# Patient Record
Sex: Female | Born: 1935 | Race: White | Hispanic: No | Marital: Married | Smoking: Former smoker
Health system: Southern US, Community
[De-identification: ages and names within clinical notes are randomized; demographics above are authoritative.]

## PROBLEM LIST (undated history)

## (undated) DIAGNOSIS — K8020 Calculus of gallbladder without cholecystitis without obstruction: Secondary | ICD-10-CM

## (undated) DIAGNOSIS — K219 Gastro-esophageal reflux disease without esophagitis: Secondary | ICD-10-CM

## (undated) DIAGNOSIS — M1990 Unspecified osteoarthritis, unspecified site: Secondary | ICD-10-CM

## (undated) DIAGNOSIS — K449 Diaphragmatic hernia without obstruction or gangrene: Secondary | ICD-10-CM

## (undated) DIAGNOSIS — E119 Type 2 diabetes mellitus without complications: Secondary | ICD-10-CM

## (undated) DIAGNOSIS — E785 Hyperlipidemia, unspecified: Secondary | ICD-10-CM

## (undated) DIAGNOSIS — I214 Non-ST elevation (NSTEMI) myocardial infarction: Secondary | ICD-10-CM

## (undated) DIAGNOSIS — K5790 Diverticulosis of intestine, part unspecified, without perforation or abscess without bleeding: Secondary | ICD-10-CM

## (undated) DIAGNOSIS — H539 Unspecified visual disturbance: Secondary | ICD-10-CM

## (undated) DIAGNOSIS — I493 Ventricular premature depolarization: Secondary | ICD-10-CM

## (undated) DIAGNOSIS — I10 Essential (primary) hypertension: Secondary | ICD-10-CM

## (undated) DIAGNOSIS — T7840XA Allergy, unspecified, initial encounter: Secondary | ICD-10-CM

## (undated) HISTORY — DX: Ventricular premature depolarization: I49.3

## (undated) HISTORY — PX: EYE SURGERY: SHX253

## (undated) HISTORY — DX: Unspecified osteoarthritis, unspecified site: M19.90

## (undated) HISTORY — DX: Allergy, unspecified, initial encounter: T78.40XA

## (undated) HISTORY — DX: Calculus of gallbladder without cholecystitis without obstruction: K80.20

## (undated) HISTORY — DX: Non-ST elevation (NSTEMI) myocardial infarction: I21.4

## (undated) HISTORY — PX: CHOLECYSTECTOMY: SHX55

## (undated) HISTORY — DX: Gastro-esophageal reflux disease without esophagitis: K21.9

## (undated) HISTORY — DX: Unspecified visual disturbance: H53.9

## (undated) HISTORY — PX: BREAST SURGERY: SHX581

## (undated) HISTORY — PX: OTHER SURGICAL HISTORY: SHX169

## (undated) HISTORY — DX: Hyperlipidemia, unspecified: E78.5

## (undated) HISTORY — DX: Essential (primary) hypertension: I10

## (undated) HISTORY — PX: FRACTURE SURGERY: SHX138

---

## 1998-01-10 ENCOUNTER — Other Ambulatory Visit: Admission: RE | Admit: 1998-01-10 | Discharge: 1998-01-10 | Payer: Self-pay | Admitting: Obstetrics & Gynecology

## 2001-12-10 ENCOUNTER — Other Ambulatory Visit: Admission: RE | Admit: 2001-12-10 | Discharge: 2001-12-10 | Payer: Self-pay | Admitting: Obstetrics & Gynecology

## 2002-12-16 ENCOUNTER — Other Ambulatory Visit: Admission: RE | Admit: 2002-12-16 | Discharge: 2002-12-16 | Payer: Self-pay | Admitting: Obstetrics & Gynecology

## 2008-10-01 DIAGNOSIS — K8020 Calculus of gallbladder without cholecystitis without obstruction: Secondary | ICD-10-CM

## 2008-10-01 HISTORY — DX: Calculus of gallbladder without cholecystitis without obstruction: K80.20

## 2011-11-12 DIAGNOSIS — E559 Vitamin D deficiency, unspecified: Secondary | ICD-10-CM | POA: Diagnosis not present

## 2011-11-12 DIAGNOSIS — R7309 Other abnormal glucose: Secondary | ICD-10-CM | POA: Diagnosis not present

## 2011-11-12 DIAGNOSIS — Z5181 Encounter for therapeutic drug level monitoring: Secondary | ICD-10-CM | POA: Diagnosis not present

## 2011-11-12 DIAGNOSIS — E7800 Pure hypercholesterolemia, unspecified: Secondary | ICD-10-CM | POA: Diagnosis not present

## 2011-11-12 DIAGNOSIS — Z79899 Other long term (current) drug therapy: Secondary | ICD-10-CM | POA: Diagnosis not present

## 2011-11-15 DIAGNOSIS — Z0000 Encounter for general adult medical examination without abnormal findings: Secondary | ICD-10-CM | POA: Diagnosis not present

## 2011-11-15 DIAGNOSIS — I10 Essential (primary) hypertension: Secondary | ICD-10-CM | POA: Diagnosis not present

## 2011-11-15 DIAGNOSIS — Z1211 Encounter for screening for malignant neoplasm of colon: Secondary | ICD-10-CM | POA: Diagnosis not present

## 2011-11-15 DIAGNOSIS — Z1339 Encounter for screening examination for other mental health and behavioral disorders: Secondary | ICD-10-CM | POA: Diagnosis not present

## 2011-11-15 DIAGNOSIS — Z124 Encounter for screening for malignant neoplasm of cervix: Secondary | ICD-10-CM | POA: Diagnosis not present

## 2011-11-15 DIAGNOSIS — L570 Actinic keratosis: Secondary | ICD-10-CM | POA: Diagnosis not present

## 2011-11-15 DIAGNOSIS — E119 Type 2 diabetes mellitus without complications: Secondary | ICD-10-CM | POA: Diagnosis not present

## 2011-11-15 DIAGNOSIS — Z1331 Encounter for screening for depression: Secondary | ICD-10-CM | POA: Diagnosis not present

## 2011-11-15 DIAGNOSIS — N959 Unspecified menopausal and perimenopausal disorder: Secondary | ICD-10-CM | POA: Diagnosis not present

## 2011-11-15 DIAGNOSIS — E7800 Pure hypercholesterolemia, unspecified: Secondary | ICD-10-CM | POA: Diagnosis not present

## 2011-12-24 DIAGNOSIS — H2510 Age-related nuclear cataract, unspecified eye: Secondary | ICD-10-CM | POA: Diagnosis not present

## 2011-12-24 DIAGNOSIS — H25019 Cortical age-related cataract, unspecified eye: Secondary | ICD-10-CM | POA: Diagnosis not present

## 2011-12-24 DIAGNOSIS — H524 Presbyopia: Secondary | ICD-10-CM | POA: Diagnosis not present

## 2012-05-12 DIAGNOSIS — Z5181 Encounter for therapeutic drug level monitoring: Secondary | ICD-10-CM | POA: Diagnosis not present

## 2012-05-12 DIAGNOSIS — E7800 Pure hypercholesterolemia, unspecified: Secondary | ICD-10-CM | POA: Diagnosis not present

## 2012-05-12 DIAGNOSIS — Z79899 Other long term (current) drug therapy: Secondary | ICD-10-CM | POA: Diagnosis not present

## 2012-05-12 DIAGNOSIS — E119 Type 2 diabetes mellitus without complications: Secondary | ICD-10-CM | POA: Diagnosis not present

## 2012-05-13 DIAGNOSIS — E119 Type 2 diabetes mellitus without complications: Secondary | ICD-10-CM | POA: Diagnosis not present

## 2012-05-13 DIAGNOSIS — E7800 Pure hypercholesterolemia, unspecified: Secondary | ICD-10-CM | POA: Diagnosis not present

## 2012-05-13 DIAGNOSIS — I10 Essential (primary) hypertension: Secondary | ICD-10-CM | POA: Diagnosis not present

## 2012-05-13 DIAGNOSIS — M549 Dorsalgia, unspecified: Secondary | ICD-10-CM | POA: Diagnosis not present

## 2012-06-11 DIAGNOSIS — Q838 Other congenital malformations of breast: Secondary | ICD-10-CM | POA: Diagnosis not present

## 2012-06-12 DIAGNOSIS — N63 Unspecified lump in unspecified breast: Secondary | ICD-10-CM | POA: Diagnosis not present

## 2012-06-12 DIAGNOSIS — N644 Mastodynia: Secondary | ICD-10-CM | POA: Diagnosis not present

## 2012-08-15 DIAGNOSIS — Z23 Encounter for immunization: Secondary | ICD-10-CM | POA: Diagnosis not present

## 2012-08-18 DIAGNOSIS — H1045 Other chronic allergic conjunctivitis: Secondary | ICD-10-CM | POA: Diagnosis not present

## 2012-09-10 DIAGNOSIS — R319 Hematuria, unspecified: Secondary | ICD-10-CM | POA: Diagnosis not present

## 2012-09-10 DIAGNOSIS — J209 Acute bronchitis, unspecified: Secondary | ICD-10-CM | POA: Diagnosis not present

## 2012-09-12 DIAGNOSIS — N63 Unspecified lump in unspecified breast: Secondary | ICD-10-CM | POA: Diagnosis not present

## 2012-12-26 DIAGNOSIS — H524 Presbyopia: Secondary | ICD-10-CM | POA: Diagnosis not present

## 2012-12-26 DIAGNOSIS — H1045 Other chronic allergic conjunctivitis: Secondary | ICD-10-CM | POA: Diagnosis not present

## 2012-12-26 DIAGNOSIS — H2510 Age-related nuclear cataract, unspecified eye: Secondary | ICD-10-CM | POA: Diagnosis not present

## 2013-02-17 DIAGNOSIS — M169 Osteoarthritis of hip, unspecified: Secondary | ICD-10-CM | POA: Diagnosis not present

## 2013-02-17 DIAGNOSIS — M25559 Pain in unspecified hip: Secondary | ICD-10-CM | POA: Diagnosis not present

## 2013-02-17 DIAGNOSIS — M549 Dorsalgia, unspecified: Secondary | ICD-10-CM | POA: Diagnosis not present

## 2013-02-17 DIAGNOSIS — M1610 Unilateral primary osteoarthritis, unspecified hip: Secondary | ICD-10-CM | POA: Diagnosis not present

## 2013-02-17 DIAGNOSIS — I10 Essential (primary) hypertension: Secondary | ICD-10-CM | POA: Diagnosis not present

## 2013-03-06 DIAGNOSIS — M25559 Pain in unspecified hip: Secondary | ICD-10-CM | POA: Diagnosis not present

## 2013-03-06 DIAGNOSIS — M810 Age-related osteoporosis without current pathological fracture: Secondary | ICD-10-CM | POA: Diagnosis not present

## 2013-03-06 DIAGNOSIS — M169 Osteoarthritis of hip, unspecified: Secondary | ICD-10-CM | POA: Diagnosis not present

## 2013-03-06 DIAGNOSIS — Z1231 Encounter for screening mammogram for malignant neoplasm of breast: Secondary | ICD-10-CM | POA: Diagnosis not present

## 2013-03-06 DIAGNOSIS — M949 Disorder of cartilage, unspecified: Secondary | ICD-10-CM | POA: Diagnosis not present

## 2013-03-10 DIAGNOSIS — R7309 Other abnormal glucose: Secondary | ICD-10-CM | POA: Diagnosis not present

## 2013-03-10 DIAGNOSIS — E7800 Pure hypercholesterolemia, unspecified: Secondary | ICD-10-CM | POA: Diagnosis not present

## 2013-03-10 DIAGNOSIS — Z5181 Encounter for therapeutic drug level monitoring: Secondary | ICD-10-CM | POA: Diagnosis not present

## 2013-03-10 DIAGNOSIS — Z79899 Other long term (current) drug therapy: Secondary | ICD-10-CM | POA: Diagnosis not present

## 2013-03-11 DIAGNOSIS — M1990 Unspecified osteoarthritis, unspecified site: Secondary | ICD-10-CM | POA: Diagnosis not present

## 2013-03-11 DIAGNOSIS — E7800 Pure hypercholesterolemia, unspecified: Secondary | ICD-10-CM | POA: Diagnosis not present

## 2013-03-11 DIAGNOSIS — I10 Essential (primary) hypertension: Secondary | ICD-10-CM | POA: Diagnosis not present

## 2013-03-11 DIAGNOSIS — K219 Gastro-esophageal reflux disease without esophagitis: Secondary | ICD-10-CM | POA: Diagnosis not present

## 2013-03-11 DIAGNOSIS — E119 Type 2 diabetes mellitus without complications: Secondary | ICD-10-CM | POA: Diagnosis not present

## 2013-07-20 DIAGNOSIS — M542 Cervicalgia: Secondary | ICD-10-CM | POA: Diagnosis not present

## 2013-07-20 DIAGNOSIS — S064X9A Epidural hemorrhage with loss of consciousness of unspecified duration, initial encounter: Secondary | ICD-10-CM | POA: Diagnosis not present

## 2013-07-20 DIAGNOSIS — S0003XA Contusion of scalp, initial encounter: Secondary | ICD-10-CM | POA: Diagnosis not present

## 2013-07-20 DIAGNOSIS — Y92009 Unspecified place in unspecified non-institutional (private) residence as the place of occurrence of the external cause: Secondary | ICD-10-CM | POA: Diagnosis not present

## 2013-07-20 DIAGNOSIS — T1490XA Injury, unspecified, initial encounter: Secondary | ICD-10-CM | POA: Diagnosis not present

## 2013-07-20 DIAGNOSIS — T07XXXA Unspecified multiple injuries, initial encounter: Secondary | ICD-10-CM | POA: Diagnosis not present

## 2013-07-20 DIAGNOSIS — S0993XA Unspecified injury of face, initial encounter: Secondary | ICD-10-CM | POA: Diagnosis not present

## 2013-07-20 DIAGNOSIS — S0100XA Unspecified open wound of scalp, initial encounter: Secondary | ICD-10-CM | POA: Diagnosis not present

## 2013-07-20 DIAGNOSIS — S0990XA Unspecified injury of head, initial encounter: Secondary | ICD-10-CM | POA: Diagnosis not present

## 2013-07-20 DIAGNOSIS — Z23 Encounter for immunization: Secondary | ICD-10-CM | POA: Diagnosis not present

## 2013-07-20 DIAGNOSIS — R51 Headache: Secondary | ICD-10-CM | POA: Diagnosis not present

## 2013-07-20 DIAGNOSIS — W010XXA Fall on same level from slipping, tripping and stumbling without subsequent striking against object, initial encounter: Secondary | ICD-10-CM | POA: Diagnosis not present

## 2013-07-29 DIAGNOSIS — R51 Headache: Secondary | ICD-10-CM | POA: Diagnosis not present

## 2013-07-29 DIAGNOSIS — R42 Dizziness and giddiness: Secondary | ICD-10-CM | POA: Diagnosis not present

## 2013-10-20 DIAGNOSIS — H52209 Unspecified astigmatism, unspecified eye: Secondary | ICD-10-CM | POA: Diagnosis not present

## 2013-10-20 DIAGNOSIS — R51 Headache: Secondary | ICD-10-CM | POA: Diagnosis not present

## 2013-10-20 DIAGNOSIS — H04129 Dry eye syndrome of unspecified lacrimal gland: Secondary | ICD-10-CM | POA: Diagnosis not present

## 2013-10-20 DIAGNOSIS — H259 Unspecified age-related cataract: Secondary | ICD-10-CM | POA: Diagnosis not present

## 2013-11-30 ENCOUNTER — Telehealth: Payer: Self-pay | Admitting: Family Medicine

## 2013-11-30 NOTE — Telephone Encounter (Signed)
Pt here with her husband today for his visit- gave her a zostavax rx as well

## 2013-12-02 ENCOUNTER — Encounter: Payer: Self-pay | Admitting: Internal Medicine

## 2013-12-02 ENCOUNTER — Ambulatory Visit: Payer: Medicare Other

## 2013-12-02 ENCOUNTER — Ambulatory Visit (INDEPENDENT_AMBULATORY_CARE_PROVIDER_SITE_OTHER): Payer: Medicare Other | Admitting: Internal Medicine

## 2013-12-02 VITALS — BP 100/80 | HR 69 | Temp 97.6°F | Resp 16 | Ht 60.0 in | Wt 145.6 lb

## 2013-12-02 DIAGNOSIS — R1012 Left upper quadrant pain: Secondary | ICD-10-CM

## 2013-12-02 DIAGNOSIS — M25559 Pain in unspecified hip: Secondary | ICD-10-CM | POA: Diagnosis not present

## 2013-12-02 DIAGNOSIS — M25552 Pain in left hip: Secondary | ICD-10-CM | POA: Insufficient documentation

## 2013-12-02 DIAGNOSIS — Z139 Encounter for screening, unspecified: Secondary | ICD-10-CM | POA: Diagnosis not present

## 2013-12-02 DIAGNOSIS — M542 Cervicalgia: Secondary | ICD-10-CM | POA: Diagnosis not present

## 2013-12-02 DIAGNOSIS — R079 Chest pain, unspecified: Secondary | ICD-10-CM

## 2013-12-02 DIAGNOSIS — R5381 Other malaise: Secondary | ICD-10-CM

## 2013-12-02 DIAGNOSIS — R0602 Shortness of breath: Secondary | ICD-10-CM

## 2013-12-02 DIAGNOSIS — Z0000 Encounter for general adult medical examination without abnormal findings: Secondary | ICD-10-CM

## 2013-12-02 DIAGNOSIS — M25519 Pain in unspecified shoulder: Secondary | ICD-10-CM | POA: Diagnosis not present

## 2013-12-02 DIAGNOSIS — M419 Scoliosis, unspecified: Secondary | ICD-10-CM | POA: Insufficient documentation

## 2013-12-02 DIAGNOSIS — R0781 Pleurodynia: Secondary | ICD-10-CM

## 2013-12-02 DIAGNOSIS — M4120 Other idiopathic scoliosis, site unspecified: Secondary | ICD-10-CM | POA: Diagnosis not present

## 2013-12-02 DIAGNOSIS — J301 Allergic rhinitis due to pollen: Secondary | ICD-10-CM | POA: Insufficient documentation

## 2013-12-02 DIAGNOSIS — M25512 Pain in left shoulder: Secondary | ICD-10-CM

## 2013-12-02 DIAGNOSIS — R5383 Other fatigue: Secondary | ICD-10-CM | POA: Diagnosis not present

## 2013-12-02 DIAGNOSIS — R32 Unspecified urinary incontinence: Secondary | ICD-10-CM | POA: Diagnosis not present

## 2013-12-02 DIAGNOSIS — R413 Other amnesia: Secondary | ICD-10-CM

## 2013-12-02 LAB — POCT URINALYSIS DIPSTICK
Bilirubin, UA: NEGATIVE
GLUCOSE UA: NEGATIVE
KETONES UA: NEGATIVE
Nitrite, UA: NEGATIVE
PROTEIN UA: NEGATIVE
SPEC GRAV UA: 1.015
Urobilinogen, UA: 0.2
pH, UA: 6

## 2013-12-02 LAB — COMPLETE METABOLIC PANEL WITH GFR
ALBUMIN: 4.3 g/dL (ref 3.5–5.2)
ALT: 25 U/L (ref 0–35)
AST: 23 U/L (ref 0–37)
Alkaline Phosphatase: 67 U/L (ref 39–117)
BUN: 16 mg/dL (ref 6–23)
CO2: 30 meq/L (ref 19–32)
Calcium: 9.6 mg/dL (ref 8.4–10.5)
Chloride: 102 mEq/L (ref 96–112)
Creat: 0.57 mg/dL (ref 0.50–1.10)
GFR, EST NON AFRICAN AMERICAN: 86 mL/min
GLUCOSE: 93 mg/dL (ref 70–99)
POTASSIUM: 4.3 meq/L (ref 3.5–5.3)
Sodium: 139 mEq/L (ref 135–145)
Total Bilirubin: 0.4 mg/dL (ref 0.2–1.2)
Total Protein: 7.4 g/dL (ref 6.0–8.3)

## 2013-12-02 LAB — CBC
HEMATOCRIT: 43.3 % (ref 36.0–46.0)
Hemoglobin: 14.8 g/dL (ref 12.0–15.0)
MCH: 32.8 pg (ref 26.0–34.0)
MCHC: 34.2 g/dL (ref 30.0–36.0)
MCV: 96 fL (ref 78.0–100.0)
Platelets: 261 10*3/uL (ref 150–400)
RBC: 4.51 MIL/uL (ref 3.87–5.11)
RDW: 13 % (ref 11.5–15.5)
WBC: 6.3 10*3/uL (ref 4.0–10.5)

## 2013-12-02 LAB — LIPID PANEL
CHOLESTEROL: 264 mg/dL — AB (ref 0–200)
HDL: 57 mg/dL (ref >39)
LDL Cholesterol: 171 mg/dL — ABNORMAL HIGH (ref 0–99)
Total CHOL/HDL Ratio: 4.6 Ratio
Triglycerides: 178 mg/dL — ABNORMAL HIGH (ref <150)
VLDL: 36 mg/dL (ref 0–40)

## 2013-12-02 NOTE — Progress Notes (Addendum)
Subjective:    Patient ID: Jaime Mooney, female    DOB: [birth year], 80 y.o.   MRN: [id number] This chart was scribed for Jaime Lin, MD by Jaime Mooney, ED Scribe. This patient was seen in room 27 and the patient's care was started at 1:23 PM.  Chief Complaint  Patient presents with   Annual Exam   HPI Jaime Mooney is a 80 y.o. female Pt presents for annual exam. !st OV in many years  [place]. She asks if her records have been transferred over. Her and her husband moved to help care for daughter-in-law who has stage 4 lung cancer. She states the move has been stressful, they have been moved into house they did not like. She states in the process of the move she lost her medications/vitamins and begin feeling terrible--now rest and feels much better.  She would like to have a cholesterol check. She states her levels are high, inherited high cholesterol. Red yeast rice.  She had cardiac catherization 4 years ago, with normal results, at time when chest pain led to cholecystectomy. She has h/o GERDas well-contr w/ tums. She reports intermittent left lateral chest pain since then. She takes TUMS as needed for her pain. She denies knowing when her last CXR was done.   She states she is probably due for her colonoscopy, cannot remember if she is UTD.   She has h/o aspirated breast lump-benign.    C/O generalized body pain, states she feels sluggish and is SOB. See ROS.  She reports neck pain, with associated stiffness, onset 6 months ago after slipping on wet grass and hitting her head. Her neck pain radiates to her left shoulder, and rolling over, repositioning exacerbates her pain. She reports h/o scoliosis.   She reports left hip pain, has trouble walking, cannot walk extended distance. She reports associated left knee popping. [place]   She reports post-nasal drip, sinus  pressure, and congestion, onset since having sinus surgery. She reports associated light-headedness, dizziness. She states that her light-headedness is sometimes brought on when she looks down to walk down a step. She reports seasonal allergies. She states her voice has changed significantly from what it used to be, states she cannot sing like she used to.   She reports intermittent urinary urgency, onset a few years ago. She reports mild urinary incontinence sometimes when she has the urgency. She denies h/o uterus and bladder surgery. She states she does not drink a lot of water, states her skin is constantly dry. She denies frequently getting thirsty, and states her kidneys seem to be working fine.  PCP - No primary provider on file. C. depression screen/C. risk for falls  Prior to Admission medications   Medication Sig Start Date End Date Taking? Authorizing Provider  OVER THE COUNTER MEDICATION OTC Red Yeast Rice 600 mg 2 tabs twice daily   Yes Historical Provider, MD   Review of Systems  Genitourinary: Negative for dysuria.  Musculoskeletal: Arthralgias: left hip. Myalgias: left shoulder.  Skin:       + for dryness.   Psychiatric/Behavioral:       + for stress  Review of Systems  Constitutional: Positive for activity change and fatigue.  HENT: Positive for facial swelling and sinus pressure.lots of AR and PND consta sin surger yrs ago   Eyes: Positive for photophobia.  Respiratory: Positive for shortness  of breath(long hx same?etio)   Cardiovascular: Negative.   Gastrointestinal: Positive for abdominal pain.  Genitourinary: Positive for urgency and frequency. occas urge incontinence Musculoskeletal: Positive for arthralgias, back pain, gait problem, neck pain and neck stiffness.  Allergic/Immunologic: Positive for environmental allergies.  Neurological: Positive for speech difficulty(word finding) and light-headedness(chg posit-nosync).  Hematological: Negative.     Psychiatric/Behavioral: The patient is nervous/anxious but not enough to consid meds-has lots of caretaking to do with ill husb(CHF) and [place]-stg4lung ca   BP 100/80   Pulse 69   Temp(Src) 97.6 F (36.4 C) (Oral)   Resp 16   Ht 5' (1.524 m)   Wt 145 lb 9.6 oz (66.044 kg)   BMI 28.44 kg/m2   SpO2 98%     Objective:   Physical Exam  Nursing note and vitals reviewed. Constitutional: She is oriented to person, place, and time. She appears well-developed and well-nourished. No distress.  HENT:  Head: Normocephalic and atraumatic.  Right Ear: External ear normal.  Left Ear: External ear normal.  Nose: Nose normal.  Mouth/Throat: Oropharynx is clear and moist.  Eyes: Conjunctivae and EOM are normal. Pupils are equal, round, and reactive to light.  Neck: Neck supple. No JVD present. No thyromegaly present.  Neck with mild discomfort on extension felt in the left trapezius/otherwise good range of motion No carotid bruit  Cardiovascular: Normal rate, regular rhythm, normal heart sounds and intact distal pulses.   No murmur heard. Pulmonary/Chest: Effort normal and breath sounds normal. No respiratory distress. She has no wheezes. She has no rales. She exhibits tenderness. Right breast exhibits no mass, no skin change and no tenderness. Left breast exhibits no mass, no skin change and no tenderness. Breasts are symmetrical.   Complaining of pain over the left anterior ribs w/ palpation  Abdominal: Soft. Bowel sounds are normal. She exhibits no distension and no mass. There is tenderness. There is no rebound and no guarding.  Tender in the left upper quadrant to palpation without a discernible mass//ribs are prominent there secondary to scoliosis  Musculoskeletal: Normal range of motion. She exhibits tenderness. She exhibits no edema.  Marked thorac0-lumbar scoliosis Marked kyphosis Tender over the left trapezius and the left deltoid Pain in the left shoulder with external rotation and  abduction against resistance but otherwise range of motion fairly good Left hip with some restricted external rotation due to pain But overall fairly good range of motion with tenderness over the iliotibial band/greater trochanter Gait is without limp    Lymphadenopathy:    She has no cervical adenopathy.  Neurological: She is alert and oriented to person, place, and time. She has normal reflexes. No cranial nerve deficit.  Skin: Skin is warm and dry.  Skin with several areas of dryness including intertrigo under breasts  Psychiatric: She has a normal mood and affect. Her behavior is normal. Judgment and thought content normal.   Results for orders placed in visit on 12/02/13  POCT URINALYSIS DIPSTICK      Result Value Ref Range   Color, UA yellow     Clarity, UA clear     Glucose, UA neg     Bilirubin, UA neg     Ketones, UA neg     Spec Grav, UA 1.015     Blood, UA trace     pH, UA 6.0     Protein, UA neg     Urobilinogen, UA 0.2     Nitrite, UA neg     Leukocytes, UA  small (1+)    [facility] reading (PRIMARY) by  Dr. Laney Pastor note acute pulmonary abnormalities/marked kyphoscoliosis      Assessment & Plan:  Routine general medical examination at a health care facility for annual medicare initial wellness- Plan: Lipid panel, TSH, Vitamin B12, CBC, IFOBT POC (occult bld, rslt in office), POCT UA - Microscopic Only, POCT urinalysis dipstick, DG Chest 2 View, COMPLETE METABOLIC PANEL WITH GFR See screening issues  Urinary incontinence/urge incontinence - Plan: POCT UA - Microscopic Only, POCT urinalysis dipstick  She wants to wait until this is more problematic before considering urological intervention  Left hip pain Left shoulder pain Neck pain Scoliosis /kyphosis  Set up For an orthopedic evaluation for intervention as she will need physical therapy with conditioning and  injections most likely  LUQ abdominal pain/ left anterior rib pain--metabolic profile/chest x-ray  SOB  (shortness of breath) ? Deconditioning since this is more of a chronic problem and she has had a normal cardiac  catheterization  Memory loss -she demonstrates no memory issues on exam and I think her word finding as a symptom of her  Stress///ck b12  Fatigue - Plan:  TSH, CBC   Allergic rhinitis due to pollen   She will get her old records which includes a recent CT scan of her neck and her most recent colonoscopy along with her immunization history and the results of her breast nodule aspiration. Will have advan direc as well. Labs incl hemoccult.  I have completed the patient encounter in its entirety as documented by the scribe, with editing by me where necessary. Robert P. Laney Pastor, M.D.

## 2013-12-02 NOTE — Progress Notes (Signed)
   Subjective:    Patient ID: Jaime Mooney, female    DOB: Jul 17, 1929, 80 y.o.   MRN: [id number]  HPI    Review of Systems  Constitutional: Positive for activity change and fatigue.  HENT: Positive for facial swelling and sinus pressure.   Eyes: Positive for photophobia.  Respiratory: Positive for shortness of breath.   Cardiovascular: Negative.   Gastrointestinal: Positive for abdominal pain.  Genitourinary: Positive for urgency and frequency.  Musculoskeletal: Positive for arthralgias, back pain, gait problem, neck pain and neck stiffness.  Skin: Positive for rash.  Allergic/Immunologic: Positive for environmental allergies.  Neurological: Positive for speech difficulty and light-headedness.  Hematological: Negative.   Psychiatric/Behavioral: The patient is nervous/anxious.        Objective:   Physical Exam        Assessment & Plan:  I have reviewed and agree with documentation. Robert P. Laney Pastor, M.D.

## 2013-12-03 LAB — TSH: TSH: 0.932 u[IU]/mL (ref 0.350–4.500)

## 2013-12-03 LAB — VITAMIN B12: Vitamin B-12: 605 pg/mL (ref 211–911)

## 2013-12-04 ENCOUNTER — Encounter: Payer: Self-pay | Admitting: Internal Medicine

## 2013-12-04 LAB — IFOBT (OCCULT BLOOD): IFOBT: NEGATIVE

## 2013-12-14 NOTE — Progress Notes (Signed)
She was charged for physical and [id number], for the multiple other problems, you had asked about new patient, but we could not do this. See me if you have questions, I have discussed this with Wyline Copas already

## 2014-05-31 ENCOUNTER — Ambulatory Visit (INDEPENDENT_AMBULATORY_CARE_PROVIDER_SITE_OTHER): Payer: Medicare Other

## 2014-05-31 DIAGNOSIS — Z23 Encounter for immunization: Secondary | ICD-10-CM

## 2014-10-18 ENCOUNTER — Ambulatory Visit (INDEPENDENT_AMBULATORY_CARE_PROVIDER_SITE_OTHER): Payer: Medicare Other

## 2014-10-18 ENCOUNTER — Encounter: Payer: Self-pay | Admitting: Family Medicine

## 2014-10-18 ENCOUNTER — Ambulatory Visit (INDEPENDENT_AMBULATORY_CARE_PROVIDER_SITE_OTHER): Payer: Medicare Other | Admitting: Family Medicine

## 2014-10-18 VITALS — BP 150/90 | HR 71 | Temp 97.6°F | Resp 16 | Ht 65.0 in | Wt 141.0 lb

## 2014-10-18 DIAGNOSIS — M25552 Pain in left hip: Secondary | ICD-10-CM

## 2014-10-18 DIAGNOSIS — IMO0001 Reserved for inherently not codable concepts without codable children: Secondary | ICD-10-CM

## 2014-10-18 DIAGNOSIS — R9431 Abnormal electrocardiogram [ECG] [EKG]: Secondary | ICD-10-CM | POA: Diagnosis not present

## 2014-10-18 DIAGNOSIS — Z5181 Encounter for therapeutic drug level monitoring: Secondary | ICD-10-CM

## 2014-10-18 DIAGNOSIS — R319 Hematuria, unspecified: Secondary | ICD-10-CM | POA: Diagnosis not present

## 2014-10-18 DIAGNOSIS — M542 Cervicalgia: Secondary | ICD-10-CM

## 2014-10-18 DIAGNOSIS — R030 Elevated blood-pressure reading, without diagnosis of hypertension: Secondary | ICD-10-CM

## 2014-10-18 DIAGNOSIS — I10 Essential (primary) hypertension: Secondary | ICD-10-CM

## 2014-10-18 LAB — COMPREHENSIVE METABOLIC PANEL
ALT: 22 U/L (ref 0–35)
AST: 21 U/L (ref 0–37)
Albumin: 3.9 g/dL (ref 3.5–5.2)
Alkaline Phosphatase: 69 U/L (ref 39–117)
BUN: 16 mg/dL (ref 6–23)
CALCIUM: 9.7 mg/dL (ref 8.4–10.5)
CHLORIDE: 103 meq/L (ref 96–112)
CO2: 27 mEq/L (ref 19–32)
Creat: 0.53 mg/dL (ref 0.50–1.10)
Glucose, Bld: 96 mg/dL (ref 70–99)
POTASSIUM: 4.6 meq/L (ref 3.5–5.3)
Sodium: 142 mEq/L (ref 135–145)
Total Bilirubin: 0.5 mg/dL (ref 0.2–1.2)
Total Protein: 7.2 g/dL (ref 6.0–8.3)

## 2014-10-18 LAB — POCT UA - MICROSCOPIC ONLY
Casts, Ur, LPF, POC: NEGATIVE
Crystals, Ur, HPF, POC: NEGATIVE
MUCUS UA: POSITIVE
YEAST UA: NEGATIVE

## 2014-10-18 LAB — CBC
HEMATOCRIT: 43.9 % (ref 36.0–46.0)
Hemoglobin: 14.8 g/dL (ref 12.0–15.0)
MCH: 33 pg (ref 26.0–34.0)
MCHC: 33.7 g/dL (ref 30.0–36.0)
MCV: 98 fL (ref 78.0–100.0)
MPV: 10.6 fL (ref 8.6–12.4)
Platelets: 234 10*3/uL (ref 150–400)
RBC: 4.48 MIL/uL (ref 3.87–5.11)
RDW: 12.4 % (ref 11.5–15.5)
WBC: 6.4 10*3/uL (ref 4.0–10.5)

## 2014-10-18 LAB — POCT URINALYSIS DIPSTICK
BILIRUBIN UA: NEGATIVE
GLUCOSE UA: NEGATIVE
Ketones, UA: NEGATIVE
LEUKOCYTES UA: NEGATIVE
Nitrite, UA: NEGATIVE
Protein, UA: NEGATIVE
Spec Grav, UA: <=1.005
Urobilinogen, UA: 0.2
pH, UA: 5.5

## 2014-10-18 MED ORDER — CEPHALEXIN 500 MG PO CAPS: 500.0000 mg | ORAL_CAPSULE | Freq: Two times a day (BID) | ORAL | Status: DC

## 2014-10-18 NOTE — Progress Notes (Addendum)
[facility] [address] [place] [address] [phone]  Date:  10/18/2014   Name:  Jaime Mooney   DOB:  05/13/1929   MRN:  [id number]  PCP:  No primary care provider on file.    Chief Complaint: Hip pain; Neck pain; and Redness in urine in A.M.   History of Present Illness:  Jaime Mooney is a 81 y.o. very pleasant female patient who presents with the following:  Here today with a few concerns.  I have seen her when she visited clinic with her husband in the past. She has a history of problems with her left hip.  It has bothered her for about 10 years.  She had x-rays 3 years ago but they looked ok- she was told that she has "rheumatism."  At that time she was told that she did not need a hip replacement Any activity gives her a lot of hip pain.  She is better with rest She did see ortho for her hip but this was when they were living in [place].    She also has had some pain in her neck for about 2.5 years since she had a fall.  This has bothered her more over the last few weeks to months.  Sometimes she has a hard time turning her head when she is driving.   She has been using aspirin for her pain but it does not really seem to be helping and it made her ears ring.  She reduced her dose of aspirin and this resolved.  She has not tried anything else for her pain, does not use tylenol because of a possible history of elevate liver function.  She has also noted that her "urine is very, very discolored, it looks like it could have some blood in it."  Her urine will appear dark first thing in the am and then will clear up.  She has noticed this for "15 years" and was seen by a urologist in the past.  She is not quite sure what the result of this evaluation was.    She did have a heart cath approx 4-5 years ago in [place].   This looked ok per her recollection, she cannot remember the name of her cardiologist or of the clinic.  She denies any CP but is  sometimes aware of heart palpitations   BP Readings from Last 3 Encounters:  10/18/14 170/78  12/02/13 100/80     Patient Active Problem List   Diagnosis Date Noted  . Urinary incontinence 12/02/2013  . Left hip pain 12/02/2013  . Left shoulder pain 12/02/2013  . Scoliosis 12/02/2013  . Allergic rhinitis due to pollen 12/02/2013    Past Medical History  Diagnosis Date  . Allergy   . Arthritis   . GERD (gastroesophageal reflux disease)     Past Surgical History  Procedure Laterality Date  . Breast surgery    . Cholecystectomy    . Fracture surgery    . Right breast surgery 2012    . Left breast surgery 20 yrs ago    . Nose jfracture 16 yeras ago    . Gallbladder surgery 5 years ago      History  Substance Use Topics  . Smoking status: Former Research scientist (life sciences)  . Smokeless tobacco: Not on file  . Alcohol Use: Yes     Comment: 7 drinks per week    Family History  Problem Relation Age of Onset  .  Cancer Mother   . Cancer Father   . Diabetes Father   . Heart disease Father   . Stroke Father   . Cancer Sister   . Heart disease Maternal Grandmother   . Heart disease Maternal Grandfather   . Heart disease Paternal Grandmother   . Diabetes Paternal Grandfather     Allergies  Allergen Reactions  . Epinephrine     Causes heart palpatations  . Lisinopril     Facial swelling and tongue swelling  . Prednisone     Facial swelling    Medication list has been reviewed and updated.  Current Outpatient Prescriptions on File Prior to Visit  Medication Sig Dispense Refill  . OVER THE COUNTER MEDICATION OTC Red Yeast Rice 600 mg 2 tabs twice daily     No current facility-administered medications on file prior to visit.    Review of Systems:  As per HPI- otherwise negative.   Physical Examination: Filed Vitals:   10/18/14 1125  BP: 170/78  Pulse: 71  Temp: 97.6 F (36.4 C)  Resp: 16   Filed Vitals:   10/18/14 1125  Height: 5\' 5"  (1.651 m)  Weight: 141 lb  (63.957 kg)   Body mass index is 23.46 kg/(m^2). Ideal Body Weight: Weight in (lb) to have BMI = 25: 149.9  GEN: WDWN, NAD, Non-toxic, A & O x 3, looks well HEENT: Atraumatic, Normocephalic. Neck supple. No masses, No LAD. Ears and Nose: No external deformity. CV: RRR interspersed with frequent irregular beats- likely PVCs but cannot rule- out a fib, No M/G/R. No JVD. No thrill. No extra heart sounds. PULM: CTA B, no wheezes, crackles, rhonchi. No retractions. No resp. distress. No accessory muscle use. ABD: S, NT, ND EXTR: No c/c/e NEURO Normal gait.  PSYCH: Normally interactive. Conversant. Not depressed or anxious appearing.  Calm demeanor.  Neck: restricted ROM to rotation right and left, normal flexion and extension Left hip: she notes pain in the hip joint with flexion of the joint, ok with rotation.  ROM is normal   [facility] reading (PRIMARY) by  Dr. Lorelei Pont. Cervical spine: degenerative change mostly at C5/6.  Also known history of significant TL scoliosis  Left hip: loss of joint space   CLINICAL DATA: Initial encounter for neck pain and stiffness. No trauma history submitted.  EXAM: CERVICAL SPINE 4+ VIEWS  COMPARISON: None.  FINDINGS: The lateral view images through the mid C7 level. Prevertebral soft tissues are within normal limits. Maintenance of vertebral body height and alignment. Degenerate disc disease at C5-6 is moderate to marked. Lateral masses and odontoid process obscured on open-mouth view. The first oblique image is suboptimal secondary to positioning (inclusion of the upper chest). Facets grossly aligned. Right-sided neural foraminal narrowing at C5-6 secondary to uncovertebral joint hypertrophy. Contralateral oblique view is nondiagnostic. The film labeled oblique view is likely attempted AP view and is also centered over the upper thoracic spine. Probable spinal curvature convex left.  IMPRESSION: Moderate-to-marked degradation secondary to  technique limitations.  Spondylosis without gross acute abnormality throughout the imaged cervical spine.     DG HIP W/ PELVIS 2-3V*L*  COMPARISON: None.  FINDINGS: Pelvic ring is intact. Mild degenerative changes are noted in the lumbar spine. No acute fracture or dislocation is seen. No gross soft tissue abnormality is noted.  IMPRESSION: No acute abnormality noted   Results for orders placed or performed in visit on 10/18/14  POCT urinalysis dipstick  Result Value Ref Range   Color, UA yellow  Clarity, UA clear    Glucose, UA neg    Bilirubin, UA neg    Ketones, UA neg    Spec Grav, UA <=1.005    Blood, UA small    pH, UA 5.5    Protein, UA neg    Urobilinogen, UA 0.2    Nitrite, UA neg    Leukocytes, UA Negative   POCT UA - Microscopic Only  Result Value Ref Range   WBC, Ur, HPF, POC 3-4    RBC, urine, microscopic 4-5    Bacteria, U Microscopic 2+    Mucus, UA pos    Epithelial cells, urine per micros 0-2    Crystals, Ur, HPF, POC neg    Casts, Ur, LPF, POC neg    Yeast, UA neg    EKG:  Frequent PVCs, strain pattern, 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 No old EKG for comparison.  She denies any CP  Assessment and Plan: Hematuria - Plan: Urine culture, POCT urinalysis dipstick, POCT UA - Microscopic Only, cephALEXin (KEFLEX) 500 MG capsule  Left hip pain - Plan: DG HIP UNILAT WITH PELVIS 2-3 VIEWS LEFT  Neck pain - Plan: DG Cervical Spine Complete  Medication monitoring encounter - Plan: CBC, Comprehensive metabolic panel  Elevated BP - Plan: EKG 12-Lead, Ambulatory referral to Cardiology  Abnormal EKG - Plan: Ambulatory referral to Cardiology  I spent over 45 minutes face to face with her today going over he concerns.   She does appear to have a possible UTI.  Start on keflex and await urine culture  Hip pain and neck pain. So far she has just tried aspirin for her pain.  She has not tried any other NSAID or tylenol. She reports a history of  ?elevated LFTs with tylenol.  She is not interested in trying tramadol. Suggested that she try aleve but will need to reduce her asa to 81mg  and watch her BP Did EKG to ensure she was in SR as I noted frequent irregularity on exam.  She is in sinus but does have possible evidence of an old infarct.  She denies any CP so will arrange outpt cardiology follow-up She does not normally have HTN.  She is able to check her BP at home and will be in touch with an update  Signed Lamar Blinks, MD  Called 1/20 to go over her labs and x-ray report.  She reports that her BP is running 154/78, and 170/89 are her home readings.  She would like to start some medication which I agree with, will start norvasc.  Let her know that her hip films were read as negative and that her urine culture was also negative.  She can stop her abx.  Will have her come in for a repeat UA/micro as she did have microhematuria with a negative culture.  She agrees with plan.     Results for orders placed or performed in visit on 10/18/14  Urine culture  Result Value Ref Range   Colony Count NO GROWTH    Organism ID, Bacteria NO GROWTH   CBC  Result Value Ref Range   WBC 6.4 4.0 - 10.5 K/uL   RBC 4.48 3.87 - 5.11 MIL/uL   Hemoglobin 14.8 12.0 - 15.0 g/dL   HCT 43.9 36.0 - 46.0 %   MCV 98.0 78.0 - 100.0 fL   MCH 33.0 26.0 - 34.0 pg   MCHC 33.7 30.0 - 36.0 g/dL   RDW 12.4 11.5 - 15.5 %   Platelets  234 150 - 400 K/uL   MPV 10.6 8.6 - 12.4 fL  Comprehensive metabolic panel  Result Value Ref Range   Sodium 142 135 - 145 mEq/L   Potassium 4.6 3.5 - 5.3 mEq/L   Chloride 103 96 - 112 mEq/L   CO2 27 19 - 32 mEq/L   Glucose, Bld 96 70 - 99 mg/dL   BUN 16 6 - 23 mg/dL   Creat 0.53 0.50 - 1.10 mg/dL   Total Bilirubin 0.5 0.2 - 1.2 mg/dL   Alkaline Phosphatase 69 39 - 117 U/L   AST 21 0 - 37 U/L   ALT 22 0 - 35 U/L   Total Protein 7.2 6.0 - 8.3 g/dL   Albumin 3.9 3.5 - 5.2 g/dL   Calcium 9.7 8.4 - 10.5 mg/dL  POCT urinalysis  dipstick  Result Value Ref Range   Color, UA yellow    Clarity, UA clear    Glucose, UA neg    Bilirubin, UA neg    Ketones, UA neg    Spec Grav, UA <=1.005    Blood, UA small    pH, UA 5.5    Protein, UA neg    Urobilinogen, UA 0.2    Nitrite, UA neg    Leukocytes, UA Negative   POCT UA - Microscopic Only  Result Value Ref Range   WBC, Ur, HPF, POC 3-4    RBC, urine, microscopic 4-5    Bacteria, U Microscopic 2+    Mucus, UA pos    Epithelial cells, urine per micros 0-2    Crystals, Ur, HPF, POC neg    Casts, Ur, LPF, POC neg    Yeast, UA neg

## 2014-10-18 NOTE — Patient Instructions (Addendum)
We are going to treat you for a possible UTI with the keflex antibiotic   I will culture your urine also and will be in touch with you You might try taking ibuprofen or aleve instead of aspirin for your hip and neck pain- however in this case reduce your aspirin to just 81 mg a day.   Take your aspirin at least 2 hours before or after any NSAID medication so the aspirin will still have its cardioprotective effect  Let me know if your joints do not feel better soon- if they do not we can consider other medication or orthopedist referral  I will be in touch with your labs Please check your BP at home and let me know if it continues to run over 140/85 I will refer you to cardiology to make sure all is ok with your heart.  If you do start having chest pain or have other concerns let me know.

## 2014-10-19 LAB — URINE CULTURE
COLONY COUNT: NO GROWTH
Organism ID, Bacteria: NO GROWTH

## 2014-10-20 ENCOUNTER — Encounter: Payer: Self-pay | Admitting: Family Medicine

## 2014-10-20 MED ORDER — AMLODIPINE BESYLATE 5 MG PO TABS: ORAL_TABLET | ORAL | Status: DC

## 2014-11-02 ENCOUNTER — Telehealth: Payer: Self-pay | Admitting: Family Medicine

## 2014-11-02 ENCOUNTER — Telehealth: Payer: Self-pay

## 2014-11-02 NOTE — Telephone Encounter (Signed)
Pt returning a missed call. Please advise. CB # [id number]

## 2014-11-02 NOTE — Telephone Encounter (Signed)
Spoke with pt, she states she had swelling of her lips on Friday and she quit taking it. She has been keeping record of her blood pressure readings: She states she feels ok now. 1/22 1/2 tablet 158/78 1/23 1/2 tablet 145/80 1/24 142/87 1/25 140/74 1/26 138/86 1/27 154/81 1/28 148/81 1/29 122/73 1/30 156/82 [id number]/81275/[id number] 10/01/14 137/78 10/02/14 138/75

## 2014-11-02 NOTE — Telephone Encounter (Signed)
I called pt to ask her how her BP medication was doing with her BP. Left message for pt to call back.

## 2014-11-02 NOTE — Telephone Encounter (Signed)
Called to check on her- LMOM. Please let me know how her BP is with her medication.  Please give [place] a call

## 2014-11-02 NOTE — Telephone Encounter (Signed)
Called her- she states that she is feeling much better.  She has stopped taking her BP medication and her BP is running around 140/80, sometimes a little bit higher or lower.  She is scheduled to see cardiology in a couple of weeks. At this time we will not use another BP medication.  She still does plan to come in for a recheck urine

## 2014-11-08 ENCOUNTER — Other Ambulatory Visit (INDEPENDENT_AMBULATORY_CARE_PROVIDER_SITE_OTHER): Payer: Medicare Other

## 2014-11-08 DIAGNOSIS — R319 Hematuria, unspecified: Secondary | ICD-10-CM

## 2014-11-08 LAB — POCT URINALYSIS DIPSTICK
Bilirubin, UA: NEGATIVE
GLUCOSE UA: NEGATIVE
Ketones, UA: NEGATIVE
NITRITE UA: NEGATIVE
PH UA: 6.5
Protein, UA: NEGATIVE
SPEC GRAV UA: 1.015
Urobilinogen, UA: 0.2

## 2014-11-08 LAB — POCT UA - MICROSCOPIC ONLY
Bacteria, U Microscopic: NEGATIVE
Casts, Ur, LPF, POC: NEGATIVE
Crystals, Ur, HPF, POC: NEGATIVE
MUCUS UA: NEGATIVE
Yeast, UA: NEGATIVE

## 2014-11-13 ENCOUNTER — Encounter: Payer: Self-pay | Admitting: Family Medicine

## 2014-11-15 ENCOUNTER — Encounter: Payer: Self-pay | Admitting: Cardiology

## 2014-11-15 DIAGNOSIS — I493 Ventricular premature depolarization: Secondary | ICD-10-CM | POA: Insufficient documentation

## 2014-11-15 DIAGNOSIS — R9431 Abnormal electrocardiogram [ECG] [EKG]: Secondary | ICD-10-CM | POA: Insufficient documentation
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
## 2014-11-16 ENCOUNTER — Encounter: Payer: Self-pay | Admitting: Cardiology

## 2014-11-16 ENCOUNTER — Ambulatory Visit (INDEPENDENT_AMBULATORY_CARE_PROVIDER_SITE_OTHER): Payer: Medicare Other | Admitting: Cardiology

## 2014-11-16 VITALS — BP 130/68 | HR 68 | Ht 65.0 in | Wt 141.0 lb

## 2014-11-16 DIAGNOSIS — I493 Ventricular premature depolarization: Secondary | ICD-10-CM | POA: Diagnosis not present

## 2014-11-16 DIAGNOSIS — R0602 Shortness of breath: Secondary | ICD-10-CM

## 2014-11-16 DIAGNOSIS — R079 Chest pain, unspecified: Secondary | ICD-10-CM

## 2014-11-16 DIAGNOSIS — R9431 Abnormal electrocardiogram [ECG] [EKG]: Secondary | ICD-10-CM | POA: Diagnosis not present

## 2014-11-16 NOTE — Patient Instructions (Signed)
Your physician recommends that you continue on your current medications as directed. Please refer to the Current Medication list given to you today.]  Your physician has requested that you have an echocardiogram. Echocardiography is a painless test that uses sound waves to create images of your heart. It provides your doctor with information about the size and shape of your heart and how well your heart's chambers and valves are working. This procedure takes approximately one hour. There are no restrictions for this procedure.  Your physician has requested that you have a lexiscan myoview. For further information please visit [email or web address]. Please follow instruction sheet, as given.  Your physician recommends that you schedule a follow-up appointment AS NEEDED with Dr. Radford Pax pending your test results.

## 2014-11-22 ENCOUNTER — Ambulatory Visit (HOSPITAL_COMMUNITY): Payer: Medicare Other | Attending: Cardiovascular Disease | Admitting: Radiology

## 2014-11-22 DIAGNOSIS — R079 Chest pain, unspecified: Secondary | ICD-10-CM | POA: Diagnosis not present

## 2014-11-22 DIAGNOSIS — R0602 Shortness of breath: Secondary | ICD-10-CM | POA: Diagnosis not present

## 2014-11-22 NOTE — Progress Notes (Signed)
Echocardiogram performed.  

## 2014-11-25 ENCOUNTER — Ambulatory Visit (HOSPITAL_COMMUNITY): Payer: Medicare Other | Attending: Cardiology | Admitting: Radiology

## 2014-11-25 DIAGNOSIS — R0602 Shortness of breath: Secondary | ICD-10-CM

## 2014-11-25 DIAGNOSIS — R079 Chest pain, unspecified: Secondary | ICD-10-CM | POA: Diagnosis not present

## 2014-11-25 DIAGNOSIS — R9431 Abnormal electrocardiogram [ECG] [EKG]: Secondary | ICD-10-CM | POA: Diagnosis present

## 2014-11-25 MED ORDER — REGADENOSON 0.4 MG/5ML IV SOLN
0.4000 mg | Freq: Once | INTRAVENOUS | Status: AC
  Administered 2014-11-25: 0.4 mg via INTRAVENOUS

## 2014-11-25 MED ORDER — TECHNETIUM TC 99M SESTAMIBI GENERIC - CARDIOLITE
33.0000 | Freq: Once | INTRAVENOUS | Status: AC | PRN
  Administered 2014-11-25: 33 via INTRAVENOUS

## 2014-11-25 MED ORDER — TECHNETIUM TC 99M SESTAMIBI GENERIC - CARDIOLITE
11.0000 | Freq: Once | INTRAVENOUS | Status: AC | PRN
  Administered 2014-11-25: 11 via INTRAVENOUS

## 2014-11-25 NOTE — Progress Notes (Signed)
[facility] [place], [place] [address] [phone]    Cardiology Nuclear Med Study  Jaime Mooney is a 81 y.o. female     MRN : [id number]     DOB: [birth year]  Procedure Date: 11/25/2014  Nuclear Med Background Indication for Stress Test:  Evaluation for Ischemia and Abnormal EKG History:  MPI: [place] NL per pt Cardiac Risk Factors: N/A  Symptoms:  Chest Pain and SOB   Nuclear Pre-Procedure Caffeine/Decaff Intake:  None NPO After: 8:00pm   Lungs:  clear O2 Sat: 97% on room air. IV 0.9% NS with Angio Cath:  22g  IV Site: R Antecubital  IV Started by:  Crissie Figures, RN  Chest Size (in):  38 Cup Size: B  Height: 5\' 1"  (1.549 m)  Weight:  142 lb (64.411 kg)  BMI:  Body mass index is 26.84 kg/(m^2). Tech Comments:  N/A    Nuclear Med Study 1 or 2 day study: 1 day  Stress Test Type:  Lexiscan  Reading MD: N/A  Order Authorizing Provider:  Fransico Him, MD  Resting Radionuclide: Technetium 56m Sestamibi  Resting Radionuclide Dose: 11.0 mCi   Stress Radionuclide:  Technetium 84m Sestamibi  Stress Radionuclide Dose: 33.0 mCi           Stress Protocol Rest HR: 73 Stress HR: 86  Rest BP: 139/97 Stress BP: 162/66  Exercise Time (min): n/a METS: n/a   Predicted Max HR: 136 bpm % Max HR: 63.24 bpm Rate Pressure Product: 13932   Dose of Adenosine (mg):  n/a Dose of Lexiscan: 0.4 mg  Dose of Atropine (mg): n/a Dose of Dobutamine: n/a mcg/kg/min (at max HR)  Stress Test Technologist: Perrin Maltese, EMT-P  Nuclear Technologist:  Earl Many, CNMT     Rest Procedure:  Myocardial perfusion imaging was performed at rest 45 minutes following the intravenous administration of Technetium 43m Sestamibi. Rest ECG: Sinus rhythm, 71 bpm, PVC, nonspecific ST-T wave changes.  Stress Procedure:  The patient received IV Lexiscan 0.4 mg over 15-seconds.  Technetium 48m Sestamibi injected at 30-seconds. This patient had chest and neck  tightness with the Lexiscan injection. Quantitative spect images were obtained after a 45 minute delay. Stress ECG: No significant change from baseline ECG  QPS Raw Data Images:  Normal; no motion artifact; normal heart/lung ratio. Stress Images:  Normal homogeneous uptake in all areas of the myocardium. Rest Images:  Normal homogeneous uptake in all areas of the myocardium. Subtraction (SDS):  No evidence of ischemia. Transient Ischemic Dilatation (Normal <1.22):  0.87 Lung/Heart Ratio (Normal <0.45):  0.38  Quantitative Gated Spect Images QGS EDV:  59 ml QGS ESV:  22 ml  Impression Exercise Capacity:  Lexiscan with no exercise. BP Response:  Normal blood pressure response. Clinical Symptoms:  Typical symptoms with Lexiscan ECG Impression:  No significant ST segment change suggestive of ischemia. Comparison with Prior Nuclear Study: No images to compare  Overall Impression:  Low risk stress nuclear study with no ischemia identified.  LV Ejection Fraction: 62%.  LV Wall Motion:  NL LV Function; NL Wall Motion  Candee Furbish, MD

## 2014-12-13 ENCOUNTER — Encounter: Payer: Self-pay | Admitting: Family Medicine

## 2014-12-13 ENCOUNTER — Ambulatory Visit (INDEPENDENT_AMBULATORY_CARE_PROVIDER_SITE_OTHER): Payer: Medicare Other | Admitting: Family Medicine

## 2014-12-13 VITALS — BP 130/60 | HR 62 | Temp 97.7°F | Resp 16 | Ht 60.5 in | Wt 141.2 lb

## 2014-12-13 DIAGNOSIS — R3129 Other microscopic hematuria: Secondary | ICD-10-CM

## 2014-12-13 DIAGNOSIS — M542 Cervicalgia: Secondary | ICD-10-CM

## 2014-12-13 DIAGNOSIS — I493 Ventricular premature depolarization: Secondary | ICD-10-CM

## 2014-12-13 DIAGNOSIS — R312 Other microscopic hematuria: Secondary | ICD-10-CM

## 2014-12-13 DIAGNOSIS — M47812 Spondylosis without myelopathy or radiculopathy, cervical region: Secondary | ICD-10-CM | POA: Diagnosis not present

## 2014-12-13 NOTE — Patient Instructions (Addendum)
Let me know if you change your mind about doing further imaging of your neck- we can certainly do a CT scan to get a closer look.  I will refer you to a urologist to re-evaluate the small amount of blood in your urine.   My understanding is that your heart looks fine, you simply have these early beats or "PVCs."  I will touch base with Dr. Radford Pax to make sure there is nothing else that she wanted to tell you.   Take care!

## 2014-12-13 NOTE — Progress Notes (Signed)
[facility] [address] [place] [address] [phone]  Date:  12/13/2014   Name:  Jaime Mooney   DOB:  1929-08-25   MRN:  [id number]  PCP:  No primary care provider on file.    Chief Complaint: Follow-up; Hypertension; Neck Pain; and headache   History of Present Illness:  Jaime Mooney is a 81 y.o. very pleasant female patient who presents with the following:  Recently seen by cardiology and had a myoview which was normal, normal echo also.   She was not sure if anything further was needed as far as her irregular heartbeats/   She fell again about 3 weeks ago.   She tripped while rushing to get the phone.  She banged her right hip, right elbow, and pulled her right shoulder, and hit the side of her face.  This exacerbated her baseline neck pain which has been present for a couple of years.   She will have headaches when she has to drive a lot, she thinks this is due to her neck.  She notes difficulty rotating her neck to each side.  She is not using any medication for her neck pain at this time.    She has had urology [place] of [place].  She reports that no cause was found for this; this eval was done 6-7 years ago.  I noted 4-5 rbc on her UA in January, 0-1 in February.  She is a former smoker.    Patient Active Problem List   Diagnosis Date Noted  . Abnormal EKG 11/15/2014  . PVC (premature ventricular contraction)   . Urinary incontinence 12/02/2013  . Left hip pain 12/02/2013  . Left shoulder pain 12/02/2013  . Scoliosis 12/02/2013  . Allergic rhinitis due to pollen 12/02/2013    Past Medical History  Diagnosis Date  . Allergy   . Arthritis   . GERD (gastroesophageal reflux disease)   . PVC (premature ventricular contraction)     Past Surgical History  Procedure Laterality Date  . Breast surgery    . Cholecystectomy    . Fracture surgery    . Right breast surgery 2012    .  Left breast surgery 20 yrs ago    . Nose jfracture 16 yeras ago    . Gallbladder surgery 5 years ago      History  Substance Use Topics  . Smoking status: Former Research scientist (life sciences)  . Smokeless tobacco: Not on file  . Alcohol Use: Yes     Comment: 7 drinks per week    Family History  Problem Relation Age of Onset  . Cancer Mother   . Cancer Father   . Diabetes Father   . Heart disease Father   . Stroke Father   . Cancer Sister   . Heart disease Maternal Grandmother   . Heart disease Maternal Grandfather   . Heart disease Paternal Grandmother   . Diabetes Paternal Grandfather     Allergies  Allergen Reactions  . Epinephrine     Causes heart palpatations  . Lisinopril     Facial swelling and tongue swelling  . Norvasc [Amlodipine Besylate] Swelling  . Prednisone     Facial swelling    Medication list has been reviewed and updated.  Current Outpatient Prescriptions on File Prior to Visit  Medication Sig Dispense Refill  . aspirin 81 MG tablet Take 81 mg by mouth daily.    Marland Kitchen  calcium elemental as carbonate (BARIATRIC TUMS ULTRA) 400 MG tablet Chew 1,000 mg by mouth 3 (three) times daily.    . Cholecalciferol (VITAMIN D3) 2000 UNITS TABS Take 2,000 Units by mouth daily.    . Magnesium 250 MG TABS Take 250 mg by mouth daily.    . Multiple Vitamins-Minerals (CENTRUM SILVER PO) Take by mouth.    . Omega-3 Fatty Acids (FISH OIL) 1000 MG CAPS Take 1,000 mg by mouth daily.    Marland Kitchen OVER THE COUNTER MEDICATION OTC Red Yeast Rice 600 mg 2 tabs twice daily    . vitamin A 8000 UNIT capsule Take 8,000 Units by mouth daily.    . vitamin E 200 UNIT capsule Take 200 Units by mouth daily.     No current facility-administered medications on file prior to visit.    Review of Systems:  As per HPI- otherwise negative.   Physical Examination: Filed Vitals:   12/13/14 0840  BP: 130/60  Pulse: 62  Temp: 97.7 F (36.5 C)  Resp: 16   Filed Vitals:   12/13/14 0840  Height: 5' 0.5" (1.537 m)   Weight: 141 lb 3.2 oz (64.048 kg)   Body mass index is 27.11 kg/(m^2). Ideal Body Weight: Weight in (lb) to have BMI = 25: 129.9  GEN: WDWN, NAD, Non-toxic, A & O x 3, looks well HEENT: Atraumatic, Normocephalic. Neck supple. No masses, No LAD.  Bilateral TM wnl, oropharynx normal.  PEERL,EOMI.   She has decreased ROM of her neck with rotation to the left and right. No bony TTP Ears and Nose: No external deformity. CV: RRR with some irregular beats c/w PVCs, No M/G/R. No JVD. No thrill. No extra heart sounds. PULM: CTA B, no wheezes, crackles, rhonchi. No retractions. No resp. distress. No accessory muscle use.Marland Kitchen EXTR: No c/c/e NEURO Normal gait. Normal BUE strength and ROM  PSYCH: Normally interactive. Conversant. Not depressed or anxious appearing.  Calm demeanor.    Assessment and Plan: Microhematuria - Plan: Ambulatory referral to Urology  Premature ventricular contraction  Neck pain  Osteoarthritis of neck  Reassured that I do not think anything further is needed as far as her heart, but I will message Dr. Radford Pax and make sure She had unexplained microhematuria earlier this year.  This resolved on recheck but as she is a former smoker and it has been several years since her last urology evaluation.  She is willing to be evaluated again Encouraged re-imaging of her neck since she fell again, either with x-ray or CT.  She declines this for now but will let me know if she changes her mind.    Signed Lamar Blinks, MD

## 2015-01-18 DIAGNOSIS — R310 Gross hematuria: Secondary | ICD-10-CM | POA: Diagnosis not present

## 2015-01-18 DIAGNOSIS — R312 Other microscopic hematuria: Secondary | ICD-10-CM | POA: Diagnosis not present

## 2015-01-25 DIAGNOSIS — N289 Disorder of kidney and ureter, unspecified: Secondary | ICD-10-CM | POA: Diagnosis not present

## 2015-01-25 DIAGNOSIS — K5730 Diverticulosis of large intestine without perforation or abscess without bleeding: Secondary | ICD-10-CM | POA: Diagnosis not present

## 2015-01-25 DIAGNOSIS — R310 Gross hematuria: Secondary | ICD-10-CM | POA: Diagnosis not present

## 2015-01-28 DIAGNOSIS — H5203 Hypermetropia, bilateral: Secondary | ICD-10-CM | POA: Diagnosis not present

## 2015-01-28 DIAGNOSIS — H04123 Dry eye syndrome of bilateral lacrimal glands: Secondary | ICD-10-CM | POA: Diagnosis not present

## 2015-01-28 DIAGNOSIS — H25813 Combined forms of age-related cataract, bilateral: Secondary | ICD-10-CM | POA: Diagnosis not present

## 2015-01-28 DIAGNOSIS — H524 Presbyopia: Secondary | ICD-10-CM | POA: Diagnosis not present

## 2015-02-07 DIAGNOSIS — R310 Gross hematuria: Secondary | ICD-10-CM | POA: Diagnosis not present

## 2015-02-07 DIAGNOSIS — D414 Neoplasm of uncertain behavior of bladder: Secondary | ICD-10-CM | POA: Diagnosis not present

## 2015-02-07 DIAGNOSIS — N281 Cyst of kidney, acquired: Secondary | ICD-10-CM | POA: Diagnosis not present

## 2015-02-15 ENCOUNTER — Other Ambulatory Visit: Payer: Self-pay | Admitting: Urology

## 2015-02-22 DIAGNOSIS — H25812 Combined forms of age-related cataract, left eye: Secondary | ICD-10-CM | POA: Diagnosis not present

## 2015-02-22 DIAGNOSIS — H25042 Posterior subcapsular polar age-related cataract, left eye: Secondary | ICD-10-CM | POA: Diagnosis not present

## 2015-02-22 DIAGNOSIS — H25012 Cortical age-related cataract, left eye: Secondary | ICD-10-CM | POA: Diagnosis not present

## 2015-02-22 DIAGNOSIS — H2512 Age-related nuclear cataract, left eye: Secondary | ICD-10-CM | POA: Diagnosis not present

## 2015-04-13 ENCOUNTER — Ambulatory Visit (HOSPITAL_BASED_OUTPATIENT_CLINIC_OR_DEPARTMENT_OTHER): Admission: RE | Admit: 2015-04-13 | Payer: Medicare Other | Source: Ambulatory Visit | Admitting: Urology

## 2015-04-13 ENCOUNTER — Encounter (HOSPITAL_BASED_OUTPATIENT_CLINIC_OR_DEPARTMENT_OTHER): Admission: RE | Payer: Self-pay | Source: Ambulatory Visit

## 2015-04-13 SURGERY — CYSTOSCOPY, WITH BIOPSY
Anesthesia: General

## 2015-05-10 DIAGNOSIS — H25031 Anterior subcapsular polar age-related cataract, right eye: Secondary | ICD-10-CM | POA: Diagnosis not present

## 2015-05-10 DIAGNOSIS — H25041 Posterior subcapsular polar age-related cataract, right eye: Secondary | ICD-10-CM | POA: Diagnosis not present

## 2015-05-10 DIAGNOSIS — H2511 Age-related nuclear cataract, right eye: Secondary | ICD-10-CM | POA: Diagnosis not present

## 2015-05-10 DIAGNOSIS — H25811 Combined forms of age-related cataract, right eye: Secondary | ICD-10-CM | POA: Diagnosis not present

## 2015-06-09 DIAGNOSIS — H01005 Unspecified blepharitis left lower eyelid: Secondary | ICD-10-CM | POA: Diagnosis not present

## 2015-06-22 DIAGNOSIS — H16103 Unspecified superficial keratitis, bilateral: Secondary | ICD-10-CM | POA: Diagnosis not present

## 2015-06-22 DIAGNOSIS — H04123 Dry eye syndrome of bilateral lacrimal glands: Secondary | ICD-10-CM | POA: Diagnosis not present

## 2015-06-22 DIAGNOSIS — H01001 Unspecified blepharitis right upper eyelid: Secondary | ICD-10-CM | POA: Diagnosis not present

## 2015-06-22 DIAGNOSIS — H01004 Unspecified blepharitis left upper eyelid: Secondary | ICD-10-CM | POA: Diagnosis not present

## 2015-06-28 ENCOUNTER — Ambulatory Visit (INDEPENDENT_AMBULATORY_CARE_PROVIDER_SITE_OTHER): Payer: Medicare Other | Admitting: Family Medicine

## 2015-06-28 ENCOUNTER — Ambulatory Visit (INDEPENDENT_AMBULATORY_CARE_PROVIDER_SITE_OTHER): Payer: Medicare Other

## 2015-06-28 VITALS — BP 138/70 | HR 74 | Temp 97.8°F | Resp 17 | Ht 61.5 in | Wt 149.0 lb

## 2015-06-28 DIAGNOSIS — M79671 Pain in right foot: Secondary | ICD-10-CM

## 2015-06-28 DIAGNOSIS — B372 Candidiasis of skin and nail: Secondary | ICD-10-CM | POA: Diagnosis not present

## 2015-06-28 DIAGNOSIS — M7741 Metatarsalgia, right foot: Secondary | ICD-10-CM

## 2015-06-28 MED ORDER — NYSTATIN 100000 UNIT/GM EX POWD: CUTANEOUS | Status: DC

## 2015-06-28 NOTE — Progress Notes (Signed)
Subjective:  This chart was scribed for Jaime Mooney, by Jaime Mooney, at [facility].  This patient was seen in room 3 and the patient's care was started at 10:31 AM.    Patient ID: Jaime Mooney, female    DOB: 07-06-1929, 81 y.o.   MRN: [id number] Chief Complaint  Patient presents with  . Foot Pain    right foot, comes and goes, x 10 days, started with ankle swelling   . Rash    under breast, x 8 months     HPI  HPI Comments: Jaime Mooney is a 81 y.o. female who presents to the [facility] complaining of a rash under her breast and right foot pain.  Patients PCP is Jaime Mooney,JESSICA, Mooney  Breast rash: Patient presents with a constant "flaky like" rash under her breast onset for about a year with associated symptoms of itching.  Patient states that her rash was diagnosed with a yeast infection which has been treated by Dr. Edilia Mooney in the past.  She has used an over the counter milk of magnesia but states that it did not give her any relief.   Right heel/ankle pain: The pain started ten days ago with swelling on the outer part of her ankle and now has moved to the inside part of her ankle and heel.  She was not worried about the swelling initially as it has happened in the past but was concerned after the pain developed and was worse 4-5 days ago as it felt like "someone was sticking knives in it". She has also had slight pain in her leg.  Patient took two [place] yesterday which she says helped her and states that it has been better the past two days. She has not tried tylenol and thought that she was not allowed to (but is unsure of why).  She denies any recent injury but has a history of plantar fasciitis and gout.  Denies calf pain or history of calf pain, chest pain, no history of blood clots, no recent travel or change in activity.  She has has one plantar fascia steroid injections in the past but unsure if it was in the heel which she is  complaining about today.      Patient Active Problem List   Diagnosis Date Noted  . Abnormal EKG 11/15/2014  . PVC (premature ventricular contraction)   . Urinary incontinence 12/02/2013  . Left hip pain 12/02/2013  . Left shoulder pain 12/02/2013  . Scoliosis 12/02/2013  . Allergic rhinitis due to pollen 12/02/2013   Past Medical History  Diagnosis Date  . Allergy   . Arthritis   . GERD (gastroesophageal reflux disease)   . PVC (premature ventricular contraction)    Past Surgical History  Procedure Laterality Date  . Breast surgery    . Cholecystectomy    . Fracture surgery    . Right breast surgery 2012    . Left breast surgery 20 yrs ago    . Nose jfracture 16 yeras ago    . Gallbladder surgery 5 years ago    . Eye surgery      cataracts bilateral    Allergies  Allergen Reactions  . Epinephrine     Causes heart palpatations  . Lisinopril     Facial swelling and tongue swelling  . Norvasc [Amlodipine Besylate] Swelling  . Prednisone     Facial swelling   Prior to Admission medications   Medication  Sig Start Date End Date Taking? Authorizing Julietta Batterman  aspirin 81 MG tablet Take 81 mg by mouth daily.   Yes Historical Alenna Russell, Mooney  calcium elemental as carbonate (BARIATRIC TUMS ULTRA) 400 MG tablet Chew 1,000 mg by mouth 3 (three) times daily.   Yes Historical Madonna Flegal, Mooney  Cholecalciferol (VITAMIN D3) 2000 UNITS TABS Take 2,000 Units by mouth daily.   Yes Historical Arieonna Medine, Mooney  Magnesium 250 MG TABS Take 250 mg by mouth daily.   Yes Historical Lamar Meter, Mooney  Multiple Vitamins-Minerals (CENTRUM SILVER PO) Take by mouth.   Yes Historical Lucila Klecka, Mooney  Omega-3 Fatty Acids (FISH OIL) 1000 MG CAPS Take 1,000 mg by mouth daily.   Yes Historical Vidur Knust, Mooney  OVER THE COUNTER MEDICATION OTC Red Yeast Rice 600 mg 2 tabs twice daily   Yes Historical Katniss Weedman, Mooney  Polyethyl Glycol-Propyl Glycol (SYSTANE OP) Apply to eye.   Yes Historical Aryannah Mohon, Mooney  vitamin A 8000 UNIT  capsule Take 8,000 Units by mouth daily.   Yes Historical Nelson Noone, Mooney  vitamin E 200 UNIT capsule Take 200 Units by mouth daily.   Yes Historical Maurico Perrell, Mooney   Social History   Social History  . Marital Status: Married    Spouse Name: N/A  . Number of Children: N/A  . Years of Education: N/A   Occupational History  . Unemployed    Social History Main Topics  . Smoking status: Former Research scientist (life sciences)  . Smokeless tobacco: Not on file  . Alcohol Use: Yes     Comment: 7 drinks per week  . Drug Use: No  . Sexual Activity: Not on file   Other Topics Concern  . Not on file   Social History Narrative   Married. Education: Other.      Review of Systems  Constitutional: Negative for fever and chills.  Eyes: Negative for pain, redness and itching.  Respiratory: Negative for cough and choking.   Cardiovascular: Negative for chest pain.  Gastrointestinal: Negative for nausea and vomiting.  Musculoskeletal: Positive for gait problem. Negative for neck pain and neck stiffness.  Skin: Positive for rash.       Objective:   Physical Exam  Constitutional: She is oriented to person, place, and time.  HENT:  Head: Normocephalic and atraumatic.  Eyes: Pupils are equal, round, and reactive to light.  Pulmonary/Chest: Effort normal.  Musculoskeletal:  Diffuse mild discomfort medial and lateral leg  No calf swelling Right ankle Pain free- full range of motion Achilles is non tender.  Retro calcaneal bursa is non tender.  Slight discomfort under her calcaneus with lateral squeeze Tender along the distal calcaneus.  She has some tenderness along the plantar metatarsal heads approximately 3rd and 4th  minimal discomfort at the plantar fascia.    Neurological: She is alert and oriented to person, place, and time.  Skin:  left breast skin fold, there is slight erythema with slight dry scaled appearance.   Right bright minimal erythema at the fold below the breast without any papules or  pustules, skin is intact.   Psychiatric: She has a normal mood and affect. Her behavior is normal.  Nursing note and vitals reviewed.   Filed Vitals:   06/28/15 0931  BP: 138/70  Pulse: 74  Temp: 97.8 F (36.6 C)  TempSrc: Oral  Resp: 17  Height: 5' 1.5" (1.562 m)  Weight: 149 lb (67.586 kg)  SpO2: 96%   [facility] (PRIMARY) x-ray report read by Dr. Carlota Raspberry:  Right foot: possible degenerative change  of the midfoot without apparent fracture. calcaneus with plantar heel spur and insertional calcific tendinosis at the achilles.  No apparent fracture or acute findings.      Assessment & Plan:   CLIFFIE GINGRAS is a 81 y.o. female Heel pain, right - Plan: DG Foot Complete Right  -Plantar spur with possible combination of plantar fasciitis and painful heel/fat pad syndrome. Recommended continued stretches, silicone or viscoelastic heel cushion, consider foot specialist if persistent.  Metatarsalgia of right foot - Plan: DG Foot Complete Right  -Tylenol, trial of shoe insert, return for recheck if persistent or can refer to orthopedics.  Candidal intertrigo - Plan: nystatin (MYCOSTATIN/NYSTOP) 100000 UNIT/GM POWD  -Minimal signs on exam, however prescribed nystatin if needed for persistent or worsening irritation, handout given.  Meds ordered this encounter  Medications  . Polyethyl Glycol-Propyl Glycol (SYSTANE OP)    Sig: Apply to eye.  . nystatin (MYCOSTATIN/NYSTOP) 100000 UNIT/GM POWD    Sig: Apply powder up to 3 times per day as needed to affected area up to 2 weeks    Dispense:  60 g    Refill:  0   Patient Instructions  Your foot pain may be a combination of metatarsalgia (or pain in the metatarsal bones of your foot), and painful heel that can be from a number of causes including plantar fasciitis.  I'm encouraged by the improving, but she can try the viscoelastic heel pad or insert underneath your heel for some cushioning, and the metatarsal cookies to take the pressure off the  ball of the foot. Tylenol is probably okay to take his because I do not see an allergy for this or reason you should not below take it based on your past medical history. If you do take Advil, use only as needed and try to avoid long-term use as this can affect your kidneys and heart. If your pain does not continue to improve this next week, let me know and I will be happy to refer you to an orthopedist or foot specialist.   Nystatin powder up to 3 times per day to the skin under the breasts, then once symptoms have improved, can stop using the powder, but try to allow air to that area and use powder as needed if symptoms recur.  Return to the clinic or go to the nearest emergency room if any of your symptoms worsen or new symptoms occur.   Intertrigo Intertrigo is a skin condition that occurs in between folds of skin in places on the body that rub together a lot and do not get much ventilation. It is caused by heat, moisture, friction, sweat retention, and lack of air circulation, which produces red, irritated patches and, sometimes, scaling or drainage. People who have diabetes, who are obese, or who have treatment with antibiotics are at increased risk for intertrigo. The most common sites for intertrigo to occur include:  The groin.  The breasts.  The armpits.  Folds of abdominal skin.  Webbed spaces between the fingers or toes. Intertrigo may be aggravated by:  Sweat.  Feces.  Yeast or bacteria that are present near skin folds.  Urine.  Vaginal discharge. HOME CARE INSTRUCTIONS  The following steps can be taken to reduce friction and keep the affected area cool and dry:  Expose skin folds to the air.  Keep deep skin folds separated with cotton or linen cloth. Avoid tight fitting clothing that could cause chafing.  Wear open-toed shoes or sandals to help reduce moisture  between the toes.  Apply absorbent powders to affected areas as directed by your caregiver.  Apply  over-the-counter barrier pastes, such as zinc oxide, as directed by your caregiver.  If you develop a fungal infection in the affected area, your caregiver may have you use antifungal creams. SEEK MEDICAL CARE IF:   The rash is not improving after 1 week of treatment.  The rash is getting worse (more red, more swollen, more painful, or spreading).  You have a fever or chills. MAKE SURE YOU:   Understand these instructions.  Will watch your condition.  Will get help right away if you are not doing well or get worse. Document Released: 09/17/2005 Document Revised: 12/10/2011 Document Reviewed: 03/02/2010 [facility] Patient Information 2015 [place], [place]. This information is not intended to replace advice given to you by your health care Ellayna Hilligoss. Make sure you discuss any questions you have with your health care Sundus Pete.    I personally performed the services described in this documentation, which was scribed in my presence. The recorded information has been reviewed and considered, and addended by me as needed.

## 2015-06-28 NOTE — Patient Instructions (Signed)
Your foot pain may be a combination of metatarsalgia (or pain in the metatarsal bones of your foot), and painful heel that can be from a number of causes including plantar fasciitis.  I'm encouraged by the improving, but she can try the viscoelastic heel pad or insert underneath your heel for some cushioning, and the metatarsal cookies to take the pressure off the ball of the foot. Tylenol is probably okay to take his because I do not see an allergy for this or reason you should not below take it based on your past medical history. If you do take Advil, use only as needed and try to avoid long-term use as this can affect your kidneys and heart. If your pain does not continue to improve this next week, let me know and I will be happy to refer you to an orthopedist or foot specialist.   Nystatin powder up to 3 times per day to the skin under the breasts, then once symptoms have improved, can stop using the powder, but try to allow air to that area and use powder as needed if symptoms recur.  Return to the clinic or go to the nearest emergency room if any of your symptoms worsen or new symptoms occur.   Intertrigo Intertrigo is a skin condition that occurs in between folds of skin in places on the body that rub together a lot and do not get much ventilation. It is caused by heat, moisture, friction, sweat retention, and lack of air circulation, which produces red, irritated patches and, sometimes, scaling or drainage. People who have diabetes, who are obese, or who have treatment with antibiotics are at increased risk for intertrigo. The most common sites for intertrigo to occur include:  The groin.  The breasts.  The armpits.  Folds of abdominal skin.  Webbed spaces between the fingers or toes. Intertrigo may be aggravated by:  Sweat.  Feces.  Yeast or bacteria that are present near skin folds.  Urine.  Vaginal discharge. HOME CARE INSTRUCTIONS  The following steps can be taken to  reduce friction and keep the affected area cool and dry:  Expose skin folds to the air.  Keep deep skin folds separated with cotton or linen cloth. Avoid tight fitting clothing that could cause chafing.  Wear open-toed shoes or sandals to help reduce moisture between the toes.  Apply absorbent powders to affected areas as directed by your caregiver.  Apply over-the-counter barrier pastes, such as zinc oxide, as directed by your caregiver.  If you develop a fungal infection in the affected area, your caregiver may have you use antifungal creams. SEEK MEDICAL CARE IF:   The rash is not improving after 1 week of treatment.  The rash is getting worse (more red, more swollen, more painful, or spreading).  You have a fever or chills. MAKE SURE YOU:   Understand these instructions.  Will watch your condition.  Will get help right away if you are not doing well or get worse. Document Released: 09/17/2005 Document Revised: 12/10/2011 Document Reviewed: 03/02/2010 [facility] Patient Information 2015 [place], [place]. This information is not intended to replace advice given to you by your health care provider. Make sure you discuss any questions you have with your health care provider.

## 2015-07-11 DIAGNOSIS — M79671 Pain in right foot: Secondary | ICD-10-CM | POA: Diagnosis not present

## 2015-08-08 DIAGNOSIS — M79671 Pain in right foot: Secondary | ICD-10-CM | POA: Diagnosis not present

## 2015-08-10 ENCOUNTER — Ambulatory Visit (INDEPENDENT_AMBULATORY_CARE_PROVIDER_SITE_OTHER): Payer: Medicare Other | Admitting: *Deleted

## 2015-08-10 DIAGNOSIS — Z23 Encounter for immunization: Secondary | ICD-10-CM

## 2015-08-23 DIAGNOSIS — M722 Plantar fascial fibromatosis: Secondary | ICD-10-CM | POA: Diagnosis not present

## 2015-08-30 DIAGNOSIS — M722 Plantar fascial fibromatosis: Secondary | ICD-10-CM | POA: Diagnosis not present

## 2015-09-01 DIAGNOSIS — M722 Plantar fascial fibromatosis: Secondary | ICD-10-CM | POA: Diagnosis not present

## 2015-09-06 DIAGNOSIS — M722 Plantar fascial fibromatosis: Secondary | ICD-10-CM | POA: Diagnosis not present

## 2015-09-09 DIAGNOSIS — M722 Plantar fascial fibromatosis: Secondary | ICD-10-CM | POA: Diagnosis not present

## 2015-09-13 DIAGNOSIS — M722 Plantar fascial fibromatosis: Secondary | ICD-10-CM | POA: Diagnosis not present

## 2015-09-16 DIAGNOSIS — M722 Plantar fascial fibromatosis: Secondary | ICD-10-CM | POA: Diagnosis not present

## 2015-10-06 DIAGNOSIS — H04123 Dry eye syndrome of bilateral lacrimal glands: Secondary | ICD-10-CM | POA: Diagnosis not present

## 2015-10-06 DIAGNOSIS — H01004 Unspecified blepharitis left upper eyelid: Secondary | ICD-10-CM | POA: Diagnosis not present

## 2015-10-06 DIAGNOSIS — Z961 Presence of intraocular lens: Secondary | ICD-10-CM | POA: Diagnosis not present

## 2015-10-06 DIAGNOSIS — H01001 Unspecified blepharitis right upper eyelid: Secondary | ICD-10-CM | POA: Diagnosis not present

## 2015-10-19 DIAGNOSIS — L821 Other seborrheic keratosis: Secondary | ICD-10-CM | POA: Diagnosis not present

## 2015-10-19 DIAGNOSIS — L814 Other melanin hyperpigmentation: Secondary | ICD-10-CM | POA: Diagnosis not present

## 2015-10-19 DIAGNOSIS — L304 Erythema intertrigo: Secondary | ICD-10-CM | POA: Diagnosis not present

## 2015-10-21 ENCOUNTER — Encounter: Payer: Self-pay | Admitting: Family Medicine

## 2015-10-26 ENCOUNTER — Encounter: Payer: Self-pay | Admitting: Family Medicine

## 2016-02-10 ENCOUNTER — Telehealth: Payer: Self-pay

## 2016-02-10 NOTE — Telephone Encounter (Signed)
Pre Visit call made to patient

## 2016-02-13 ENCOUNTER — Encounter: Payer: Self-pay | Admitting: Family Medicine

## 2016-02-13 ENCOUNTER — Ambulatory Visit (INDEPENDENT_AMBULATORY_CARE_PROVIDER_SITE_OTHER): Payer: Medicare Other | Admitting: Family Medicine

## 2016-02-13 ENCOUNTER — Other Ambulatory Visit (HOSPITAL_COMMUNITY)
Admission: RE | Admit: 2016-02-13 | Discharge: 2016-02-13 | Disposition: A | Discharge status: Home / Self Care | Payer: Medicare Other | Source: Ambulatory Visit | Attending: Family Medicine | Admitting: Family Medicine

## 2016-02-13 VITALS — BP 124/82 | HR 80 | Temp 97.5°F | Ht 62.0 in | Wt 150.4 lb

## 2016-02-13 DIAGNOSIS — Z13220 Encounter for screening for lipoid disorders: Secondary | ICD-10-CM | POA: Diagnosis not present

## 2016-02-13 DIAGNOSIS — Z0000 Encounter for general adult medical examination without abnormal findings: Secondary | ICD-10-CM | POA: Diagnosis not present

## 2016-02-13 DIAGNOSIS — N898 Other specified noninflammatory disorders of vagina: Secondary | ICD-10-CM

## 2016-02-13 DIAGNOSIS — Z23 Encounter for immunization: Secondary | ICD-10-CM | POA: Diagnosis not present

## 2016-02-13 DIAGNOSIS — E785 Hyperlipidemia, unspecified: Secondary | ICD-10-CM

## 2016-02-13 DIAGNOSIS — Z131 Encounter for screening for diabetes mellitus: Secondary | ICD-10-CM | POA: Diagnosis not present

## 2016-02-13 DIAGNOSIS — N760 Acute vaginitis: Secondary | ICD-10-CM | POA: Insufficient documentation

## 2016-02-13 DIAGNOSIS — Z5181 Encounter for therapeutic drug level monitoring: Secondary | ICD-10-CM

## 2016-02-13 DIAGNOSIS — M4120 Other idiopathic scoliosis, site unspecified: Secondary | ICD-10-CM

## 2016-02-13 LAB — LIPID PANEL
Cholesterol: 242 mg/dL — ABNORMAL HIGH (ref 0–200)
HDL: 37.8 mg/dL — AB (ref >39.00)
Total CHOL/HDL Ratio: 6

## 2016-02-13 LAB — COMPREHENSIVE METABOLIC PANEL
ALK PHOS: 63 U/L (ref 39–117)
ALT: 20 U/L (ref 0–35)
AST: 20 U/L (ref 0–37)
Albumin: 3.9 g/dL (ref 3.5–5.2)
BILIRUBIN TOTAL: 0.3 mg/dL (ref 0.2–1.2)
BUN: 20 mg/dL (ref 6–23)
CALCIUM: 9.4 mg/dL (ref 8.4–10.5)
CHLORIDE: 107 meq/L (ref 96–112)
CO2: 27 meq/L (ref 19–32)
CREATININE: 0.58 mg/dL (ref 0.40–1.20)
GFR: 104.92 mL/min (ref >60.00)
Glucose, Bld: 149 mg/dL — ABNORMAL HIGH (ref 70–99)
POTASSIUM: 4.2 meq/L (ref 3.5–5.1)
Sodium: 140 mEq/L (ref 135–145)
Total Protein: 6.9 g/dL (ref 6.0–8.3)

## 2016-02-13 LAB — LDL CHOLESTEROL, DIRECT: LDL DIRECT: 158 mg/dL

## 2016-02-13 LAB — CBC
HEMATOCRIT: 40.7 % (ref 36.0–46.0)
HEMOGLOBIN: 13.7 g/dL (ref 12.0–15.0)
MCHC: 33.6 g/dL (ref 30.0–36.0)
MCV: 96.3 fl (ref 78.0–100.0)
PLATELETS: 219 10*3/uL (ref 150.0–400.0)
RBC: 4.23 Mil/uL (ref 3.87–5.11)
RDW: 13.1 % (ref 11.5–15.5)
WBC: 6 10*3/uL (ref 4.0–10.5)

## 2016-02-13 MED ORDER — ESTRADIOL 10 MCG VA TABS: ORAL_TABLET | VAGINAL | Status: DC

## 2016-02-13 NOTE — Progress Notes (Signed)
Pre visit review using our clinic review tool, if applicable. No additional management support is needed unless otherwise documented below in the visit note. 

## 2016-02-13 NOTE — Patient Instructions (Addendum)
It was great to see you today- I will be in touch with your labs Please ask the front desk to do a records release for [facility] for you today- all I need is your immunization record so we can be sure to have everything documented.   If you like, we can try some topical estrogen for your vaginal symptoms.  I will give you this rx to hold on to - let me know if you have any questions or wish to start using it  I will also refer you to Dr. Nelva Bush at [facility] orth[facility] to look at your spine and hip

## 2016-02-13 NOTE — Progress Notes (Signed)
[place] at [facility] [address] [place], [place] [address] [phone]  Date:  02/13/2016   Name:  Jaime Mooney   DOB:  1929/05/31   MRN:  [id number]  PCP:  Lamar Blinks, MD    Chief Complaint: Annual Exam and Vaginitis   History of Present Illness:  Jaime Mooney is a 82 y.o. very pleasant female patient who presents with the following:  Here today for a CPE.   Due for a prevnar today Most recent labs about 1.5 years ago  She did have a pneumovax 10 years ago- this caused some pain in her arm that went away, she did not have any other allergic reaction,  She would like to have prevnar 13 today She is not fasting today- we will do labs anyway  She feels that she has a vaginal yeast infection- she feels itchy and often uncomfortable, she can have pain when she had her husband try sexual activity  unfortunatly Paizly has fairly severe thoracolumbar scoliosis. This has been present since she was a child but is just now starting to really cause her problems. She has lost about 2 inches in height and notes that she cannot get her breath as well as she could (?due to physical lung compression), and her left hip hurts some of the time She is not on any rx medications at this time   Patient Active Problem List   Diagnosis Date Noted  . Abnormal EKG 11/15/2014  . PVC (premature ventricular contraction)   . Urinary incontinence 12/02/2013  . Left hip pain 12/02/2013  . Left shoulder pain 12/02/2013  . Scoliosis 12/02/2013  . Allergic rhinitis due to pollen 12/02/2013    Past Medical History  Diagnosis Date  . Allergy   . Arthritis   . GERD (gastroesophageal reflux disease)   . PVC (premature ventricular contraction)     Past Surgical History  Procedure Laterality Date  . Breast surgery    . Cholecystectomy    . Fracture surgery    . Right breast surgery 2012    . Left breast surgery 20 yrs ago    . Nose  jfracture 16 yeras ago    . Gallbladder surgery 5 years ago    . Eye surgery      cataracts bilateral     Social History  Substance Use Topics  . Smoking status: Former Research scientist (life sciences)  . Smokeless tobacco: None  . Alcohol Use: Yes     Comment: 7 drinks per week    Family History  Problem Relation Age of Onset  . Cancer Mother   . Cancer Father   . Diabetes Father   . Heart disease Father   . Stroke Father   . Cancer Sister   . Heart disease Maternal Grandmother   . Heart disease Maternal Grandfather   . Heart disease Paternal Grandmother   . Diabetes Paternal Grandfather     Allergies  Allergen Reactions  . Epinephrine     Causes heart palpatations  . Lisinopril     Facial swelling and tongue swelling  . Norvasc [Amlodipine Besylate] Swelling  . Prednisone     Facial swelling    Medication list has been reviewed and updated.  Current Outpatient Prescriptions on File Prior to Visit  Medication Sig Dispense Refill  . aspirin 81 MG tablet Take 81 mg by mouth daily.    . calcium elemental as carbonate (BARIATRIC TUMS ULTRA) 400  MG tablet Chew 1,000 mg by mouth 3 (three) times daily.    . Cholecalciferol (VITAMIN D3) 2000 UNITS TABS Take 2,000 Units by mouth daily.    . Magnesium 250 MG TABS Take 250 mg by mouth daily.    . Multiple Vitamins-Minerals (CENTRUM SILVER PO) Take by mouth.    . nystatin (MYCOSTATIN/NYSTOP) 100000 UNIT/GM POWD Apply powder up to 3 times per day as needed to affected area up to 2 weeks 60 g 0  . Omega-3 Fatty Acids (FISH OIL) 1000 MG CAPS Take 1,000 mg by mouth daily.    Vladimir Faster Glycol-Propyl Glycol (SYSTANE OP) Apply to eye.    . Red Yeast Rice Extract (RED YEAST RICE PO) Take by mouth.    . vitamin A 8000 UNIT capsule Take 8,000 Units by mouth daily.    . vitamin E 200 UNIT capsule Take 200 Units by mouth daily.    Marland Kitchen OVER THE COUNTER MEDICATION Reported on 02/13/2016     No current facility-administered medications on file prior to visit.     Review of Systems:  As per HPI- otherwise negative.   Physical Examination: Filed Vitals:   02/13/16 1035  BP: 124/82  Pulse: 80  Temp: 97.5 F (36.4 C)   Filed Vitals:   02/13/16 1035  Height: 5\' 2"  (1.575 m)  Weight: 150 lb 6.4 oz (68.221 kg)   Body mass index is 27.5 kg/(m^2). Ideal Body Weight: Weight in (lb) to have BMI = 25: 136.4  GEN: WDWN, NAD, Non-toxic, A & O x 3, older lady who is developing some kyphosis. Otherwise looks well HEENT: Atraumatic, Normocephalic. Neck supple. No masses, No LAD. Ears and Nose: No external deformity. CV: RRR, No M/G/R. No JVD. No thrill. No extra heart sounds. PULM: CTA B, no wheezes, crackles, rhonchi. No retractions. No resp. distress. No accessory muscle use. ABD: S, NT, ND, +BS. No rebound. No HSM. EXTR: No c/c/e NEURO Normal gait.  PSYCH: Normally interactive. Conversant. Not depressed or anxious appearing.  Calm demeanor.  Breast: normal exam, no masses/ dimpling/ discharge Pelvic: normal for age, some atrophy is apparent externally.  She had pain when I tried to place speculum so did not proceed. Collected wet prep    Assessment and Plan: Physical exam  Immunization due - Plan: Pneumococcal conjugate vaccine 13-valent IM  Vaginal irritation - Plan: Cervicovaginal ancillary only, Estradiol 10 MCG TABS vaginal tablet  Screening for hyperlipidemia - Plan: Lipid panel  Screening for diabetes mellitus - Plan: Comprehensive metabolic panel  Medication monitoring encounter - Plan: CBC  Hyperlipidemia - Plan: Lipid panel  Idiopathic scoliosis - Plan: Ambulatory referral to Physical Medicine Rehab  Here today for a CPE Labs, prevnar Did a wet prep but suspect that her vaginal sx are actually due to atrophy- gave her an rx for vaginal estrogen but she is not sure if she wants to start this Will plan further follow- up pending labs. She is having more symptoms associated with her long term scoliosis. Will refer to  [facility] to see if they have anything to offer her    Signed Lamar Blinks, MD

## 2016-02-14 LAB — CERVICOVAGINAL ANCILLARY ONLY: WET PREP (BD AFFIRM): NEGATIVE

## 2016-02-15 ENCOUNTER — Encounter: Payer: Self-pay | Admitting: Family Medicine

## 2016-02-22 DIAGNOSIS — M5136 Other intervertebral disc degeneration, lumbar region: Secondary | ICD-10-CM | POA: Diagnosis not present

## 2016-02-22 DIAGNOSIS — M4124 Other idiopathic scoliosis, thoracic region: Secondary | ICD-10-CM | POA: Diagnosis not present

## 2016-02-22 DIAGNOSIS — M50822 Other cervical disc disorders at C5-C6 level: Secondary | ICD-10-CM | POA: Diagnosis not present

## 2016-02-22 DIAGNOSIS — M542 Cervicalgia: Secondary | ICD-10-CM | POA: Diagnosis not present

## 2016-06-07 DIAGNOSIS — Z961 Presence of intraocular lens: Secondary | ICD-10-CM | POA: Diagnosis not present

## 2016-06-07 DIAGNOSIS — H52203 Unspecified astigmatism, bilateral: Secondary | ICD-10-CM | POA: Diagnosis not present

## 2016-06-07 DIAGNOSIS — H01001 Unspecified blepharitis right upper eyelid: Secondary | ICD-10-CM | POA: Diagnosis not present

## 2016-06-07 DIAGNOSIS — H04123 Dry eye syndrome of bilateral lacrimal glands: Secondary | ICD-10-CM | POA: Diagnosis not present

## 2016-06-14 ENCOUNTER — Ambulatory Visit (INDEPENDENT_AMBULATORY_CARE_PROVIDER_SITE_OTHER): Payer: Medicare Other | Admitting: Family Medicine

## 2016-06-14 VITALS — BP 126/57 | HR 70 | Temp 98.2°F | Ht 62.0 in | Wt 144.0 lb

## 2016-06-14 DIAGNOSIS — R140 Abdominal distension (gaseous): Secondary | ICD-10-CM

## 2016-06-14 DIAGNOSIS — R7309 Other abnormal glucose: Secondary | ICD-10-CM

## 2016-06-14 DIAGNOSIS — Z23 Encounter for immunization: Secondary | ICD-10-CM | POA: Diagnosis not present

## 2016-06-14 DIAGNOSIS — E782 Mixed hyperlipidemia: Secondary | ICD-10-CM

## 2016-06-14 DIAGNOSIS — R1900 Intra-abdominal and pelvic swelling, mass and lump, unspecified site: Secondary | ICD-10-CM

## 2016-06-14 NOTE — Patient Instructions (Signed)
It was very nice to see you today- please come in tomorrow for fasting labs. Stop at the desk and make a lab visit appt for tomorrow.   I will also order a CT scan for you for next week to evaluate the swelling in your belly Take care and I'll be in touch with your labs and also your CT scan when it comes in Go ahead and call [facility] to reschedule a PT appointment [phone]

## 2016-06-14 NOTE — Progress Notes (Addendum)
[place] at [facility] [address] [place], [place] [address] [phone]  Date:  06/14/2016   Name:  Jaime Mooney   DOB:  1929-03-05   MRN:  [id number]  PCP:  Jaime Blinks, MD    Chief Complaint: Follow-up (Pt here for f/u visit. )   History of Present Illness:  Jaime Mooney is a 82 y.o. very pleasant female patient who presents with the following:  Last seen by myself in May of this year for her CPE  Here today for a CPE.   Due for a prevnar today Most recent labs about 1.5 years ago  She did have a pneumovax 10 years ago- this caused some pain in her arm that went away, she did not have any other allergic reaction,  She would like to have prevnar 13 today She is not fasting today- we will do labs anyway  She feels that she has a vaginal yeast infection- she feels itchy and often uncomfortable, she can have pain when she had her husband try sexual activity  unfortunatly Jaime Mooney has fairly severe thoracolumbar scoliosis. This has been present since she was a child but is just now starting to really cause her problems. She has lost about 2 inches in height and notes that she cannot get her breath as well as she could (?due to physical lung compression), and her left hip hurts some of the time She is not on any rx medications at this time  We did labs at that time- I had asked her to come in for fasting labs to recheck her glucose and triglycerides at that visit.   She is not fasting today but plans to come in tomorrow for blood work Would like a flu shot today She has seen Dr. Nelva Mooney for her back- the plan was for her to start PT, but she had to delay due to her husband's recent illness. She would like to go back to PT and hopes there will not be any problem with starting back.  She will let me know if she needs a referral again. The plan is for her to do PT for one month and then see Dr. Nelva Mooney  She has lost a few pounds  intentionally- however she still seems to be too big for most of her clothes right now.  She notes that her belly seems very full and "tight as a drum all the time," she has noted this consistently for a month or so, and intermittently in the past She does not have constipation.  Also, she notes that having a BM does not resolve her sx She still has all her pelvic organs.    Last mammo was in the last few years but she is not exactly sure of the date.  She has also noted some breast tendenress     Wt Readings from Last 3 Encounters:  06/14/16 144 lb (65.3 kg)  02/13/16 150 lb 6.4 oz (68.2 kg)  06/28/15 149 lb (67.6 kg)    Patient Active Problem List   Diagnosis Date Noted  . Abnormal EKG 11/15/2014  . PVC (premature ventricular contraction)   . Urinary incontinence 12/02/2013  . Left hip pain 12/02/2013  . Left shoulder pain 12/02/2013  . Scoliosis 12/02/2013  . Allergic rhinitis due to pollen 12/02/2013    Past Medical History:  Diagnosis Date  . Allergy   . Arthritis   . GERD (gastroesophageal reflux disease)   .  PVC (premature ventricular contraction)     Past Surgical History:  Procedure Laterality Date  . BREAST SURGERY    . CHOLECYSTECTOMY    . EYE SURGERY     cataracts bilateral   . FRACTURE SURGERY    . gallbladder surgery 5 years ago    . left breast surgery 20 yrs ago    . nose jfracture 16 yeras ago    . right breast surgery 2012      Social History  Substance Use Topics  . Smoking status: Former Research scientist (life sciences)  . Smokeless tobacco: Not on file  . Alcohol use Yes     Comment: 7 drinks per week    Family History  Problem Relation Age of Onset  . Cancer Mother   . Cancer Father   . Diabetes Father   . Heart disease Father   . Stroke Father   . Cancer Sister   . Heart disease Maternal Grandmother   . Heart disease Maternal Grandfather   . Heart disease Paternal Grandmother   . Diabetes Paternal Grandfather     Allergies  Allergen Reactions  .  Epinephrine     Causes heart palpatations  . Lisinopril     Facial swelling and tongue swelling  . Norvasc [Amlodipine Besylate] Swelling  . Prednisone     Facial swelling    Medication list has been reviewed and updated.  Current Outpatient Prescriptions on File Prior to Visit  Medication Sig Dispense Refill  . aspirin 81 MG tablet Take 81 mg by mouth daily.    . calcium elemental as carbonate (BARIATRIC TUMS ULTRA) 400 MG tablet Chew 1,000 mg by mouth 3 (three) times daily.    . Cholecalciferol (VITAMIN D3) 2000 UNITS TABS Take 2,000 Units by mouth daily.    . Estradiol 10 MCG TABS vaginal tablet Use 1 tablet vaginally 2-3x a week as needed for vaginal dryness 20 tablet 2  . Magnesium 250 MG TABS Take 250 mg by mouth daily.    . Multiple Vitamins-Minerals (CENTRUM SILVER PO) Take by mouth.    . nystatin (MYCOSTATIN/NYSTOP) 100000 UNIT/GM POWD Apply powder up to 3 times per day as needed to affected area up to 2 weeks 60 g 0  . Omega-3 Fatty Acids (FISH OIL) 1000 MG CAPS Take 1,000 mg by mouth daily.    Marland Kitchen OVER THE COUNTER MEDICATION Reported on 02/13/2016    . Polyethyl Glycol-Propyl Glycol (SYSTANE OP) Apply to eye.    . Red Yeast Rice Extract (RED YEAST RICE PO) Take by mouth.    . vitamin A 8000 UNIT capsule Take 8,000 Units by mouth daily.    . vitamin E 200 UNIT capsule Take 200 Units by mouth daily.     No current facility-administered medications on file prior to visit.     Review of Systems:  As per HPI- otherwise negative.  Physical Examination: Blood pressure (!) 126/57, pulse 70, temperature 98.2 F (36.8 C), temperature source Oral, height 5\' 2"  (1.575 m), weight 144 lb (65.3 kg), SpO2 94 %.  BP Readings from Last 3 Encounters:  06/14/16 (!) 126/57  02/13/16 124/82  06/28/15 138/70     There were no vitals filed for this visit. Vitals:   06/14/16 1309  Weight: 144 lb (65.3 kg)  Height: 5\' 2"  (1.575 m)   Body mass index is 26.34 kg/m. Ideal Body Weight:  Weight in (lb) to have BMI = 25: 136.4  GEN: WDWN, NAD, Non-toxic, A & O x 3, looks  well She has severe thoracic kyphosis/ scoliosis HEENT: Atraumatic, Normocephalic. Neck supple. No masses, No LAD. Ears and Nose: No external deformity. CV: RRR, No M/G/R. No JVD. No thrill. No extra heart sounds. PULM: CTA B, no wheezes, crackles, rhonchi. No retractions. No resp. distress. No accessory muscle use. ABD: S, NT +BS. No rebound. No HSM. Her lower belly is larger than would be expected, slightly distended.  It is soft however EXTR: No c/c/e NEURO Normal gait.  PSYCH: Normally interactive. Conversant. Not depressed or anxious appearing.  Calm demeanor.  She had noted a "hard spot" in the inferior right breast.  However this is now resolved and not present on exam  Assessment and Plan: Elevated glucose - Plan: Comprehensive metabolic panel, Hemoglobin A1c  Elevated triglycerides with high cholesterol - Plan: Lipid panel  Abdominal bloating - Plan: CT Abdomen Pelvis W Contrast  Abdominal swelling, mass, or lump - Plan: CT Abdomen Pelvis W Contrast  Encounter for immunization - Plan: Flu vaccine HIGH DOSE PF  Here today with a few concerns Ordered labs which she will return for tomorrow Flu shot today She has noted abd swelling and bloating for about one month, and notes that she cannot fit into most of her clothes despite having lost over 5 lbs recently.  This may be due to her severe back problems as she has lost a lot of height.  However would like to make certain there is nothing more serious going on.   Will have her do a CT of her abd/ pelvis in the next week or so. Will plan further follow- up pending labs.   Signed Jaime Blinks, MD  Received her labs and gave her a call 9/18.  No answer.  Need to discuss A1c in diabetic range and possible lipid treatment so will call back   Results for orders placed or performed in visit on 06/14/16  Comprehensive metabolic panel  Result  Value Ref Range   Sodium 141 135 - 145 mEq/L   Potassium 5.0 3.5 - 5.1 mEq/L   Chloride 105 96 - 112 mEq/L   CO2 31 19 - 32 mEq/L   Glucose, Bld 132 (H) 70 - 99 mg/dL   BUN 19 6 - 23 mg/dL   Creatinine, Ser 0.66 0.40 - 1.20 mg/dL   Total Bilirubin 0.6 0.2 - 1.2 mg/dL   Alkaline Phosphatase 59 39 - 117 U/L   AST 15 0 - 37 U/L   ALT 17 0 - 35 U/L   Total Protein 7.0 6.0 - 8.3 g/dL   Albumin 3.8 3.5 - 5.2 g/dL   Calcium 9.8 8.4 - 10.5 mg/dL   GFR 90.32 >60.00 mL/min  Lipid panel  Result Value Ref Range   Cholesterol 218 (H) 0 - 200 mg/dL   Triglycerides 132.0 0.0 - 149.0 mg/dL   HDL 47.10 >39.00 mg/dL   VLDL 26.4 0.0 - 40.0 mg/dL   LDL Cholesterol 144 (H) 0 - 99 mg/dL   Total CHOL/HDL Ratio 5    NonHDL 170.83   Hemoglobin A1c  Result Value Ref Range   Hgb A1c MFr Bld 6.6 (H) 4.6 - 6.5 %

## 2016-06-15 ENCOUNTER — Other Ambulatory Visit (INDEPENDENT_AMBULATORY_CARE_PROVIDER_SITE_OTHER): Payer: Medicare Other

## 2016-06-15 DIAGNOSIS — R7309 Other abnormal glucose: Secondary | ICD-10-CM | POA: Diagnosis not present

## 2016-06-15 LAB — COMPREHENSIVE METABOLIC PANEL
ALBUMIN: 3.8 g/dL (ref 3.5–5.2)
ALK PHOS: 59 U/L (ref 39–117)
ALT: 17 U/L (ref 0–35)
AST: 15 U/L (ref 0–37)
BUN: 19 mg/dL (ref 6–23)
CO2: 31 mEq/L (ref 19–32)
CREATININE: 0.66 mg/dL (ref 0.40–1.20)
Calcium: 9.8 mg/dL (ref 8.4–10.5)
Chloride: 105 mEq/L (ref 96–112)
GFR: 90.32 mL/min (ref >60.00)
GLUCOSE: 132 mg/dL — AB (ref 70–99)
Potassium: 5 mEq/L (ref 3.5–5.1)
SODIUM: 141 meq/L (ref 135–145)
TOTAL PROTEIN: 7 g/dL (ref 6.0–8.3)
Total Bilirubin: 0.6 mg/dL (ref 0.2–1.2)

## 2016-06-15 LAB — LIPID PANEL
CHOLESTEROL: 218 mg/dL — AB (ref 0–200)
HDL: 47.1 mg/dL (ref >39.00)
LDL Cholesterol: 144 mg/dL — ABNORMAL HIGH (ref 0–99)
NONHDL: 170.83
Total CHOL/HDL Ratio: 5
Triglycerides: 132 mg/dL (ref 0.0–149.0)
VLDL: 26.4 mg/dL (ref 0.0–40.0)

## 2016-06-15 LAB — HEMOGLOBIN A1C: HEMOGLOBIN A1C: 6.6 % — AB (ref 4.6–6.5)

## 2016-06-18 ENCOUNTER — Telehealth: Payer: Self-pay | Admitting: Family Medicine

## 2016-06-18 NOTE — Telephone Encounter (Signed)
Pt called in returning call. She says that it could have been for lab results but she's not sure. She says no vm was left.

## 2016-06-19 ENCOUNTER — Ambulatory Visit (HOSPITAL_BASED_OUTPATIENT_CLINIC_OR_DEPARTMENT_OTHER)
Admission: RE | Admit: 2016-06-19 | Discharge: 2016-06-19 | Disposition: A | Discharge status: Home / Self Care | Payer: Medicare Other | Source: Ambulatory Visit | Attending: Family Medicine | Admitting: Family Medicine

## 2016-06-19 ENCOUNTER — Encounter (HOSPITAL_BASED_OUTPATIENT_CLINIC_OR_DEPARTMENT_OTHER): Payer: Self-pay

## 2016-06-19 ENCOUNTER — Encounter: Payer: Self-pay | Admitting: Family Medicine

## 2016-06-19 DIAGNOSIS — E119 Type 2 diabetes mellitus without complications: Secondary | ICD-10-CM

## 2016-06-19 DIAGNOSIS — R1900 Intra-abdominal and pelvic swelling, mass and lump, unspecified site: Secondary | ICD-10-CM | POA: Diagnosis not present

## 2016-06-19 DIAGNOSIS — Z9049 Acquired absence of other specified parts of digestive tract: Secondary | ICD-10-CM | POA: Diagnosis not present

## 2016-06-19 DIAGNOSIS — R109 Unspecified abdominal pain: Secondary | ICD-10-CM | POA: Diagnosis not present

## 2016-06-19 DIAGNOSIS — I700 Atherosclerosis of aorta: Secondary | ICD-10-CM | POA: Diagnosis not present

## 2016-06-19 DIAGNOSIS — R140 Abdominal distension (gaseous): Secondary | ICD-10-CM | POA: Diagnosis not present

## 2016-06-19 HISTORY — DX: Type 2 diabetes mellitus without complications: E11.9

## 2016-06-19 MED ORDER — IOPAMIDOL (ISOVUE-300) INJECTION 61%
100.0000 mL | Freq: Once | INTRAVENOUS | Status: AC | PRN
  Administered 2016-06-19: 100 mL via INTRAVENOUS

## 2016-09-20 ENCOUNTER — Ambulatory Visit (INDEPENDENT_AMBULATORY_CARE_PROVIDER_SITE_OTHER): Payer: Medicare Other | Admitting: Family Medicine

## 2016-09-20 ENCOUNTER — Encounter: Payer: Self-pay | Admitting: Family Medicine

## 2016-09-20 VITALS — BP 139/62 | HR 80 | Temp 97.7°F | Ht 62.0 in | Wt 142.0 lb

## 2016-09-20 DIAGNOSIS — J3089 Other allergic rhinitis: Secondary | ICD-10-CM | POA: Diagnosis not present

## 2016-09-20 DIAGNOSIS — R7303 Prediabetes: Secondary | ICD-10-CM

## 2016-09-20 DIAGNOSIS — K224 Dyskinesia of esophagus: Secondary | ICD-10-CM | POA: Diagnosis not present

## 2016-09-20 DIAGNOSIS — B372 Candidiasis of skin and nail: Secondary | ICD-10-CM

## 2016-09-20 MED ORDER — FLUCONAZOLE 150 MG PO TABS: 150.0000 mg | ORAL_TABLET | Freq: Once | ORAL | 0 refills | Status: AC

## 2016-09-20 NOTE — Progress Notes (Signed)
[place] at [facility] [address] [place], [place] [address] [phone]  Date:  09/20/2016   Name:  Jaime Mooney   DOB:  [birth year]   MRN:  [id number]  PCP:  Lamar Blinks, MD    Chief Complaint: Dysphagia (c/o occ difficulty swallowing food when eating a meal x 1 week. Pt does have hx of GERD. )   History of Present Illness:  Jaime Mooney is a 82 y.o. very pleasant female patient who presents with the following:  Last seen by myself about 3 months ago  Today she notes intermitten difficulty swallowing foods and liquids for about 3 weeks.  She does not have pain, but does feel a discomfort like something is getting stuck in her chest. If she tries to wash down anything that is stuck with water it will "spew back up."   She has not had this in the past- she has never had an esophageal dilatation.   She does have a history of GERD She had an upper GI at some point in the past- however this was not done in this area and she does not remember quite why she had the scope.  She does not have a GI doctor here in town.    She has also notes sneezing and her ears hurt, runny nose.  She thinks that she has allergies   Lab Results  Component Value Date   HGBA1C 6.6 (H) 06/15/2016   At her last visit I had noted elevated A1c and lipids.  I had tried to contact her about this- she did read my mychart message.  At this time she is not interested in metformin or a cholesterol med See message below- Here are your recent labs- there are a couple of details to go over  Your A1c shows that you do have diabetes; you are barely over the 6.5% cut off. I would recommend that we consider a low dose of metformin to help control your blood sugar, and also a cholesterol medication to lower your LDL. Your LDL goal is a bit stricter when you do have diabetes. !!! Let me know if these medications would be ok with you and I will rx them for you!!!  Otherwise try to eat fewer carbs and sweets, more vegetables and protein. Let's plan to recheck in about 4 months.    She is using zyrtec for her allergies that does help- not using any nasal spray at this time but does not feel like she needs it  She continues to have candida intertrigo- she has used lamisil cream which helps, but it is not yet gone.  She is still bothered by an irritating rash in her groin and under her breasts .  Would like to try diflucan as I suggested Wt Readings from Last 3 Encounters:  09/20/16 142 lb (64.4 kg)  06/14/16 144 lb (65.3 kg)  02/13/16 150 lb 6.4 oz (68.2 kg)   BP Readings from Last 3 Encounters:  09/20/16 139/62  06/14/16 (!) 126/57  02/13/16 124/82    She has lost a few lbs- she is trying to lose weight  Patient Active Problem List   Diagnosis Date Noted  . Diabetes mellitus type 2, uncomplicated ([employer]) [id number]  . Abnormal EKG 11/15/2014  . PVC (premature ventricular contraction)   . Urinary incontinence 12/02/2013  . Left hip pain 12/02/2013  . Left shoulder pain 12/02/2013  . Scoliosis 12/02/2013  . Allergic  rhinitis due to pollen 12/02/2013    Past Medical History:  Diagnosis Date  . Allergy   . Arthritis   . Diabetes mellitus type 2, uncomplicated ([place]) A999333  . GERD (gastroesophageal reflux disease)   . PVC (premature ventricular contraction)     Past Surgical History:  Procedure Laterality Date  . BREAST SURGERY    . CHOLECYSTECTOMY    . EYE SURGERY     cataracts bilateral   . FRACTURE SURGERY    . gallbladder surgery 5 years ago    . left breast surgery 20 yrs ago    . nose jfracture 16 yeras ago    . right breast surgery 2012      Social History  Substance Use Topics  . Smoking status: Former Research scientist (life sciences)  . Smokeless tobacco: Not on file  . Alcohol use Yes     Comment: 7 drinks per week    Family History  Problem Relation Age of Onset  . Cancer Mother   . Cancer Father   . Diabetes Father   . Heart  disease Father   . Stroke Father   . Cancer Sister   . Heart disease Maternal Grandmother   . Heart disease Maternal Grandfather   . Heart disease Paternal Grandmother   . Diabetes Paternal Grandfather     Allergies  Allergen Reactions  . Epinephrine     Causes heart palpatations  . Lisinopril     Facial swelling and tongue swelling  . Norvasc [Amlodipine Besylate] Swelling  . Prednisone     Facial swelling    Medication list has been reviewed and updated.  Current Outpatient Prescriptions on File Prior to Visit  Medication Sig Dispense Refill  . aspirin 81 MG tablet Take 81 mg by mouth daily.    . calcium elemental as carbonate (BARIATRIC TUMS ULTRA) 400 MG tablet Chew 1,000 mg by mouth 3 (three) times daily.    . Cholecalciferol (VITAMIN D3) 2000 UNITS TABS Take 2,000 Units by mouth daily.    . Estradiol 10 MCG TABS vaginal tablet Use 1 tablet vaginally 2-3x a week as needed for vaginal dryness 20 tablet 2  . Magnesium 250 MG TABS Take 250 mg by mouth daily.    . Multiple Vitamins-Minerals (CENTRUM SILVER PO) Take by mouth.    . nystatin (MYCOSTATIN/NYSTOP) 100000 UNIT/GM POWD Apply powder up to 3 times per day as needed to affected area up to 2 weeks 60 g 0  . Omega-3 Fatty Acids (FISH OIL) 1000 MG CAPS Take 1,000 mg by mouth daily.    Marland Kitchen OVER THE COUNTER MEDICATION Reported on 02/13/2016    . Polyethyl Glycol-Propyl Glycol (SYSTANE OP) Apply to eye.    . Red Yeast Rice Extract (RED YEAST RICE PO) Take by mouth.    . vitamin A 8000 UNIT capsule Take 8,000 Units by mouth daily.    . vitamin E 200 UNIT capsule Take 200 Units by mouth daily.     No current facility-administered medications on file prior to visit.     Review of Systems:  As per HPI- otherwise negative. No diarrhea.  No symptoms of low blood sugar No fever, chills, nausea, vomiting or CP   Physical Examination: Vitals:   09/20/16 1311  BP: 139/62  Pulse: 80  Temp: 97.7 F (36.5 C)   Vitals:    09/20/16 1311  Weight: 142 lb (64.4 kg)  Height: 5\' 2"  (1.575 m)   Body mass index is 25.97 kg/m. Ideal Body Weight:  Weight in (lb) to have BMI = 25: 136.4  GEN: WDWN, NAD, Non-toxic, A & O x 3, looks well HEENT: Atraumatic, Normocephalic. Neck supple. No masses, No LAD.  Bilateral TM wnl, oropharynx normal.  PEERL,EOMI.   Ears and Nose: No external deformity. CV: RRR, No M/G/R. No JVD. No thrill. No extra heart sounds. PULM: CTA B, no wheezes, crackles, rhonchi. No retractions. No resp. distress. No accessory muscle use. Severe scoliosis of the TL spine ABD: S, NT, ND EXTR: No c/c/e NEURO Normal gait.  PSYCH: Normally interactive. Conversant. Not depressed or anxious appearing.  Calm demeanor.  Mild candida intertrigo persists under her bilateral breasts   Assessment and Plan: Esophageal spasm - Plan: Ambulatory referral to Gastroenterology, CANCELED: Ambulatory referral to Gastroenterology  Pre-diabetes  Chronic non-seasonal allergic rhinitis, unspecified trigger  Candidal intertrigo - Plan: fluconazole (DIFLUCAN) 150 MG tablet  Suspect esophageal spasm.  Referral to GI to evaluate Will try diflucan weekly to clear up rash See patient instructions for more details.     Signed Lamar Blinks, MD

## 2016-09-20 NOTE — Patient Instructions (Addendum)
I am going to have gastroenterology take a look at you- I think that you may be having esophageal muscle spasms.   If you develop any worsening in the meantime please let me know.  If you are having a spasm wait for it to pass before you try to eat or drink further.   We are going to use Diflucan by mouth once a week until the rash under your breasts clears up.   Continue your zyrtec pill for your allergies  Let's see you back to recheck your blood sugar and cholesterol in 3-4 months  Take care and happy holidays!

## 2016-09-20 NOTE — Progress Notes (Signed)
Pre visit review using our clinic review tool, if applicable. No additional management support is needed unless otherwise documented below in the visit note. 

## 2016-10-05 ENCOUNTER — Encounter: Payer: Self-pay | Admitting: Internal Medicine

## 2016-10-10 ENCOUNTER — Ambulatory Visit: Payer: Medicare Other | Admitting: Family Medicine

## 2016-10-31 ENCOUNTER — Emergency Department (HOSPITAL_BASED_OUTPATIENT_CLINIC_OR_DEPARTMENT_OTHER): Payer: Medicare Other

## 2016-10-31 ENCOUNTER — Encounter (HOSPITAL_BASED_OUTPATIENT_CLINIC_OR_DEPARTMENT_OTHER): Payer: Self-pay | Admitting: Emergency Medicine

## 2016-10-31 ENCOUNTER — Inpatient Hospital Stay (HOSPITAL_BASED_OUTPATIENT_CLINIC_OR_DEPARTMENT_OTHER)
Admission: EM | Admit: 2016-10-31 | Discharge: 2016-11-03 | DRG: 282 | Disposition: A | Discharge status: Home / Self Care | Payer: Medicare Other | Attending: Cardiology | Admitting: Cardiology

## 2016-10-31 DIAGNOSIS — I208 Other forms of angina pectoris: Secondary | ICD-10-CM

## 2016-10-31 DIAGNOSIS — I200 Unstable angina: Secondary | ICD-10-CM | POA: Diagnosis present

## 2016-10-31 DIAGNOSIS — Z87891 Personal history of nicotine dependence: Secondary | ICD-10-CM

## 2016-10-31 DIAGNOSIS — I214 Non-ST elevation (NSTEMI) myocardial infarction: Principal | ICD-10-CM | POA: Diagnosis present

## 2016-10-31 DIAGNOSIS — R079 Chest pain, unspecified: Secondary | ICD-10-CM

## 2016-10-31 DIAGNOSIS — E119 Type 2 diabetes mellitus without complications: Secondary | ICD-10-CM | POA: Diagnosis not present

## 2016-10-31 DIAGNOSIS — Z8249 Family history of ischemic heart disease and other diseases of the circulatory system: Secondary | ICD-10-CM

## 2016-10-31 DIAGNOSIS — K219 Gastro-esophageal reflux disease without esophagitis: Secondary | ICD-10-CM | POA: Diagnosis not present

## 2016-10-31 DIAGNOSIS — R0600 Dyspnea, unspecified: Secondary | ICD-10-CM

## 2016-10-31 DIAGNOSIS — I10 Essential (primary) hypertension: Secondary | ICD-10-CM | POA: Diagnosis present

## 2016-10-31 DIAGNOSIS — Z833 Family history of diabetes mellitus: Secondary | ICD-10-CM

## 2016-10-31 DIAGNOSIS — Y92002 Bathroom of unspecified non-institutional (private) residence single-family (private) house as the place of occurrence of the external cause: Secondary | ICD-10-CM

## 2016-10-31 DIAGNOSIS — Z9114 Patient's other noncompliance with medication regimen: Secondary | ICD-10-CM

## 2016-10-31 DIAGNOSIS — R7989 Other specified abnormal findings of blood chemistry: Secondary | ICD-10-CM

## 2016-10-31 DIAGNOSIS — Z888 Allergy status to other drugs, medicaments and biological substances status: Secondary | ICD-10-CM

## 2016-10-31 DIAGNOSIS — F0390 Unspecified dementia without behavioral disturbance: Secondary | ICD-10-CM | POA: Diagnosis present

## 2016-10-31 DIAGNOSIS — R778 Other specified abnormalities of plasma proteins: Secondary | ICD-10-CM

## 2016-10-31 DIAGNOSIS — W19XXXA Unspecified fall, initial encounter: Secondary | ICD-10-CM | POA: Diagnosis present

## 2016-10-31 LAB — COMPREHENSIVE METABOLIC PANEL
ALT: 17 U/L (ref 14–54)
ANION GAP: 7 (ref 5–15)
AST: 18 U/L (ref 15–41)
Albumin: 3.8 g/dL (ref 3.5–5.0)
Alkaline Phosphatase: 63 U/L (ref 38–126)
BILIRUBIN TOTAL: 0.3 mg/dL (ref 0.3–1.2)
BUN: 23 mg/dL — ABNORMAL HIGH (ref 6–20)
CHLORIDE: 104 mmol/L (ref 101–111)
CO2: 30 mmol/L (ref 22–32)
Calcium: 9.3 mg/dL (ref 8.9–10.3)
Creatinine, Ser: 0.6 mg/dL (ref 0.44–1.00)
Glucose, Bld: 153 mg/dL — ABNORMAL HIGH (ref 65–99)
POTASSIUM: 3.9 mmol/L (ref 3.5–5.1)
Sodium: 141 mmol/L (ref 135–145)
TOTAL PROTEIN: 7.5 g/dL (ref 6.5–8.1)

## 2016-10-31 LAB — CBC WITH DIFFERENTIAL/PLATELET
BASOS ABS: 0 10*3/uL (ref 0.0–0.1)
Basophils Relative: 0 %
Eosinophils Absolute: 0.1 10*3/uL (ref 0.0–0.7)
Eosinophils Relative: 2 %
HEMATOCRIT: 43.2 % (ref 36.0–46.0)
HEMOGLOBIN: 14 g/dL (ref 12.0–15.0)
LYMPHS ABS: 1.6 10*3/uL (ref 0.7–4.0)
LYMPHS PCT: 29 %
MCH: 32.6 pg (ref 26.0–34.0)
MCHC: 32.4 g/dL (ref 30.0–36.0)
MCV: 100.5 fL — AB (ref 78.0–100.0)
Monocytes Absolute: 0.5 10*3/uL (ref 0.1–1.0)
Monocytes Relative: 9 %
NEUTROS ABS: 3.4 10*3/uL (ref 1.7–7.7)
NEUTROS PCT: 60 %
Platelets: 200 10*3/uL (ref 150–400)
RBC: 4.3 MIL/uL (ref 3.87–5.11)
RDW: 12 % (ref 11.5–15.5)
WBC: 5.7 10*3/uL (ref 4.0–10.5)

## 2016-10-31 LAB — TROPONIN I: Troponin I: 0.05 ng/mL (ref <0.03)

## 2016-10-31 NOTE — ED Notes (Signed)
Patient transported to X-ray 

## 2016-10-31 NOTE — ED Provider Notes (Signed)
[facility] Provider Note   CSN: [id number] Arrival date & time: 10/31/16  1930  By signing my name below, I, Jaquelyn Bitter., attest that this documentation has been prepared under the direction and in the presence of Davonna Belling, MD. Electronically signed: Jaquelyn Bitter., ED Scribe. 10/31/16. 11:40 PM.   History   Chief Complaint Chief Complaint  Patient presents with  . Chest Pain    HPI  Jaime Mooney is a 83 y.o. female with hx of GERD who presents to the Emergency Department complaining of mild to moderate chest pain with sudden onset x1 week. Pt states that she has had GERD attacks that produce chest pain similar to a heart attack. Pt states that she has had 2 episodes of severe chest pain that she suspects is GERD related. x2 days ago pt started experiencing severe chest pain while leaving a party with her husband. She reportedly took 3 Tums with relief of the symptoms. Per husband, today pt became stressed when he fell into the bath tub and as a result started experiencing a milder form of the chest pain. She reports the pain radiating through her back, neck and arms during these episodes. She states that the pain is worse than her past GERD episodes. Pt reports cough and mild SOB. She reports  the pain being exacerbated with exertion and ambulation. She denies hx of MI. Of note, pt has hx of chronic back pain.    The history is provided by the patient. No language interpreter was used.    Past Medical History:  Diagnosis Date  . Allergy   . Arthritis   . Diabetes mellitus type 2, uncomplicated ([place]) [id number]  . GERD (gastroesophageal reflux disease)   . PVC (premature ventricular contraction)     Patient Active Problem List   Diagnosis Date Noted  . Diabetes mellitus type 2, uncomplicated ([place]) [id number]  . Abnormal EKG 11/15/2014  . PVC (premature ventricular contraction)   . Urinary incontinence 12/02/2013  . Left hip pain  12/02/2013  . Left shoulder pain 12/02/2013  . Scoliosis 12/02/2013  . Allergic rhinitis due to pollen 12/02/2013    Past Surgical History:  Procedure Laterality Date  . BREAST SURGERY    . CHOLECYSTECTOMY    . EYE SURGERY     cataracts bilateral   . FRACTURE SURGERY    . gallbladder surgery 5 years ago    . left breast surgery 20 yrs ago    . nose jfracture 16 yeras ago    . right breast surgery 2012      OB History    No data available       Home Medications    Prior to Admission medications   Not on File    Family History Family History  Problem Relation Age of Onset  . Cancer Mother   . Cancer Father   . Diabetes Father   . Heart disease Father   . Stroke Father   . Cancer Sister   . Heart disease Maternal Grandmother   . Heart disease Maternal Grandfather   . Heart disease Paternal Grandmother   . Diabetes Paternal Grandfather     Social History Social History  Substance Use Topics  . Smoking status: Former Research scientist (life sciences)  . Smokeless tobacco: Not on file  . Alcohol use Yes     Comment: 7 drinks per week     Allergies   Epinephrine; Lisinopril; Norvasc [amlodipine besylate]; and Prednisone  Review of Systems Review of Systems  Constitutional: Negative for fever.  Respiratory: Positive for cough and shortness of breath.   Cardiovascular: Positive for chest pain.     Physical Exam Updated Vital Signs BP 124/100 (BP Location: Right Arm)   Pulse 80   Temp 98.2 F (36.8 C) (Oral)   Resp 20   Ht 5' (1.524 m)   Wt 139 lb (63 kg)   SpO2 98%   BMI 27.15 kg/m   Physical Exam  Constitutional: She appears well-developed and well-nourished. No distress.  HENT:  Head: Normocephalic and atraumatic.  Eyes: Conjunctivae are normal.  Neck: Neck supple.  Cardiovascular: Normal rate and regular rhythm.   No murmur heard. Pulmonary/Chest: Effort normal and breath sounds normal. No respiratory distress.  Lungs clear.  Abdominal: Soft. There is  tenderness (mild ) in the epigastric area.  Musculoskeletal: She exhibits no edema.  No peripheral edema  Neurological: She is alert.  Skin: Skin is warm and dry.  Psychiatric: She has a normal mood and affect.  Nursing note and vitals reviewed.    ED Treatments / Results   DIAGNOSTIC STUDIES: Oxygen Saturation is 98% on RA, normal by my interpretation.   COORDINATION OF CARE: 11:40 PM-Discussed next steps with pt. Pt verbalized understanding and is agreeable with the plan.    Labs (all labs ordered are listed, but only abnormal results are displayed) Labs Reviewed  COMPREHENSIVE METABOLIC PANEL - Abnormal; Notable for the following:       Result Value   Glucose, Bld 153 (*)    BUN 23 (*)    All other components within normal limits  CBC WITH DIFFERENTIAL/PLATELET - Abnormal; Notable for the following:    MCV 100.5 (*)    All other components within normal limits  TROPONIN I - Abnormal; Notable for the following:    Troponin I 0.05 (*)    All other components within normal limits  TROPONIN I    EKG  EKG Interpretation  Date/Time:  Wednesday October 31 2016 19:40:27 EST Ventricular Rate:  92 PR Interval:  192 QRS Duration: 86 QT Interval:  366 QTC Calculation: 452 R Axis:   15 Text Interpretation:  Sinus rhythm with occasional Premature ventricular complexes ST & T wave abnormality, consider lateral ischemia Abnormal ECG Confirmed by Alvino Chapel  MD, Ovid Curd [phone]) on 10/31/2016 8:24:22 PM       Radiology Dg Chest 2 View  Result Date: 10/31/2016 CLINICAL DATA:  Epigastric and chest pain for 1 day. EXAM: CHEST  2 VIEW COMPARISON:  12/02/2013 FINDINGS: Mild linear scarring in the lateral left lung, unchanged. Right lung is clear. Pulmonary vasculature is normal. No pleural effusion. Unchanged mild cardiomegaly. Hilar and mediastinal contours are unremarkable and unchanged. IMPRESSION: Stable cardiomegaly. Stable linear scarring in the lateral left lung. No acute  findings. Electronically Signed   By: Andreas Newport M.D.   On: 10/31/2016 21:04    Procedures Procedures (including critical care time)  Medications Ordered in ED Medications - No data to display   Initial Impression / Assessment and Plan / ED Course  I have reviewed the triage vital signs and the nursing notes.  Pertinent labs & imaging results that were available during my care of the patient were reviewed by me and considered in my medical decision making (see chart for details).     Patient with chest pain. EKG nonspecific changes. Stable over 2 in the ER. Initial troponin mildly elevated. Pain-free here. Previously seen Dr. Radford Pax and had  a negative stress test under 2 years ago and reportedly negative cath 6 years ago. Discussed with Dr. Loleta Books who requests I talked to cardiology. Discussed with Dr. Koleen Nimrod. They both request a delta troponin and if stable will come and medicine and if elevated will to cardiology. Care turned over to Dr. Florina Ou  Final Clinical Impressions(s) / ED Diagnoses   Final diagnoses:  Chest pain, unspecified type    New Prescriptions New Prescriptions   No medications on file   I personally performed the services described in this documentation, which was scribed in my presence. The recorded information has been reviewed and is accurate.        Davonna Belling, MD 10/31/16 [phone]

## 2016-10-31 NOTE — ED Triage Notes (Signed)
Chest pain

## 2016-11-01 ENCOUNTER — Inpatient Hospital Stay (HOSPITAL_COMMUNITY): Payer: Medicare Other

## 2016-11-01 ENCOUNTER — Encounter (HOSPITAL_COMMUNITY): Payer: Self-pay | Admitting: *Deleted

## 2016-11-01 DIAGNOSIS — Z888 Allergy status to other drugs, medicaments and biological substances status: Secondary | ICD-10-CM | POA: Diagnosis not present

## 2016-11-01 DIAGNOSIS — Z8249 Family history of ischemic heart disease and other diseases of the circulatory system: Secondary | ICD-10-CM | POA: Diagnosis not present

## 2016-11-01 DIAGNOSIS — I208 Other forms of angina pectoris: Secondary | ICD-10-CM | POA: Diagnosis not present

## 2016-11-01 DIAGNOSIS — R0602 Shortness of breath: Secondary | ICD-10-CM | POA: Diagnosis not present

## 2016-11-01 DIAGNOSIS — Z87891 Personal history of nicotine dependence: Secondary | ICD-10-CM | POA: Diagnosis not present

## 2016-11-01 DIAGNOSIS — I200 Unstable angina: Secondary | ICD-10-CM | POA: Diagnosis present

## 2016-11-01 DIAGNOSIS — Z833 Family history of diabetes mellitus: Secondary | ICD-10-CM | POA: Diagnosis not present

## 2016-11-01 DIAGNOSIS — I213 ST elevation (STEMI) myocardial infarction of unspecified site: Secondary | ICD-10-CM | POA: Diagnosis not present

## 2016-11-01 DIAGNOSIS — I10 Essential (primary) hypertension: Secondary | ICD-10-CM | POA: Diagnosis present

## 2016-11-01 DIAGNOSIS — R079 Chest pain, unspecified: Secondary | ICD-10-CM

## 2016-11-01 DIAGNOSIS — R748 Abnormal levels of other serum enzymes: Secondary | ICD-10-CM | POA: Diagnosis not present

## 2016-11-01 DIAGNOSIS — I214 Non-ST elevation (NSTEMI) myocardial infarction: Principal | ICD-10-CM

## 2016-11-01 DIAGNOSIS — E119 Type 2 diabetes mellitus without complications: Secondary | ICD-10-CM | POA: Diagnosis not present

## 2016-11-01 DIAGNOSIS — K219 Gastro-esophageal reflux disease without esophagitis: Secondary | ICD-10-CM | POA: Diagnosis present

## 2016-11-01 DIAGNOSIS — R0600 Dyspnea, unspecified: Secondary | ICD-10-CM

## 2016-11-01 DIAGNOSIS — Z794 Long term (current) use of insulin: Secondary | ICD-10-CM

## 2016-11-01 DIAGNOSIS — W19XXXA Unspecified fall, initial encounter: Secondary | ICD-10-CM | POA: Diagnosis present

## 2016-11-01 DIAGNOSIS — I2510 Atherosclerotic heart disease of native coronary artery without angina pectoris: Secondary | ICD-10-CM | POA: Diagnosis not present

## 2016-11-01 DIAGNOSIS — F0390 Unspecified dementia without behavioral disturbance: Secondary | ICD-10-CM | POA: Diagnosis present

## 2016-11-01 DIAGNOSIS — Z9114 Patient's other noncompliance with medication regimen: Secondary | ICD-10-CM | POA: Diagnosis not present

## 2016-11-01 DIAGNOSIS — Y92002 Bathroom of unspecified non-institutional (private) residence single-family (private) house as the place of occurrence of the external cause: Secondary | ICD-10-CM | POA: Diagnosis not present

## 2016-11-01 LAB — ECHOCARDIOGRAM COMPLETE
HEIGHTINCHES: 60 in
Weight: 2232 oz

## 2016-11-01 LAB — GLUCOSE, CAPILLARY
GLUCOSE-CAPILLARY: 134 mg/dL — AB (ref 65–99)
GLUCOSE-CAPILLARY: 139 mg/dL — AB (ref 65–99)
Glucose-Capillary: 128 mg/dL — ABNORMAL HIGH (ref 65–99)
Glucose-Capillary: 191 mg/dL — ABNORMAL HIGH (ref 65–99)

## 2016-11-01 LAB — CBC
HCT: 42.9 % (ref 36.0–46.0)
HEMOGLOBIN: 14 g/dL (ref 12.0–15.0)
MCH: 31.7 pg (ref 26.0–34.0)
MCHC: 32.6 g/dL (ref 30.0–36.0)
MCV: 97.3 fL (ref 78.0–100.0)
Platelets: 194 10*3/uL (ref 150–400)
RBC: 4.41 MIL/uL (ref 3.87–5.11)
RDW: 12.1 % (ref 11.5–15.5)
WBC: 5 10*3/uL (ref 4.0–10.5)

## 2016-11-01 LAB — HEPARIN LEVEL (UNFRACTIONATED)
Heparin Unfractionated: 0.62 IU/mL (ref 0.30–0.70)
Heparin Unfractionated: 0.55 IU/mL (ref 0.30–0.70)

## 2016-11-01 LAB — D-DIMER, QUANTITATIVE: D-Dimer, Quant: 1.17 ug/mL-FEU — ABNORMAL HIGH (ref 0.00–0.50)

## 2016-11-01 LAB — TROPONIN I: Troponin I: 0.14 ng/mL (ref <0.03)

## 2016-11-01 LAB — LIPID PANEL
CHOLESTEROL: 241 mg/dL — AB (ref 0–200)
HDL: 50 mg/dL (ref >40)
LDL Cholesterol: 161 mg/dL — ABNORMAL HIGH (ref 0–99)
TRIGLYCERIDES: 151 mg/dL — AB (ref <150)
Total CHOL/HDL Ratio: 4.8 RATIO
VLDL: 30 mg/dL (ref 0–40)

## 2016-11-01 MED ORDER — INSULIN ASPART 100 UNIT/ML ~~LOC~~ SOLN: 0.0000 [IU] | Freq: Every day | SUBCUTANEOUS | Status: DC

## 2016-11-01 MED ORDER — ASPIRIN 81 MG PO CHEW
324.0000 mg | CHEWABLE_TABLET | Freq: Once | ORAL | Status: AC
  Administered 2016-11-01: 324 mg via ORAL
  Filled 2016-11-01: qty 4

## 2016-11-01 MED ORDER — METOPROLOL TARTRATE 12.5 MG HALF TABLET
12.5000 mg | ORAL_TABLET | Freq: Two times a day (BID) | ORAL | Status: DC
  Administered 2016-11-02 – 2016-11-03 (×2): 12.5 mg via ORAL
  Filled 2016-11-01 (×2): qty 1

## 2016-11-01 MED ORDER — NITROGLYCERIN 0.4 MG SL SUBL
0.4000 mg | SUBLINGUAL_TABLET | SUBLINGUAL | Status: DC | PRN
  Administered 2016-11-02 (×2): 0.4 mg via SUBLINGUAL
  Filled 2016-11-01: qty 1

## 2016-11-01 MED ORDER — ASPIRIN EC 81 MG PO TBEC
81.0000 mg | DELAYED_RELEASE_TABLET | Freq: Every day | ORAL | Status: DC
  Administered 2016-11-02 – 2016-11-03 (×2): 81 mg via ORAL
  Filled 2016-11-01 (×2): qty 1

## 2016-11-01 MED ORDER — INSULIN ASPART 100 UNIT/ML ~~LOC~~ SOLN: 0.0000 [IU] | Freq: Three times a day (TID) | SUBCUTANEOUS | Status: DC

## 2016-11-01 MED ORDER — HEPARIN BOLUS VIA INFUSION
3000.0000 [IU] | Freq: Once | INTRAVENOUS | Status: AC
  Administered 2016-11-01: 3000 [IU] via INTRAVENOUS

## 2016-11-01 MED ORDER — ACETAMINOPHEN 325 MG PO TABS
650.0000 mg | ORAL_TABLET | ORAL | Status: DC | PRN
  Filled 2016-11-01: qty 2

## 2016-11-01 MED ORDER — ATORVASTATIN CALCIUM 40 MG PO TABS: 40.0000 mg | ORAL_TABLET | Freq: Every day | ORAL | Status: DC

## 2016-11-01 MED ORDER — ONDANSETRON HCL 4 MG/2ML IJ SOLN
4.0000 mg | Freq: Four times a day (QID) | INTRAMUSCULAR | Status: DC | PRN
  Administered 2016-11-02: 4 mg via INTRAVENOUS
  Filled 2016-11-01: qty 2

## 2016-11-01 MED ORDER — HEPARIN (PORCINE) IN NACL 100-0.45 UNIT/ML-% IJ SOLN
800.0000 [IU]/h | INTRAMUSCULAR | Status: DC
  Administered 2016-11-01 – 2016-11-02 (×2): 800 [IU]/h via INTRAVENOUS
  Filled 2016-11-01 (×2): qty 250

## 2016-11-01 NOTE — Progress Notes (Signed)
Progress Note  Patient Name: Jaime Mooney Date of Encounter: 11/01/2016  Primary Cardiologist: Dr. Radford Pax  Subjective   No episodes episodes of chest pain  Inpatient Medications    Scheduled Meds: . [START ON 11/02/2016] aspirin EC  81 mg Oral Daily  . atorvastatin  40 mg Oral q1800  . insulin aspart  0-5 Units Subcutaneous QHS  . insulin aspart  0-9 Units Subcutaneous TID WC  . metoprolol tartrate  12.5 mg Oral BID   Continuous Infusions: . heparin 800 Units/hr (11/01/16 0041)   PRN Meds: acetaminophen, nitroGLYCERIN, ondansetron (ZOFRAN) IV   Vital Signs    Vitals:   11/01/16 0100 11/01/16 0141 11/01/16 0255 11/01/16 0500  BP: 142/75 139/75 (!) 182/74 (!) 155/55  Pulse: 75 77 78   Resp: 14 16 18    Temp:   97.6 F (36.4 C)   TempSrc:   Oral   SpO2: 99% 99% 99%   Weight:   139 lb 8 oz (63.3 kg)   Height:   5' (1.524 m)     Intake/Output Summary (Last 24 hours) at 11/01/16 0944 Last data filed at 11/01/16 0300  Gross per 24 hour  Intake            18.53 ml  Output                0 ml  Net            18.53 ml   Filed Weights   10/31/16 1946 11/01/16 0255  Weight: 139 lb (63 kg) 139 lb 8 oz (63.3 kg)    Telemetry    SR - Personally Reviewed  ECG    SR with PVCs, TWI in leads I, aVL, v1, aVR- Personally Reviewed  Physical Exam   GEN: No acute distress.   Neck: No JVD Cardiac: RRR, no murmurs, rubs, or gallops.  Respiratory: Clear to auscultation bilaterally. GI: Soft, nontender, non-distended  MS: No edema; No deformity. Neuro:  Nonfocal  Psych: Normal affect   Labs    Chemistry Recent Labs Lab 10/31/16 2041  NA 141  K 3.9  CL 104  CO2 30  GLUCOSE 153*  BUN 23*  CREATININE 0.60  CALCIUM 9.3  PROT 7.5  ALBUMIN 3.8  AST 18  ALT 17  ALKPHOS 63  BILITOT 0.3  GFRNONAA >60  GFRAA >60  ANIONGAP 7     Hematology Recent Labs Lab 10/31/16 2041  WBC 5.7  RBC 4.30  HGB 14.0  HCT 43.2  MCV 100.5*  MCH 32.6  MCHC 32.4  RDW  12.0  PLT 200    Cardiac Enzymes Recent Labs Lab 10/31/16 2041 10/31/16 2338  TROPONINI 0.05* 0.14*   No results for input(s): TROPIPOC in the last 168 hours.   BNPNo results for input(s): BNP, PROBNP in the last 168 hours.   DDimer  Recent Labs Lab 10/31/16 2041  DDIMER 1.17*     Radiology    Dg Chest 2 View  Result Date: 10/31/2016 CLINICAL DATA:  Epigastric and chest pain for 1 day. EXAM: CHEST  2 VIEW COMPARISON:  12/02/2013 FINDINGS: Mild linear scarring in the lateral left lung, unchanged. Right lung is clear. Pulmonary vasculature is normal. No pleural effusion. Unchanged mild cardiomegaly. Hilar and mediastinal contours are unremarkable and unchanged. IMPRESSION: Stable cardiomegaly. Stable linear scarring in the lateral left lung. No acute findings. Electronically Signed   By: Andreas Newport M.D.   On: 10/31/2016 21:04    Cardiac Studies  TTE: pending  Patient Profile     83 y.o. female with PMH of NIDDM, GERD, and tobacco use who presented with chest pain to [facility], and transferred to [facility] for further work up.   Assessment & Plan    1. Chest Pain: Patient with intermittent episodes of chest pain in the setting of an elevated troponin level, highly concerned for NSTEMI-ACS. Reports she is very busy caring for her husband and other family members, but has noticed some exertional chest pressure/dyspnea over the past month or so. States when she has these episodes she has to sit down and rest, usually symptoms resolve. Has freq GERD symptoms, but yesterday's were different with sharp pressure in her anterior chest, radiation into her arms and jaws. Trop 0.05>>0.14. Does have RF including Hx of tobacco use, NIDDM, HTN (though she reports she has not officially been dx) - currently on IV heparin, discussed the idea of cardiac catheterization given her recent symptoms, and risk factors, but appears resistant to the idea at this time. Will have MD follow up. - echo  pending. - Started on metoprolol and Lipitor on admission  2. Hypertension: Patient without a known history of hypertension but blood pressure at this time is elevated. States she was on lisinopril in the past but developed angioedema.  - metoprolol started on admission, but has not received a dose yet.   3. Diabetes: Patient with a recent diagnoses of type II diabetes according to documentation.  She currently does not take any medications for diabetes.  Blood sugar at this time is elevated.   - sliding scale insulin.   4. HL: LDL 161, cholesterol 241. Statin ordered on admission, but she seems uninterested in taking at this time. Used red yeast rice in the past.   5. GERD: Reports she frequently has reflux symptoms, controlled with TUMs.  Signed, Reino Bellis, NP  11/01/2016, 9:44 AM

## 2016-11-01 NOTE — Progress Notes (Signed)
ANTICOAGULATION CONSULT NOTE - Initial Consult  Pharmacy Consult for heparin Indication: NSTEMI  Allergies  Allergen Reactions  . Epinephrine     Causes heart palpatations  . Lisinopril     Facial swelling and tongue swelling  . Norvasc [Amlodipine Besylate] Swelling  . Prednisone     Facial swelling    Patient Measurements: Height: 5' (152.4 cm) Weight: 139 lb (63 kg) IBW/kg (Calculated) : 45.5 Heparin Dosing Weight: 60kg  Vital Signs: Temp: 98.2 F (36.8 C) (01/31 1947) Temp Source: Oral (01/31 1947) BP: 145/77 (02/01 0000) Pulse Rate: 77 (02/01 0000)  Labs:  Recent Labs  10/31/16 2041 10/31/16 2338  HGB 14.0  --   HCT 43.2  --   PLT 200  --   CREATININE 0.60  --   TROPONINI 0.05* 0.14*    Estimated Creatinine Clearance: 41.8 mL/min (by C-G formula based on SCr of 0.6 mg/dL).   Medical History: Past Medical History:  Diagnosis Date  . Allergy   . Arthritis   . Diabetes mellitus type 2, uncomplicated ([place]) A999333  . GERD (gastroesophageal reflux disease)   . PVC (premature ventricular contraction)     Assessment: 83yo female c/o CP, troponin elevated and trending up, to beging heparin.  Goal of Therapy:  Heparin level 0.3-0.7 units/ml Monitor platelets by anticoagulation protocol: Yes   Plan:  Will give heparin 3000 units IV bolus x1 followed by gtt at 800 units/hr and monitor heparin levels and CBC.  Wynona Neat, PharmD, BCPS  11/01/2016,12:32 AM

## 2016-11-01 NOTE — Progress Notes (Signed)
Patient given exit care documents on Metoprolol and Lipitor will monitor patient. Leaira Fullam, Bettina Gavia RN

## 2016-11-01 NOTE — Progress Notes (Addendum)
Interventional Cardiology  The patient was seen at the request of Dr. Debara Pickett. The patient presented with a story compatible with unstable angina/non-ST elevation acute coronary syndrome with elevated cardiac markers and coronary angiography was recommended. She requested to discuss cardiac catheterization with a cardiologist older than 81 years of age. She is inclined to avoid invasive evaluation.  Chest pressure with radiation to the neck and arms lasting up to 40 minutes and associated with mildly elevated troponin (0.14 from 0.05).  Exam is unremarkable, with the exception that conversation is quite difficult and tangential.  EKG mildly abnormal with lateral ST-T wave abnormality consistent with ischemia.  Clinical pattern is consistent with NSTEACS, and I agree that invasive evaluation is reasonable. The procedure and risks were discussed in detail. The patient prefers initial conservative evaluation.   Prefers  myocardial perfusion imaging and coronary angiography only if her study is high risk.   Greater than 40 minutes spent with patient and her husband. Greater than 80% of the time spent was in counseling concerning management of her current condition.

## 2016-11-01 NOTE — Progress Notes (Signed)
[place] for heparin Indication: NSTEMI  Allergies  Allergen Reactions  . Epinephrine     Causes heart palpatations  . Lisinopril     Facial swelling and tongue swelling  . Norvasc [Amlodipine Besylate] Swelling  . Prednisone     Facial swelling    Patient Measurements: Height: 5' (152.4 cm) Weight: 139 lb 8 oz (63.3 kg) IBW/kg (Calculated) : 45.5 Heparin Dosing Weight: 60kg  Vital Signs: Temp: 98.4 F (36.9 C) (02/01 1422) Temp Source: Oral (02/01 1422) BP: 125/39 (02/01 1422) Pulse Rate: 71 (02/01 1422)  Labs:  Recent Labs  10/31/16 2041 10/31/16 2338 11/01/16 0858 11/01/16 1652  HGB 14.0  --  14.0  --   HCT 43.2  --  42.9  --   PLT 200  --  194  --   HEPARINUNFRC  --   --  0.62 0.55  CREATININE 0.60  --   --   --   TROPONINI 0.05* 0.14*  --   --     Estimated Creatinine Clearance: 41.9 mL/min (by C-G formula based on SCr of 0.6 mg/dL).   Medical History: Past Medical History:  Diagnosis Date  . Allergy   . Arthritis   . Diabetes mellitus type 2, uncomplicated ([place]) [id number]  . GERD (gastroesophageal reflux disease)   . PVC (premature ventricular contraction)     Assessment: 83yo female c/o CP on heparin for r/o ACS. Confirmation heparin level is therapeutic. At present pt desired to pursue medication management.   Goal of Therapy:  Heparin level 0.3-0.7 units/ml Monitor platelets by anticoagulation protocol: Yes   Plan:  1. Continue heparin at 800 units/hr 2. Daily heparin level and CBC   Vincenza Hews, PharmD, BCPS 11/01/2016, 5:30 PM

## 2016-11-01 NOTE — Progress Notes (Deleted)
QTC by Ekg  508. Tommye Standard pac made aware through [facility] system will monitor patient. Lowana Hable, Bettina Gavia RN

## 2016-11-01 NOTE — H&P (Signed)
History & Physical    Patient ID: Jaime Mooney MRN: [id number], DOB/AGE: 12-05-1928   Admit date: 10/31/2016   Primary Physician: Lamar Blinks, MD Primary Cardiologist: Fransico Him, MD   History of Present Illness    Jaime Mooney is a 83 y.o. female with past medical history of non-insulin dependent diabetes, gastroesophageal reflux disease, and remote history of smoking is here today with complaints of chest pain.  Jaime Mooney states at around 4 pm today while at home, she first started noticing pain that went from her upper stomach to her neck.  The pain did go to both arms and shoulders.  She describes the pain as sharp, 10/10 in intensity, and was made better initially with tums taken at home.  Her first episode of pain lasted only 2 minutes.  She had another episode of pain at around 6:30 pm that was similar but the tums did not help relieve her symptoms.  Her second episode of pain lasted around 45 minutes and this time she had shortness of breath.  Patient eventually got her husband to take her to her local emergency department.  Patient is currently chest pain free at this time.  She does state that every time she walks now, her chest pain symptoms come back slightly.  Patient reports a similar episode about 2 weeks ago.    Of note, Jaime Mooney states that she is no longer taking any medications.  Patient stopped taking medications over the last 6 months due to having to take care of others and forgetting to continue her medications.  Also, patient states that she is suppose to see a gastroenterologist to do scope for her reflux disease.    Past Medical History    Past Medical History:  Diagnosis Date  . Allergy   . Arthritis   . Diabetes mellitus type 2, uncomplicated ([place]) [id number]  . GERD (gastroesophageal reflux disease)   . PVC (premature ventricular contraction)     Past Surgical History:  Procedure Laterality Date  . BREAST SURGERY    . CHOLECYSTECTOMY      . EYE SURGERY     cataracts bilateral   . FRACTURE SURGERY    . gallbladder surgery 5 years ago    . left breast surgery 20 yrs ago    . nose jfracture 16 yeras ago    . right breast surgery 2012       Allergies  Allergies  Allergen Reactions  . Epinephrine     Causes heart palpatations  . Lisinopril     Facial swelling and tongue swelling  . Norvasc [Amlodipine Besylate] Swelling  . Prednisone     Facial swelling     Home Medications    Prior to Admission medications   Not on File    Family History    Family History  Problem Relation Age of Onset  . Cancer Mother   . Cancer Father   . Diabetes Father   . Heart disease Father   . Stroke Father   . Cancer Sister   . Heart disease Maternal Grandmother   . Heart disease Maternal Grandfather   . Heart disease Paternal Grandmother   . Diabetes Paternal Grandfather     Social History    Social History   Social History  . Marital status: Married    Spouse name: N/A  . Number of children: N/A  . Years of education: N/A   Occupational History  . Unemployed    Social  History Main Topics  . Smoking status: Former Research scientist (life sciences)  . Smokeless tobacco: Never Used  . Alcohol use Yes     Comment: 7 drinks per week  . Drug use: No  . Sexual activity: Not on file   Other Topics Concern  . Not on file   Social History Narrative   Married. Education: Other.     Review of Systems   All other systems reviewed and are otherwise negative except as noted above.  Physical Exam    Blood pressure (!) 155/55, pulse 78, temperature 97.6 F (36.4 C), temperature source Oral, resp. rate 18, height 5' (1.524 m), weight 63.3 kg (139 lb 8 oz), SpO2 99 %.  General: Well developed, well nourished,female in no acute distress. Head: Normocephalic, atraumatic, sclera non-icteric, no xanthomas, nares are without discharge. Dentition:  Neck: No carotid bruits. JVD not elevated.  Lungs: Respirations regular and unlabored, without  wheezes or rales.  Heart: Regular rate and rhythm. No S3 or S4.  No murmur, no rubs, or gallops appreciated. Abdomen: Soft, non-tender, non-distended with normoactive bowel sounds. No hepatomegaly. No rebound/guarding. No obvious abdominal masses. Msk:  Strength and tone appear normal for age. No joint deformities or effusions. Extremities: No clubbing or cyanosis. No edema.  Distal pedal pulses are 2+ bilaterally. Neuro: Alert and oriented X 3. Moves all extremities spontaneously. No focal deficits noted. Psych:  Responds to questions appropriately with a normal affect. Skin: No rashes or lesions noted  Labs    Troponin (Point of Care Test) No results for input(s): TROPIPOC in the last 72 hours.  Recent Labs  10/31/16 2041 10/31/16 2338  TROPONINI 0.05* 0.14*   Lab Results  Component Value Date   WBC 5.7 10/31/2016   HGB 14.0 10/31/2016   HCT 43.2 10/31/2016   MCV 100.5 (H) 10/31/2016   PLT 200 10/31/2016    Recent Labs Lab 10/31/16 2041  NA 141  K 3.9  CL 104  CO2 30  BUN 23*  CREATININE 0.60  CALCIUM 9.3  PROT 7.5  BILITOT 0.3  ALKPHOS 63  ALT 17  AST 18  GLUCOSE 153*   Lab Results  Component Value Date   CHOL 218 (H) 06/15/2016   HDL 47.10 06/15/2016   LDLCALC 144 (H) 06/15/2016   TRIG 132.0 06/15/2016   Lab Results  Component Value Date   DDIMER 1.17 (H) 10/31/2016    No results found for: BNP No results found for: PROBNP No results for input(s): INR in the last 72 hours.    Radiology Studies    Dg Chest 2 View  Result Date: 10/31/2016 CLINICAL DATA:  Epigastric and chest pain for 1 day. EXAM: CHEST  2 VIEW COMPARISON:  12/02/2013 FINDINGS: Mild linear scarring in the lateral left lung, unchanged. Right lung is clear. Pulmonary vasculature is normal. No pleural effusion. Unchanged mild cardiomegaly. Hilar and mediastinal contours are unremarkable and unchanged. IMPRESSION: Stable cardiomegaly. Stable linear scarring in the lateral left lung. No  acute findings. Electronically Signed   By: Andreas Newport M.D.   On: 10/31/2016 21:04    EKG & Cardiac Imaging    EKG: NSR; right-axis deviation, occassional PVCs.     ECHOCARDIOGRAM: 11/22/14 - Left ventricle: The cavity size was normal. Wall thickness was normal. Systolic function was normal. The estimated ejection fraction was in the range of 55% to 60%. Wall motion was normal; there were no regional wall motion abnormalities. - Left atrium: The atrium was mildly dilated.  Assessment & Plan  Active Problems:   Chest pain   Non-STEMI (non-ST elevated myocardial infarction) (HCC)   Dyspnea   NSTEMI (non-ST elevated myocardial infarction) ([place])  # Chest Pain: Patient with intermittent episodes of chest pain in the setting of an elevated troponin level, highly concerned for NSTEMI-ACS.  Her troponin has trended up slightly and her ECG is without any signs of myocardial ischemia/infarction.  She is now chest pain free.  Patient received aspirin and started on heparin drip prior to being admitted. - Continue aspirin and heparin drip for now. - Consider left heart catheterization. - Start metoprolol and Lipitor.  # Hypertension: Patient without a known history of hypertension but blood pressure at this time is elevated.  Patient likely has underlying essential hypertension.  She does not have any overt end-organ damage at this time.   - Start metoprolol for now.    # Diabetes: Patient with a recent diagnoses of type II diabetes according to documentation.  She currently does not take any medications for diabetes.  Blood sugar at this time is elevated.   - Start sliding scale insulin.   # Prophylaxis Heparin drip  Signed, Jon Billings, MD 11/01/2016, 5:46 AM

## 2016-11-01 NOTE — Progress Notes (Signed)
  Echocardiogram 2D Echocardiogram has been performed.  Jaime Mooney 11/01/2016, 10:39 AM

## 2016-11-01 NOTE — Progress Notes (Signed)
Patient received from [facility] via [place]. Pt alert and oriented and pain free at this time. On Heparin gtt that was started by [facility].  Dr. Koleen Nimrod with cardiology paged and notified of pt arrival and of need for admit orders. Oriented patient to room and call bell. Bed alarm turned on for pt safety due to fall risk. Advised pt to notify RN if chest pain returns.

## 2016-11-01 NOTE — Progress Notes (Signed)
[place] for heparin Indication: NSTEMI  Allergies  Allergen Reactions  . Epinephrine     Causes heart palpatations  . Lisinopril     Facial swelling and tongue swelling  . Norvasc [Amlodipine Besylate] Swelling  . Prednisone     Facial swelling    Patient Measurements: Height: 5' (152.4 cm) Weight: 139 lb 8 oz (63.3 kg) IBW/kg (Calculated) : 45.5 Heparin Dosing Weight: 60kg  Vital Signs: Temp: 97.6 F (36.4 C) (02/01 0255) Temp Source: Oral (02/01 0255) BP: 155/55 (02/01 0500) Pulse Rate: 78 (02/01 0255)  Labs:  Recent Labs  10/31/16 2041 10/31/16 2338 11/01/16 0858  HGB 14.0  --  14.0  HCT 43.2  --  42.9  PLT 200  --  194  HEPARINUNFRC  --   --  0.62  CREATININE 0.60  --   --   TROPONINI 0.05* 0.14*  --     Estimated Creatinine Clearance: 41.9 mL/min (by C-G formula based on SCr of 0.6 mg/dL).   Medical History: Past Medical History:  Diagnosis Date  . Allergy   . Arthritis   . Diabetes mellitus type 2, uncomplicated ([place]) [id number]  . GERD (gastroesophageal reflux disease)   . PVC (premature ventricular contraction)     Assessment: 83yo female c/o CP on heparin for r/o ACS. Initial heparin level is at goal. Possible plans for cath.  Goal of Therapy:  Heparin level 0.3-0.7 units/ml Monitor platelets by anticoagulation protocol: Yes   Plan:  No heparin changes needed Will confirm a heparin level later today Will follow cath plans  Hildred Laser, Pharm D 11/01/2016 10:11 AM

## 2016-11-02 ENCOUNTER — Encounter (HOSPITAL_COMMUNITY)
Admission: EM | Disposition: A | Discharge status: Home / Self Care | Payer: Self-pay | Source: Home / Self Care | Attending: Cardiology

## 2016-11-02 DIAGNOSIS — I2510 Atherosclerotic heart disease of native coronary artery without angina pectoris: Secondary | ICD-10-CM

## 2016-11-02 DIAGNOSIS — I208 Other forms of angina pectoris: Secondary | ICD-10-CM

## 2016-11-02 DIAGNOSIS — I200 Unstable angina: Secondary | ICD-10-CM

## 2016-11-02 HISTORY — PX: CARDIAC CATHETERIZATION: SHX172

## 2016-11-02 LAB — CBC
HEMATOCRIT: 39.7 % (ref 36.0–46.0)
HEMATOCRIT: 42.4 % (ref 36.0–46.0)
Hemoglobin: 13 g/dL (ref 12.0–15.0)
Hemoglobin: 14 g/dL (ref 12.0–15.0)
MCH: 32.1 pg (ref 26.0–34.0)
MCH: 32 pg (ref 26.0–34.0)
MCHC: 32.7 g/dL (ref 30.0–36.0)
MCHC: 33 g/dL (ref 30.0–36.0)
MCV: 98 fL (ref 78.0–100.0)
MCV: 97 fL (ref 78.0–100.0)
PLATELETS: 179 10*3/uL (ref 150–400)
Platelets: 187 10*3/uL (ref 150–400)
RBC: 4.05 MIL/uL (ref 3.87–5.11)
RBC: 4.37 MIL/uL (ref 3.87–5.11)
RDW: 12.3 % (ref 11.5–15.5)
RDW: 12.4 % (ref 11.5–15.5)
WBC: 5.5 10*3/uL (ref 4.0–10.5)
WBC: 5.8 10*3/uL (ref 4.0–10.5)

## 2016-11-02 LAB — GLUCOSE, CAPILLARY
GLUCOSE-CAPILLARY: 230 mg/dL — AB (ref 65–99)
Glucose-Capillary: 145 mg/dL — ABNORMAL HIGH (ref 65–99)
Glucose-Capillary: 134 mg/dL — ABNORMAL HIGH (ref 65–99)
Glucose-Capillary: 96 mg/dL (ref 65–99)

## 2016-11-02 LAB — HEMOGLOBIN A1C
Hgb A1c MFr Bld: 6.6 % — ABNORMAL HIGH (ref 4.8–5.6)
MEAN PLASMA GLUCOSE: 143 mg/dL

## 2016-11-02 LAB — PROTIME-INR
INR: 1.1
PROTHROMBIN TIME: 14.2 s (ref 11.4–15.2)

## 2016-11-02 LAB — CREATININE, SERUM
Creatinine, Ser: 0.54 mg/dL (ref 0.44–1.00)
GFR calc Af Amer: >60 mL/min (ref >60)
GFR calc non Af Amer: >60 mL/min (ref >60)

## 2016-11-02 LAB — HEPARIN LEVEL (UNFRACTIONATED): Heparin Unfractionated: 0.6 IU/mL (ref 0.30–0.70)

## 2016-11-02 SURGERY — LEFT HEART CATH AND CORONARY ANGIOGRAPHY
Anesthesia: LOCAL

## 2016-11-02 MED ORDER — SODIUM CHLORIDE 0.9% FLUSH: 3.0000 mL | INTRAVENOUS | Status: DC | PRN

## 2016-11-02 MED ORDER — SODIUM CHLORIDE 0.9 % IV SOLN: 250.0000 mL | INTRAVENOUS | Status: DC | PRN

## 2016-11-02 MED ORDER — SODIUM CHLORIDE 0.9 % WEIGHT BASED INFUSION
3.0000 mL/kg/h | INTRAVENOUS | Status: DC
  Administered 2016-11-02: 3 mL/kg/h via INTRAVENOUS

## 2016-11-02 MED ORDER — ONDANSETRON HCL 4 MG/2ML IJ SOLN: 4.0000 mg | Freq: Four times a day (QID) | INTRAMUSCULAR | Status: DC | PRN

## 2016-11-02 MED ORDER — HEPARIN SODIUM (PORCINE) 5000 UNIT/ML IJ SOLN
5000.0000 [IU] | Freq: Three times a day (TID) | INTRAMUSCULAR | Status: DC
  Administered 2016-11-03: 5000 [IU] via SUBCUTANEOUS
  Filled 2016-11-02: qty 1

## 2016-11-02 MED ORDER — VERAPAMIL HCL 2.5 MG/ML IV SOLN
INTRAVENOUS | Status: DC | PRN
  Administered 2016-11-02: 10 mL via INTRA_ARTERIAL

## 2016-11-02 MED ORDER — SODIUM CHLORIDE 0.9 % IV SOLN
INTRAVENOUS | Status: AC
  Administered 2016-11-02 (×2): via INTRAVENOUS

## 2016-11-02 MED ORDER — OXYCODONE-ACETAMINOPHEN 5-325 MG PO TABS: 1.0000 | ORAL_TABLET | ORAL | Status: DC | PRN

## 2016-11-02 MED ORDER — HEPARIN SODIUM (PORCINE) 1000 UNIT/ML IJ SOLN
INTRAMUSCULAR | Status: DC | PRN
  Administered 2016-11-02: 2000 [IU] via INTRAVENOUS

## 2016-11-02 MED ORDER — FENTANYL CITRATE (PF) 100 MCG/2ML IJ SOLN
INTRAMUSCULAR | Status: AC
  Filled 2016-11-02: qty 2

## 2016-11-02 MED ORDER — MIDAZOLAM HCL 2 MG/2ML IJ SOLN
INTRAMUSCULAR | Status: DC | PRN
  Administered 2016-11-02: 1 mg via INTRAVENOUS

## 2016-11-02 MED ORDER — ASPIRIN 81 MG PO CHEW: 81.0000 mg | CHEWABLE_TABLET | Freq: Every day | ORAL | Status: DC

## 2016-11-02 MED ORDER — SODIUM CHLORIDE 0.9 % WEIGHT BASED INFUSION: 1.0000 mL/kg/h | INTRAVENOUS | Status: DC

## 2016-11-02 MED ORDER — LIDOCAINE HCL (PF) 1 % IJ SOLN
INTRAMUSCULAR | Status: DC | PRN
  Administered 2016-11-02: 2 mL

## 2016-11-02 MED ORDER — ALUM & MAG HYDROXIDE-SIMETH 200-200-20 MG/5ML PO SUSP
30.0000 mL | ORAL | Status: DC | PRN
  Administered 2016-11-02: 30 mL via ORAL
  Filled 2016-11-02: qty 30

## 2016-11-02 MED ORDER — IOPAMIDOL (ISOVUE-370) INJECTION 76%
INTRAVENOUS | Status: AC
  Filled 2016-11-02: qty 100

## 2016-11-02 MED ORDER — MIDAZOLAM HCL 2 MG/2ML IJ SOLN
INTRAMUSCULAR | Status: AC
  Filled 2016-11-02: qty 2

## 2016-11-02 MED ORDER — HYDRALAZINE HCL 20 MG/ML IJ SOLN
INTRAMUSCULAR | Status: AC
  Filled 2016-11-02: qty 1

## 2016-11-02 MED ORDER — FENTANYL CITRATE (PF) 100 MCG/2ML IJ SOLN
INTRAMUSCULAR | Status: DC | PRN
  Administered 2016-11-02: 50 ug via INTRAVENOUS

## 2016-11-02 MED ORDER — HYDRALAZINE HCL 20 MG/ML IJ SOLN
INTRAMUSCULAR | Status: DC | PRN
  Administered 2016-11-02: 10 mg via INTRAVENOUS

## 2016-11-02 MED ORDER — HEPARIN (PORCINE) IN NACL 2-0.9 UNIT/ML-% IJ SOLN
INTRAMUSCULAR | Status: DC | PRN
  Administered 2016-11-02: 18:00:00

## 2016-11-02 MED ORDER — HEPARIN (PORCINE) IN NACL 2-0.9 UNIT/ML-% IJ SOLN
INTRAMUSCULAR | Status: AC
  Filled 2016-11-02: qty 1000

## 2016-11-02 MED ORDER — ACETAMINOPHEN 325 MG PO TABS: 650.0000 mg | ORAL_TABLET | ORAL | Status: DC | PRN

## 2016-11-02 MED ORDER — SODIUM CHLORIDE 0.9% FLUSH: 3.0000 mL | Freq: Two times a day (BID) | INTRAVENOUS | Status: DC

## 2016-11-02 MED ORDER — LIDOCAINE HCL (PF) 1 % IJ SOLN
INTRAMUSCULAR | Status: AC
  Filled 2016-11-02: qty 30

## 2016-11-02 MED ORDER — VERAPAMIL HCL 2.5 MG/ML IV SOLN
INTRAVENOUS | Status: AC
  Filled 2016-11-02: qty 2

## 2016-11-02 MED ORDER — SODIUM CHLORIDE 0.9% FLUSH
3.0000 mL | Freq: Two times a day (BID) | INTRAVENOUS | Status: DC
  Administered 2016-11-02: 3 mL via INTRAVENOUS

## 2016-11-02 MED ORDER — HEPARIN SODIUM (PORCINE) 1000 UNIT/ML IJ SOLN
INTRAMUSCULAR | Status: AC
  Filled 2016-11-02: qty 1

## 2016-11-02 MED ORDER — ASPIRIN 81 MG PO CHEW: 81.0000 mg | CHEWABLE_TABLET | ORAL | Status: DC

## 2016-11-02 SURGICAL SUPPLY — 11 items
CATH INFINITI 5 FR JL3.5 (CATHETERS) ×2 IMPLANT
CATH INFINITI JR4 5F (CATHETERS) ×2 IMPLANT
DEVICE RAD COMP TR BAND LRG (VASCULAR PRODUCTS) ×2 IMPLANT
GLIDESHEATH SLEND A-KIT 6F 22G (SHEATH) ×2 IMPLANT
GUIDEWIRE INQWIRE 1.5J.035X260 (WIRE) ×1 IMPLANT
INQWIRE 1.5J .035X260CM (WIRE) ×2
KIT HEART LEFT (KITS) ×2 IMPLANT
PACK CARDIAC CATHETERIZATION (CUSTOM PROCEDURE TRAY) ×2 IMPLANT
TRANSDUCER W/STOPCOCK (MISCELLANEOUS) ×2 IMPLANT
TUBING CIL FLEX 10 FLL-RA (TUBING) ×2 IMPLANT
WIRE HI TORQ VERSACORE-J 145CM (WIRE) ×2 IMPLANT

## 2016-11-02 NOTE — Progress Notes (Signed)
Pt reported some improvement after first ngt tablet, but still experiencing symptoms so second tablet administered. After second tablet pt reports that her pain is improved and she "feels much better." BP 107/55 after second ntg tablet.

## 2016-11-02 NOTE — Interval H&P Note (Signed)
Cath Lab Visit (complete for each Cath Lab visit)  Clinical Evaluation Leading to the Procedure:   ACS: Yes.    Non-ACS:    Anginal Classification: CCS IV  Anti-ischemic medical therapy: Minimal Therapy (1 class of medications)  Non-Invasive Test Results: No non-invasive testing performed  Prior CABG: No previous CABG      History and Physical Interval Note:  11/02/2016 5:05 PM  Jaime Mooney  has presented today for surgery, with the diagnosis of cp  The various methods of treatment have been discussed with the patient and family. After consideration of risks, benefits and other options for treatment, the patient has consented to  Procedure(s): Left Heart Cath and Coronary Angiography (N/A) as a surgical intervention .  The patient's history has been reviewed, patient examined, no change in status, stable for surgery.  I have reviewed the patient's chart and labs.  Questions were answered to the patient's satisfaction.     Jaime Mooney

## 2016-11-02 NOTE — Progress Notes (Signed)
Pt c/o chest pain/pressure midline from sternum to throat.  Reviewied Pt PRN orders, standing order for maalox entered.  Pt given maalox.  Pt immediately states chest feels better, asking for another dose.  Asked Pt to wait and see how the medicine works, if further intervention needed will ask for GI cocktail.  Pt indicates understanding, will cont to monitor.

## 2016-11-02 NOTE — Progress Notes (Signed)
[place] for heparin Indication: NSTEMI  Allergies  Allergen Reactions  . Epinephrine     Causes heart palpatations  . Lisinopril     Facial swelling and tongue swelling  . Norvasc [Amlodipine Besylate] Swelling  . Prednisone     Facial swelling    Patient Measurements: Height: 5' (152.4 cm) Weight: 139 lb 8 oz (63.3 kg) IBW/kg (Calculated) : 45.5 Heparin Dosing Weight: 60kg  Vital Signs: Temp: 97.4 F (36.3 C) (02/02 0437) Temp Source: Oral (02/02 0437) BP: 107/55 (02/02 0611) Pulse Rate: 82 (02/02 0611)  Labs:  Recent Labs  10/31/16 2041 10/31/16 2338 11/01/16 0858 11/01/16 1652 11/02/16 0335  HGB 14.0  --  14.0  --  13.0  HCT 43.2  --  42.9  --  39.7  PLT 200  --  194  --  187  HEPARINUNFRC  --   --  0.62 0.55 0.60  CREATININE 0.60  --   --   --   --   TROPONINI 0.05* 0.14*  --   --   --     Estimated Creatinine Clearance: 41.9 mL/min (by C-G formula based on SCr of 0.6 mg/dL).   Assessment: 83yo female c/o CP on heparin for r/o ACS.  Heparin level is therapeutic and CBC stable.  No bleeding reported.  For cardiac cath today.   Goal of Therapy:  Heparin level 0.3-0.7 units/ml Monitor platelets by anticoagulation protocol: Yes   Plan:  1. Continue heparin at 800 units/hr 2. Daily heparin level and CBC 3. F/u after cath today  Eudelia Bunch, Pharm.D. [id number] 11/02/2016 10:51 AM

## 2016-11-02 NOTE — Progress Notes (Signed)
DAILY PROGRESS NOTE  Subjective:  More chest pain today - did not take to nuc med because of this. Nitrate response. She is now agreeable to heart catheterization today.  Objective:  Temp:  [97.4 F (36.3 C)-98.4 F (36.9 C)] 97.4 F (36.3 C) (02/02 0437) Pulse Rate:  [66-82] 82 (02/02 0611) Resp:  [18] 18 (02/02 0437) BP: (107-189)/(39-62) 107/55 (02/02 0611) SpO2:  [97 %-98 %] 97 % (02/02 0538) Weight change:   Intake/Output from previous day: 02/01 0701 - 02/02 0700 In: 104 [I.V.:104] Out: -   Intake/Output from this shift: No intake/output data recorded.  Medications: No current facility-administered medications on file prior to encounter.    No current outpatient prescriptions on file prior to encounter.    Physical Exam: General appearance: alert and no distress Lungs: clear to auscultation bilaterally Heart: regular rate and rhythm, S1, S2 normal, no murmur, click, rub or gallop Extremities: extremities normal, atraumatic, no cyanosis or edema Skin: Skin color, texture, turgor normal. No rashes or lesions  Lab Results: Results for orders placed or performed during the hospital encounter of 10/31/16 (from the past 48 hour(s))  Comprehensive metabolic panel     Status: Abnormal   Collection Time: 10/31/16  8:41 PM  Result Value Ref Range   Sodium 141 135 - 145 mmol/L   Potassium 3.9 3.5 - 5.1 mmol/L   Chloride 104 101 - 111 mmol/L   CO2 30 22 - 32 mmol/L   Glucose, Bld 153 (H) 65 - 99 mg/dL   BUN 23 (H) 6 - 20 mg/dL   Creatinine, Ser 0.60 0.44 - 1.00 mg/dL   Calcium 9.3 8.9 - 10.3 mg/dL   Total Protein 7.5 6.5 - 8.1 g/dL   Albumin 3.8 3.5 - 5.0 g/dL   AST 18 15 - 41 U/L   ALT 17 14 - 54 U/L   Alkaline Phosphatase 63 38 - 126 U/L   Total Bilirubin 0.3 0.3 - 1.2 mg/dL   GFR calc non Af Amer >60 >60 mL/min   GFR calc Af Amer >60 >60 mL/min    Comment: (NOTE) The eGFR has been calculated using the CKD EPI equation. This calculation has not been  validated in all clinical situations. eGFR's persistently <60 mL/min signify possible Chronic Kidney Disease.    Anion gap 7 5 - 15  CBC with Differential     Status: Abnormal   Collection Time: 10/31/16  8:41 PM  Result Value Ref Range   WBC 5.7 4.0 - 10.5 K/uL   RBC 4.30 3.87 - 5.11 MIL/uL   Hemoglobin 14.0 12.0 - 15.0 g/dL   HCT 43.2 36.0 - 46.0 %   MCV 100.5 (H) 78.0 - 100.0 fL   MCH 32.6 26.0 - 34.0 pg   MCHC 32.4 30.0 - 36.0 g/dL   RDW 12.0 11.5 - 15.5 %   Platelets 200 150 - 400 K/uL   Neutrophils Relative % 60 %   Neutro Abs 3.4 1.7 - 7.7 K/uL   Lymphocytes Relative 29 %   Lymphs Abs 1.6 0.7 - 4.0 K/uL   Monocytes Relative 9 %   Monocytes Absolute 0.5 0.1 - 1.0 K/uL   Eosinophils Relative 2 %   Eosinophils Absolute 0.1 0.0 - 0.7 K/uL   Basophils Relative 0 %   Basophils Absolute 0.0 0.0 - 0.1 K/uL  Troponin I     Status: Abnormal   Collection Time: 10/31/16  8:41 PM  Result Value Ref Range   Troponin I 0.05 (  HH) <0.03 ng/mL    Comment: CRITICAL RESULT CALLED TO, READ BACK BY AND VERIFIED WITH: NEAL,K AT 2128 ON [id number] BY CHERESNOWSKY,T   D-dimer, quantitative (not at [facility])     Status: Abnormal   Collection Time: 10/31/16  8:41 PM  Result Value Ref Range   D-Dimer, Quant 1.17 (H) 0.00 - 0.50 ug/mL-FEU    Comment: (NOTE) At the manufacturer cut-off of 0.50 ug/mL FEU, this assay has been documented to exclude PE with a sensitivity and negative predictive value of 97 to 99%.  At this time, this assay has not been approved by the FDA to exclude DVT/VTE. Results should be correlated with clinical presentation.   Troponin I     Status: Abnormal   Collection Time: 10/31/16 11:38 PM  Result Value Ref Range   Troponin I 0.14 (HH) <0.03 ng/mL    Comment: CRITICAL RESULT CALLED TO, READ BACK BY AND VERIFIED WITH: NEAL,K AT 0010 ON [id number] BY CHERESNOWSKY,T   Hemoglobin A1c     Status: Abnormal   Collection Time: 11/01/16  6:18 AM  Result Value Ref Range   Hgb A1c MFr  Bld 6.6 (H) 4.8 - 5.6 %    Comment: (NOTE)         Pre-diabetes: 5.7 - 6.4         Diabetes: >6.4         Glycemic control for adults with diabetes: <7.0    Mean Plasma Glucose 143 mg/dL    Comment: (NOTE) Performed At: [facility] [place], [place] [id number] Lindon Romp MD YQ:[id number]   Lipid panel     Status: Abnormal   Collection Time: 11/01/16  6:18 AM  Result Value Ref Range   Cholesterol 241 (H) 0 - 200 mg/dL   Triglycerides 151 (H) <150 mg/dL   HDL 50 >40 mg/dL   Total CHOL/HDL Ratio 4.8 RATIO   VLDL 30 0 - 40 mg/dL   LDL Cholesterol 161 (H) 0 - 99 mg/dL    Comment:        Total Cholesterol/HDL:CHD Risk Coronary Heart Disease Risk Table                     Men   Women  1/2 Average Risk   3.4   3.3  Average Risk       5.0   4.4  2 X Average Risk   9.6   7.1  3 X Average Risk  23.4   11.0        Use the calculated Patient Ratio above and the CHD Risk Table to determine the patient's CHD Risk.        ATP III CLASSIFICATION (LDL):  <100     mg/dL   Optimal  100-129  mg/dL   Near or Above                    Optimal  130-159  mg/dL   Borderline  160-189  mg/dL   High  >190     mg/dL   Very High   Glucose, capillary     Status: Abnormal   Collection Time: 11/01/16  6:22 AM  Result Value Ref Range   Glucose-Capillary 134 (H) 65 - 99 mg/dL  Heparin level (unfractionated)     Status: None   Collection Time: 11/01/16  8:58 AM  Result Value Ref Range   Heparin Unfractionated 0.62 0.30 - 0.70 IU/mL    Comment:  IF HEPARIN RESULTS ARE BELOW EXPECTED VALUES, AND PATIENT DOSAGE HAS BEEN CONFIRMED, SUGGEST FOLLOW UP TESTING OF ANTITHROMBIN III LEVELS.   CBC     Status: None   Collection Time: 11/01/16  8:58 AM  Result Value Ref Range   WBC 5.0 4.0 - 10.5 K/uL   RBC 4.41 3.87 - 5.11 MIL/uL   Hemoglobin 14.0 12.0 - 15.0 g/dL   HCT 42.9 36.0 - 46.0 %   MCV 97.3 78.0 - 100.0 fL   MCH 31.7 26.0 - 34.0 pg   MCHC 32.6 30.0 - 36.0  g/dL   RDW 12.1 11.5 - 15.5 %   Platelets 194 150 - 400 K/uL  Glucose, capillary     Status: Abnormal   Collection Time: 11/01/16 11:59 AM  Result Value Ref Range   Glucose-Capillary 128 (H) 65 - 99 mg/dL   Comment 1 Notify RN    Comment 2 Document in Chart   Glucose, capillary     Status: Abnormal   Collection Time: 11/01/16  3:58 PM  Result Value Ref Range   Glucose-Capillary 139 (H) 65 - 99 mg/dL  Heparin level (unfractionated)     Status: None   Collection Time: 11/01/16  4:52 PM  Result Value Ref Range   Heparin Unfractionated 0.55 0.30 - 0.70 IU/mL    Comment:        IF HEPARIN RESULTS ARE BELOW EXPECTED VALUES, AND PATIENT DOSAGE HAS BEEN CONFIRMED, SUGGEST FOLLOW UP TESTING OF ANTITHROMBIN III LEVELS.   Glucose, capillary     Status: Abnormal   Collection Time: 11/01/16 10:19 PM  Result Value Ref Range   Glucose-Capillary 191 (H) 65 - 99 mg/dL   Comment 1 Notify RN    Comment 2 Document in Chart   CBC     Status: None   Collection Time: 11/02/16  3:35 AM  Result Value Ref Range   WBC 5.5 4.0 - 10.5 K/uL   RBC 4.05 3.87 - 5.11 MIL/uL   Hemoglobin 13.0 12.0 - 15.0 g/dL   HCT 39.7 36.0 - 46.0 %   MCV 98.0 78.0 - 100.0 fL   MCH 32.1 26.0 - 34.0 pg   MCHC 32.7 30.0 - 36.0 g/dL   RDW 12.3 11.5 - 15.5 %   Platelets 187 150 - 400 K/uL  Heparin level (unfractionated)     Status: None   Collection Time: 11/02/16  3:35 AM  Result Value Ref Range   Heparin Unfractionated 0.60 0.30 - 0.70 IU/mL    Comment:        IF HEPARIN RESULTS ARE BELOW EXPECTED VALUES, AND PATIENT DOSAGE HAS BEEN CONFIRMED, SUGGEST FOLLOW UP TESTING OF ANTITHROMBIN III LEVELS.   Glucose, capillary     Status: Abnormal   Collection Time: 11/02/16  6:53 AM  Result Value Ref Range   Glucose-Capillary 145 (H) 65 - 99 mg/dL   Comment 1 Notify RN     Imaging: Dg Chest 2 View  Result Date: 10/31/2016 CLINICAL DATA:  Epigastric and chest pain for 1 day. EXAM: CHEST  2 VIEW COMPARISON:   12/02/2013 FINDINGS: Mild linear scarring in the lateral left lung, unchanged. Right lung is clear. Pulmonary vasculature is normal. No pleural effusion. Unchanged mild cardiomegaly. Hilar and mediastinal contours are unremarkable and unchanged. IMPRESSION: Stable cardiomegaly. Stable linear scarring in the lateral left lung. No acute findings. Electronically Signed   By: Andreas Newport M.D.   On: 10/31/2016 21:04    Assessment:  1. Active Problems: 2.  Chest pain 3.   Non-STEMI (non-ST elevated myocardial infarction) ([place]) 4.   Dyspnea 5.   NSTEMI (non-ST elevated myocardial infarction) ([place]) 6.   Plan:  1. Unstable angina again today - relieved with nitrates. NSTEMI, troponin up to 0.14. Again discussed left heart catheterization and she is agreeable to this today. She discussed the procedure with Dr. Tamala Julian yesterday and feels confident with him doing it.   Time Spent Directly with Patient:  15 minutes  Length of Stay:  LOS: 1 day   Pixie Casino, MD, [facility] Attending Cardiologist [place] 11/02/2016, 10:12 AM

## 2016-11-02 NOTE — Progress Notes (Signed)
Reassessed Pt chest discomfort.  Per Pt, discomfort has completely resolved.  Educated Pt that she may have more maalox at 0100 if discomfort should return.  Pt states understanding.

## 2016-11-02 NOTE — H&P (View-Only) (Signed)
DAILY PROGRESS NOTE  Subjective:  More chest pain today - did not take to nuc med because of this. Nitrate response. She is now agreeable to heart catheterization today.  Objective:  Temp:  [97.4 F (36.3 C)-98.4 F (36.9 C)] 97.4 F (36.3 C) (02/02 0437) Pulse Rate:  [66-82] 82 (02/02 0611) Resp:  [18] 18 (02/02 0437) BP: (107-189)/(39-62) 107/55 (02/02 0611) SpO2:  [97 %-98 %] 97 % (02/02 0538) Weight change:   Intake/Output from previous day: 02/01 0701 - 02/02 0700 In: 104 [I.V.:104] Out: -   Intake/Output from this shift: No intake/output data recorded.  Medications: No current facility-administered medications on file prior to encounter.    No current outpatient prescriptions on file prior to encounter.    Physical Exam: General appearance: alert and no distress Lungs: clear to auscultation bilaterally Heart: regular rate and rhythm, S1, S2 normal, no murmur, click, rub or gallop Extremities: extremities normal, atraumatic, no cyanosis or edema Skin: Skin color, texture, turgor normal. No rashes or lesions  Lab Results: Results for orders placed or performed during the hospital encounter of 10/31/16 (from the past 48 hour(s))  Comprehensive metabolic panel     Status: Abnormal   Collection Time: 10/31/16  8:41 PM  Result Value Ref Range   Sodium 141 135 - 145 mmol/L   Potassium 3.9 3.5 - 5.1 mmol/L   Chloride 104 101 - 111 mmol/L   CO2 30 22 - 32 mmol/L   Glucose, Bld 153 (H) 65 - 99 mg/dL   BUN 23 (H) 6 - 20 mg/dL   Creatinine, Ser 0.60 0.44 - 1.00 mg/dL   Calcium 9.3 8.9 - 10.3 mg/dL   Total Protein 7.5 6.5 - 8.1 g/dL   Albumin 3.8 3.5 - 5.0 g/dL   AST 18 15 - 41 U/L   ALT 17 14 - 54 U/L   Alkaline Phosphatase 63 38 - 126 U/L   Total Bilirubin 0.3 0.3 - 1.2 mg/dL   GFR calc non Af Amer >60 >60 mL/min   GFR calc Af Amer >60 >60 mL/min    Comment: (NOTE) The eGFR has been calculated using the CKD EPI equation. This calculation has not been  validated in all clinical situations. eGFR's persistently <60 mL/min signify possible Chronic Kidney Disease.    Anion gap 7 5 - 15  CBC with Differential     Status: Abnormal   Collection Time: 10/31/16  8:41 PM  Result Value Ref Range   WBC 5.7 4.0 - 10.5 K/uL   RBC 4.30 3.87 - 5.11 MIL/uL   Hemoglobin 14.0 12.0 - 15.0 g/dL   HCT 43.2 36.0 - 46.0 %   MCV 100.5 (H) 78.0 - 100.0 fL   MCH 32.6 26.0 - 34.0 pg   MCHC 32.4 30.0 - 36.0 g/dL   RDW 12.0 11.5 - 15.5 %   Platelets 200 150 - 400 K/uL   Neutrophils Relative % 60 %   Neutro Abs 3.4 1.7 - 7.7 K/uL   Lymphocytes Relative 29 %   Lymphs Abs 1.6 0.7 - 4.0 K/uL   Monocytes Relative 9 %   Monocytes Absolute 0.5 0.1 - 1.0 K/uL   Eosinophils Relative 2 %   Eosinophils Absolute 0.1 0.0 - 0.7 K/uL   Basophils Relative 0 %   Basophils Absolute 0.0 0.0 - 0.1 K/uL  Troponin I     Status: Abnormal   Collection Time: 10/31/16  8:41 PM  Result Value Ref Range   Troponin I 0.05 (  HH) <0.03 ng/mL    Comment: CRITICAL RESULT CALLED TO, READ BACK BY AND VERIFIED WITH: NEAL,K AT 2128 ON [id number] BY CHERESNOWSKY,T   D-dimer, quantitative (not at [facility])     Status: Abnormal   Collection Time: 10/31/16  8:41 PM  Result Value Ref Range   D-Dimer, Quant 1.17 (H) 0.00 - 0.50 ug/mL-FEU    Comment: (NOTE) At the manufacturer cut-off of 0.50 ug/mL FEU, this assay has been documented to exclude PE with a sensitivity and negative predictive value of 97 to 99%.  At this time, this assay has not been approved by the FDA to exclude DVT/VTE. Results should be correlated with clinical presentation.   Troponin I     Status: Abnormal   Collection Time: 10/31/16 11:38 PM  Result Value Ref Range   Troponin I 0.14 (HH) <0.03 ng/mL    Comment: CRITICAL RESULT CALLED TO, READ BACK BY AND VERIFIED WITH: NEAL,K AT 0010 ON [id number] BY CHERESNOWSKY,T   Hemoglobin A1c     Status: Abnormal   Collection Time: 11/01/16  6:18 AM  Result Value Ref Range   Hgb A1c MFr  Bld 6.6 (H) 4.8 - 5.6 %    Comment: (NOTE)         Pre-diabetes: 5.7 - 6.4         Diabetes: >6.4         Glycemic control for adults with diabetes: <7.0    Mean Plasma Glucose 143 mg/dL    Comment: (NOTE) Performed At: [facility] [place], [place] [id number] Lindon Romp MD PO:[id number]   Lipid panel     Status: Abnormal   Collection Time: 11/01/16  6:18 AM  Result Value Ref Range   Cholesterol 241 (H) 0 - 200 mg/dL   Triglycerides 151 (H) <150 mg/dL   HDL 50 >40 mg/dL   Total CHOL/HDL Ratio 4.8 RATIO   VLDL 30 0 - 40 mg/dL   LDL Cholesterol 161 (H) 0 - 99 mg/dL    Comment:        Total Cholesterol/HDL:CHD Risk Coronary Heart Disease Risk Table                     Men   Women  1/2 Average Risk   3.4   3.3  Average Risk       5.0   4.4  2 X Average Risk   9.6   7.1  3 X Average Risk  23.4   11.0        Use the calculated Patient Ratio above and the CHD Risk Table to determine the patient's CHD Risk.        ATP III CLASSIFICATION (LDL):  <100     mg/dL   Optimal  100-129  mg/dL   Near or Above                    Optimal  130-159  mg/dL   Borderline  160-189  mg/dL   High  >190     mg/dL   Very High   Glucose, capillary     Status: Abnormal   Collection Time: 11/01/16  6:22 AM  Result Value Ref Range   Glucose-Capillary 134 (H) 65 - 99 mg/dL  Heparin level (unfractionated)     Status: None   Collection Time: 11/01/16  8:58 AM  Result Value Ref Range   Heparin Unfractionated 0.62 0.30 - 0.70 IU/mL    Comment:  IF HEPARIN RESULTS ARE BELOW EXPECTED VALUES, AND PATIENT DOSAGE HAS BEEN CONFIRMED, SUGGEST FOLLOW UP TESTING OF ANTITHROMBIN III LEVELS.   CBC     Status: None   Collection Time: 11/01/16  8:58 AM  Result Value Ref Range   WBC 5.0 4.0 - 10.5 K/uL   RBC 4.41 3.87 - 5.11 MIL/uL   Hemoglobin 14.0 12.0 - 15.0 g/dL   HCT 42.9 36.0 - 46.0 %   MCV 97.3 78.0 - 100.0 fL   MCH 31.7 26.0 - 34.0 pg   MCHC 32.6 30.0 - 36.0  g/dL   RDW 12.1 11.5 - 15.5 %   Platelets 194 150 - 400 K/uL  Glucose, capillary     Status: Abnormal   Collection Time: 11/01/16 11:59 AM  Result Value Ref Range   Glucose-Capillary 128 (H) 65 - 99 mg/dL   Comment 1 Notify RN    Comment 2 Document in Chart   Glucose, capillary     Status: Abnormal   Collection Time: 11/01/16  3:58 PM  Result Value Ref Range   Glucose-Capillary 139 (H) 65 - 99 mg/dL  Heparin level (unfractionated)     Status: None   Collection Time: 11/01/16  4:52 PM  Result Value Ref Range   Heparin Unfractionated 0.55 0.30 - 0.70 IU/mL    Comment:        IF HEPARIN RESULTS ARE BELOW EXPECTED VALUES, AND PATIENT DOSAGE HAS BEEN CONFIRMED, SUGGEST FOLLOW UP TESTING OF ANTITHROMBIN III LEVELS.   Glucose, capillary     Status: Abnormal   Collection Time: 11/01/16 10:19 PM  Result Value Ref Range   Glucose-Capillary 191 (H) 65 - 99 mg/dL   Comment 1 Notify RN    Comment 2 Document in Chart   CBC     Status: None   Collection Time: 11/02/16  3:35 AM  Result Value Ref Range   WBC 5.5 4.0 - 10.5 K/uL   RBC 4.05 3.87 - 5.11 MIL/uL   Hemoglobin 13.0 12.0 - 15.0 g/dL   HCT 39.7 36.0 - 46.0 %   MCV 98.0 78.0 - 100.0 fL   MCH 32.1 26.0 - 34.0 pg   MCHC 32.7 30.0 - 36.0 g/dL   RDW 12.3 11.5 - 15.5 %   Platelets 187 150 - 400 K/uL  Heparin level (unfractionated)     Status: None   Collection Time: 11/02/16  3:35 AM  Result Value Ref Range   Heparin Unfractionated 0.60 0.30 - 0.70 IU/mL    Comment:        IF HEPARIN RESULTS ARE BELOW EXPECTED VALUES, AND PATIENT DOSAGE HAS BEEN CONFIRMED, SUGGEST FOLLOW UP TESTING OF ANTITHROMBIN III LEVELS.   Glucose, capillary     Status: Abnormal   Collection Time: 11/02/16  6:53 AM  Result Value Ref Range   Glucose-Capillary 145 (H) 65 - 99 mg/dL   Comment 1 Notify RN     Imaging: Dg Chest 2 View  Result Date: 10/31/2016 CLINICAL DATA:  Epigastric and chest pain for 1 day. EXAM: CHEST  2 VIEW COMPARISON:   12/02/2013 FINDINGS: Mild linear scarring in the lateral left lung, unchanged. Right lung is clear. Pulmonary vasculature is normal. No pleural effusion. Unchanged mild cardiomegaly. Hilar and mediastinal contours are unremarkable and unchanged. IMPRESSION: Stable cardiomegaly. Stable linear scarring in the lateral left lung. No acute findings. Electronically Signed   By: Andreas Newport M.D.   On: 10/31/2016 21:04    Assessment:  1. Active Problems: 2.  Chest pain 3.   Non-STEMI (non-ST elevated myocardial infarction) ([place]) 4.   Dyspnea 5.   NSTEMI (non-ST elevated myocardial infarction) ([place]) 6.   Plan:  1. Unstable angina again today - relieved with nitrates. NSTEMI, troponin up to 0.14. Again discussed left heart catheterization and she is agreeable to this today. She discussed the procedure with Dr. Tamala Julian yesterday and feels confident with him doing it.   Time Spent Directly with Patient:  15 minutes  Length of Stay:  LOS: 1 day   Pixie Casino, MD, [facility] Attending Cardiologist [place] 11/02/2016, 10:12 AM

## 2016-11-02 NOTE — Progress Notes (Signed)
Pt complained of chest pressure and numbness in neck, face and tongue. Pulse 72 SR, BP 189/56, O2 sat 97% on RA.  Administered one sl ntg per prn orders for chest pain.

## 2016-11-02 NOTE — Progress Notes (Signed)
Pt refused all medications and insulin this pm stated she will not take anything until she speaks to her regular doctor.

## 2016-11-03 DIAGNOSIS — R748 Abnormal levels of other serum enzymes: Secondary | ICD-10-CM

## 2016-11-03 DIAGNOSIS — R778 Other specified abnormalities of plasma proteins: Secondary | ICD-10-CM

## 2016-11-03 DIAGNOSIS — R7989 Other specified abnormal findings of blood chemistry: Secondary | ICD-10-CM

## 2016-11-03 LAB — CBC
HEMATOCRIT: 38.9 % (ref 36.0–46.0)
Hemoglobin: 12.6 g/dL (ref 12.0–15.0)
MCH: 31.9 pg (ref 26.0–34.0)
MCHC: 32.4 g/dL (ref 30.0–36.0)
MCV: 98.5 fL (ref 78.0–100.0)
Platelets: 180 10*3/uL (ref 150–400)
RBC: 3.95 MIL/uL (ref 3.87–5.11)
RDW: 12.7 % (ref 11.5–15.5)
WBC: 6.1 10*3/uL (ref 4.0–10.5)

## 2016-11-03 LAB — GLUCOSE, CAPILLARY: Glucose-Capillary: 114 mg/dL — ABNORMAL HIGH (ref 65–99)

## 2016-11-03 MED ORDER — METOPROLOL TARTRATE 25 MG PO TABS: 12.5000 mg | ORAL_TABLET | Freq: Two times a day (BID) | ORAL | 6 refills | Status: DC

## 2016-11-03 MED ORDER — ATORVASTATIN CALCIUM 40 MG PO TABS: 40.0000 mg | ORAL_TABLET | Freq: Every day | ORAL | 6 refills | Status: DC

## 2016-11-03 MED ORDER — PANTOPRAZOLE SODIUM 40 MG PO TBEC: 40.0000 mg | DELAYED_RELEASE_TABLET | Freq: Every day | ORAL | 6 refills | Status: DC

## 2016-11-03 NOTE — Progress Notes (Signed)
Radial TR band removed.  Right hand sensation intact, pulse 2+.  Small amount of bruising noted.  POX 97% via thumb, pulse 74.  Recheck of BP 94/33.  Dressing applied.  Educated Pt on continuing to be mindful of site to prevent bleeding.  Pt indicates understanding.

## 2016-11-03 NOTE — Progress Notes (Signed)
Discharged to home with family office visits in place teaching done  

## 2016-11-03 NOTE — Discharge Summary (Addendum)
Discharge Summary    Patient ID: Jaime Mooney,  MRN: [id number], DOB/AGE: 81-Feb-1930 81 y.o.  Admit date: 10/31/2016 Discharge date: 11/03/2016  Primary Care Provider: [facility] Primary Cardiologist: Dr. Radford Pax  Discharge Diagnoses    Principal Problem:   NSTEMI (non-ST elevated myocardial infarction) [facility]) Active Problems:   Chest pain   Non-STEMI (non-ST elevated myocardial infarction) ([employer])   Dyspnea   Other forms of angina pectoris (HCC)   Elevated troponin   Allergies Allergies  Allergen Reactions  . Epinephrine     Causes heart palpatations  . Lisinopril     Facial swelling and tongue swelling  . Norvasc [Amlodipine Besylate] Swelling  . Prednisone     Facial swelling    Diagnostic Studies/Procedures    Echo 11/01/16 LV EF: 50% -   55%  ------------------------------------------------------------------- Indications:      MI - acute 410.91.  ------------------------------------------------------------------- History:   PMH:  PVCs. Chest pain.  Dyspnea.  Risk factors: Current tobacco use.  ------------------------------------------------------------------- Study Conclusions  - Left ventricle: The cavity size was normal. Wall thickness was   increased in a pattern of mild LVH. Systolic function was normal.   The estimated ejection fraction was in the range of 50% to 55%.   Wall motion was normal; there were no regional wall motion   abnormalities. Doppler parameters are consistent with abnormal   left ventricular relaxation (grade 1 diastolic dysfunction). - Aortic valve: There was trivial regurgitation.  Impressions:  - Normal LV systolic function; grade 1 diastolic dysfunction; trace   AI, MR.  Left Heart Cath and Coronary Angiography  11/02/16  Conclusion     Ost LAD to Prox LAD lesion, 40 %stenosed.  The left ventricular ejection fraction is 55-65% by visual estimate.  The left ventricular systolic function is normal.     Essentially normal coronary arteries.   LAD has ostial 30-40% narrowing.  Circumflex gives origin to a large first obtuse marginal and a distal obtuse marginal. There is significant tortuosity but no obstructive disease noted.  Dominant right coronary system. No significant obstructive disease is noted.  Normal LV function and normal EDP at 11 mmHg. EF 55%.  No explanation for the mild increase in cardiac markers.  RECOMMENDATIONS:   Recurring chest discomfort is not due to obstructive coronary disease.  Elevated markers may be due to demand ischemia or false positive elevation.    History of Present Illness     Jaime Mooney is a 83 y.o. female with past medical history of non-insulin dependent diabetes, gastroesophageal reflux disease, and remote history of smoking presents with complaints of chest pain.  Jaime Mooney states at around 4 pm 10/31/16 while at home, she first started noticing pain that went from her upper stomach to her neck.  The pain did go to both arms and shoulders.  She describes the pain as sharp, 10/10 in intensity, and was made better initially with tums taken at home.  Her first episode of pain lasted only 2 minutes.  She had another episode of pain at around 6:30 pm that was similar but the tums did not help relieve her symptoms.  Her second episode of pain lasted around 45 minutes and this time she had shortness of breath.  Patient eventually got her husband to take her to her local emergency department.  Patient is currently chest pain free at this time.  She does state that every time she walks now, her chest pain symptoms come back slightly.  Patient reports a similar episode about 2 weeks ago.    Of note, Jaime Mooney states that she is no longer taking any medications.  Patient stopped taking medications over the last 6 months due to having to take care of others and forgetting to continue her medications.  Also, patient states that she is suppose to  see a gastroenterologist to do scope for her reflux disease.    Saw Dr. Radford Pax in 2016, but vaguely remembers her. Apparently had a normal cath 5 years ago in [place].   Hospital Course     Consultants: None  Patient admitted and started on IV heparin and metoprolol. Echo shows low normal EF of 50-55% without obvious WMA's (was 60-65%).  Patient initially refused cath and preferred non-invasive testing however later agreed after recurrent chest pain that was responsive to nitrates. Peak of troponin 0.14. Cath showed non obstructive CAD with only ostial LAD 30-40%. Elevated markers may be due to demand ischemia or false positive elevation. Her chest pain somewhat improved after Maalox. She will f/u with PCP for GI work up that was recommended previously.   11/01/2016: Cholesterol 241; HDL 50; LDL Cholesterol 161; Triglycerides 151; VLDL 30 start Lipitor 40mg  qd.  A1c 6.6. She was on SSI here. F/u with PCP.   Started on BB due to elevated BP.   The patient has been seen by Dr. Debara Pickett today and deemed ready for discharge home. All follow-up appointments have been scheduled. Discharge medications are listed below.    Discharge Vitals Blood pressure (!) 117/33, pulse 70, temperature 98 F (36.7 C), temperature source Oral, resp. rate 18, height 5' (1.524 m), weight 139 lb 8 oz (63.3 kg), SpO2 94 %.  Filed Weights   10/31/16 1946 11/01/16 0255  Weight: 139 lb (63 kg) 139 lb 8 oz (63.3 kg)    Labs & Radiologic Studies     CBC  Recent Labs  10/31/16 2041  11/02/16 1837 11/03/16 0402  WBC 5.7  < > 5.8 6.1  NEUTROABS 3.4  --   --   --   HGB 14.0  < > 14.0 12.6  HCT 43.2  < > 42.4 38.9  MCV 100.5*  < > 97.0 98.5  PLT 200  < > 179 180  < > = values in this interval not displayed. Basic Metabolic Panel  Recent Labs  10/31/16 2041 11/02/16 1837  NA 141  --   K 3.9  --   CL 104  --   CO2 30  --   GLUCOSE 153*  --   BUN 23*  --   CREATININE 0.60 0.54  CALCIUM 9.3  --     Liver Function Tests  Recent Labs  10/31/16 2041  AST 18  ALT 17  ALKPHOS 63  BILITOT 0.3  PROT 7.5  ALBUMIN 3.8   No results for input(s): LIPASE, AMYLASE in the last 72 hours. Cardiac Enzymes  Recent Labs  10/31/16 2041 10/31/16 2338  TROPONINI 0.05* 0.14*   BNP Invalid input(s): POCBNP D-Dimer  Recent Labs  10/31/16 2041  DDIMER 1.17*   Hemoglobin A1C  Recent Labs  11/01/16 0618  HGBA1C 6.6*   Fasting Lipid Panel  Recent Labs  11/01/16 0618  CHOL 241*  HDL 50  LDLCALC 161*  TRIG 151*  CHOLHDL 4.8   Thyroid Function Tests No results for input(s): TSH, T4TOTAL, T3FREE, THYROIDAB in the last 72 hours.  Invalid input(s): FREET3  Dg Chest 2 View  Result Date: 10/31/2016 CLINICAL DATA:  Epigastric and chest pain for 1 day. EXAM: CHEST  2 VIEW COMPARISON:  12/02/2013 FINDINGS: Mild linear scarring in the lateral left lung, unchanged. Right lung is clear. Pulmonary vasculature is normal. No pleural effusion. Unchanged mild cardiomegaly. Hilar and mediastinal contours are unremarkable and unchanged. IMPRESSION: Stable cardiomegaly. Stable linear scarring in the lateral left lung. No acute findings. Electronically Signed   By: Andreas Newport M.D.   On: 10/31/2016 21:04    Disposition   Pt is being discharged home today in good condition.  Follow-up Plans & Appointments    Follow-up Information    COPLAND,JESSICA, MD. Schedule an appointment as soon as possible for a visit in 4 day(s).   Specialty:  Family Medicine Why:  for post hospital  Contact information: [address] [place] [address] [phone]          Discharge Instructions    Diet - low sodium heart healthy    Complete by:  As directed    Discharge instructions    Complete by:  As directed    No driving for 48 hours. No lifting over 5 lbs for 1 week. No sexual activity for 1 week. Keep procedure site clean & dry. If you notice increased pain, swelling,  bleeding or pus, call/return!  You may shower, but no soaking baths/hot tubs/pools for 1 week.   Increase activity slowly    Complete by:  As directed       Discharge Medications   Current Discharge Medication List    START taking these medications   Details  atorvastatin (LIPITOR) 40 MG tablet Take 1 tablet (40 mg total) by mouth daily at 6 PM. Qty: 30 tablet, Refills: 6    metoprolol tartrate (LOPRESSOR) 25 MG tablet Take 0.5 tablets (12.5 mg total) by mouth 2 (two) times daily. Qty: 60 tablet, Refills: 6    pantoprazole (PROTONIX) 40 MG tablet Take 1 tablet (40 mg total) by mouth daily. Qty: 30 tablet, Refills: 6         Aspirin prescribed at discharge?  NO High Intensity Statin Prescribed? (Lipitor 40-80mg  or Crestor 20-40mg ): Yes Beta Blocker Prescribed? Yes For EF 45% or less, Was ACEI/ARB Prescribed? N/A ADP Receptor Inhibitor Prescribed? (i.e. Plavix etc.-Includes Medically Managed Patients): N/A For EF <40%, Aldosterone Inhibitor Prescribed? N/A Was EF assessed during THIS hospitalization? N/A Was Cardiac Rehab II ordered? (Included Medically managed Patients): N/A   Outstanding Labs/Studies   Consider OP f/u labs 6-8 weeks given statin initiation this admission.  Duration of Discharge Encounter   Greater than 30 minutes including physician time.  Signed, Bhagat,Bhavinkumar PA-C 11/03/2016, 10:15 AM   Pt. Seen and examined. Agree with the Resident/NP/PA-C note as written. Exam benign, RRR, nl breath sounds, mild ecchymosis at the radial cath site, no hematoma. Chest pain improved with mylanta - suspect GERD. I spoke with her daughter about concerns with memory - I believe there is some early dementia. Daughter is concerned about the health of both parents - house remains unkempt, husband with medical problems. I have encouraged follow-up with PCP who hopefully can help arrange memory evaluation and assist with placement options.  Cardiac follow-up  PRN.  Pixie Casino, MD, [facility] Attending Cardiologist [place]

## 2016-11-05 ENCOUNTER — Encounter (HOSPITAL_COMMUNITY): Payer: Self-pay | Admitting: Interventional Cardiology

## 2016-11-05 ENCOUNTER — Telehealth: Payer: Self-pay | Admitting: Behavioral Health

## 2016-11-05 NOTE — Telephone Encounter (Signed)
Transition Care Management Follow-up Telephone Call  Admit date: 10/31/2016 Discharge date: 11/03/2016  Primary Care Provider: COPLAND,JESSICA   How have you been since you were released from the hospital? Patient stated, "not feeling so great, still having some occasional pressure in her chest from the acid reflux".   Do you understand why you were in the hospital? yes, patient voiced that she had very bad acid reflux; per patient, a stress test and cardiac catheterization was completed in the hospital & results were unremarkable; no heart damage present.   Do you understand the discharge instructions? yes   Where were you discharged to? Home   Items Reviewed:  Medications reviewed: yes  Allergies reviewed: yes  Dietary changes reviewed: yes, low-sodium/heart healthy diet  Referrals reviewed: yes, follow-up with PCP.   Functional Questionnaire:   Activities of Daily Living (ADLs):   She states they are independent in the following: ambulation, bathing and hygiene, feeding, continence, grooming, toileting and dressing States they require assistance with the following: None   Any transportation issues/concerns?: no   Any patient concerns? yes, still having some occasional pressure in her chest from the acid reflux   Confirmed importance and date/time of follow-up visits scheduled yes, 11/07/16 at 11:30 AM.  Provider Appointment booked with Dr. Lorelei Pont.  Confirmed with patient if condition begins to worsen call PCP or go to the ER.  Patient was given the office number and encouraged to call back with question or concerns.  : yes

## 2016-11-07 ENCOUNTER — Encounter: Payer: Self-pay | Admitting: Family Medicine

## 2016-11-07 ENCOUNTER — Telehealth: Payer: Self-pay | Admitting: Family Medicine

## 2016-11-07 ENCOUNTER — Ambulatory Visit (INDEPENDENT_AMBULATORY_CARE_PROVIDER_SITE_OTHER): Payer: Medicare Other | Admitting: Family Medicine

## 2016-11-07 VITALS — BP 141/58 | HR 62 | Temp 98.0°F | Ht 62.0 in | Wt 141.4 lb

## 2016-11-07 DIAGNOSIS — R079 Chest pain, unspecified: Secondary | ICD-10-CM | POA: Diagnosis not present

## 2016-11-07 DIAGNOSIS — E782 Mixed hyperlipidemia: Secondary | ICD-10-CM | POA: Diagnosis not present

## 2016-11-07 NOTE — Progress Notes (Addendum)
[place] at [facility] [address] [place], [place] [address] [phone]  Date:  11/07/2016   Name:  Jaime Mooney   DOB:  [birth year]   MRN:  [id number]  PCP:  Lamar Blinks, MD    Chief Complaint: Hospitalization Follow-up (Seen in ER 10/31/16. Pt states that she feels so worn out. Pt would like bruising from IV on right arm looked at. )   History of Present Illness:  Jaime Mooney is a 83 y.o. very pleasant female patient who presents with the following:  Here today for a hospital follow-up She was seen in the ER about one week ago,and was admitted with a NSTEMI from 1/31 to 2/3. Reviewed these records. She did have a cath that showed non- obstructive CAD, no intervention needed.  Recommendation from cath are:   Recurring chest discomfort is not due to obstructive coronary disease.  Elevated markers may be due to demand ischemia or false positive elevation. She was started on a BB- metoprolol, and also a statin Retal states that she was told she did not need to follow-up as her cath was negative. Per Dr. Lysbeth Penner discharge note cardiac follow-up is prn  She has noted some bruising in her right arm from her IV for the cath procedure She continues to have the chest pain which is better than it was when she presented to the hospital. She notes that she had some maalox in the hospital that helped with this pain. At home she has tried maalox for same and it does make her pain go away. She is seeing Carlean Purl on Monday   She is refusing to take lipitor and pantoprazole- unclear why she is refusing these meds, states that someone "in the know" told her never to take protonix.  She does plan to start back on her red yeast rice and fish oil supplement  Discussed how they are doing at home- Pennylane does state that they plan to get a maid, and they also plan to get meals on wheels.    She notes that she continues to feel tired and weak.    She has well controlled DM- died controlled  Lab Results  Component Value Date   HGBA1C 6.6 (H) 11/01/2016     Also got a call from her daughter Jaime Mooney as below- concern about Irmalee's memory  Called and spoke with Jaime Mooney She lives in [place]- her brother does live here in [place] but his wife is very ill so he is not able to help out much. She is getting concerned about her mom's memory, and about them living on their own.  They are in an apt at this time.  She has hired a Arboriculturist and is working on getting them meals on wheals.  She would like to look into assisted living with them if they are willing.  She would like for her mom to have a neurology eval regarding her memory.  I think this is a reasonable idea and will discuss it with Ailis- it is ok to say that I spoke with Jaime Mooney    Patient Active Problem List   Diagnosis Date Noted  . Elevated troponin   . Other forms of angina pectoris ([place])   . Chest pain 11/01/2016  . Non-STEMI (non-ST elevated myocardial infarction) ([place]) 11/01/2016  . Dyspnea 11/01/2016  . NSTEMI (non-ST elevated myocardial infarction) ([place]) 11/01/2016  . Type 2 diabetes mellitus without complication, with long-term current  use of insulin ([place]) 06/19/2016  . Abnormal EKG 11/15/2014  . PVC (premature ventricular contraction)   . Urinary incontinence 12/02/2013  . Left hip pain 12/02/2013  . Left shoulder pain 12/02/2013  . Scoliosis 12/02/2013  . Allergic rhinitis due to pollen 12/02/2013    Past Medical History:  Diagnosis Date  . Allergy   . Arthritis   . Diabetes mellitus type 2, uncomplicated ([place]) [id number]  . GERD (gastroesophageal reflux disease)   . PVC (premature ventricular contraction)     Past Surgical History:  Procedure Laterality Date  . BREAST SURGERY    . CARDIAC CATHETERIZATION N/A 11/02/2016   Procedure: Left Heart Cath and Coronary Angiography;  Surgeon: Belva Crome, MD;  Location: [facility];  Service:  Cardiovascular;  Laterality: N/A;  . CHOLECYSTECTOMY    . EYE SURGERY     cataracts bilateral   . FRACTURE SURGERY    . gallbladder surgery 5 years ago    . left breast surgery 20 yrs ago    . nose jfracture 16 yeras ago    . right breast surgery 2012      Social History  Substance Use Topics  . Smoking status: Former Research scientist (life sciences)  . Smokeless tobacco: Never Used  . Alcohol use Yes     Comment: 7 drinks per week    Family History  Problem Relation Age of Onset  . Cancer Mother   . Cancer Father   . Diabetes Father   . Heart disease Father   . Stroke Father   . Cancer Sister   . Heart disease Maternal Grandmother   . Heart disease Maternal Grandfather   . Heart disease Paternal Grandmother   . Diabetes Paternal Grandfather     Allergies  Allergen Reactions  . Epinephrine     Causes heart palpatations  . Lisinopril     Facial swelling and tongue swelling  . Norvasc [Amlodipine Besylate] Swelling  . Prednisone     Facial swelling    Medication list has been reviewed and updated.  Current Outpatient Prescriptions on File Prior to Visit  Medication Sig Dispense Refill  . atorvastatin (LIPITOR) 40 MG tablet Take 1 tablet (40 mg total) by mouth daily at 6 PM. 30 tablet 6  . metoprolol tartrate (LOPRESSOR) 25 MG tablet Take 0.5 tablets (12.5 mg total) by mouth 2 (two) times daily. 60 tablet 6  . pantoprazole (PROTONIX) 40 MG tablet Take 1 tablet (40 mg total) by mouth daily. 30 tablet 6   No current facility-administered medications on file prior to visit.     Review of Systems:  As per HPI- otherwise negative.   Physical Examination: Vitals:   11/07/16 1155  BP: (!) 141/58  Pulse: 62  Temp: 98 F (36.7 C)   Vitals:   11/07/16 1155  Weight: 141 lb 6.4 oz (64.1 kg)  Height: 5\' 2"  (1.575 m)   Body mass index is 25.86 kg/m. Ideal Body Weight: Weight in (lb) to have BMI = 25: 136.4  GEN: WDWN, NAD, Non-toxic, A & O x 3, well appearing elderly lady here with  her husband HEENT: Atraumatic, Normocephalic. Neck supple. No masses, No LAD. Ears and Nose: No external deformity. CV: RRR, No M/G/R. No JVD. No thrill. No extra heart sounds. PULM: CTA B, no wheezes, crackles, rhonchi. No retractions. No resp. distress. No accessory muscle use. ABD: S, NT, ND, +BS. No rebound. No HSM. EXTR: No c/c/e NEURO Normal gait.  PSYCH: Normally interactive. Conversant. Not  depressed or anxious appearing.  Calm demeanor.  Left wrist- mild bruising from recent cath. Normal ROM and strength of hand.  Reassured   BP Readings from Last 3 Encounters:  11/07/16 (!) 141/58  11/03/16 (!) 117/33  09/20/16 139/62    Assessment and Plan: Chest pain, unspecified type - Plan: CANCELED: Troponin I  Elevated triglycerides with high cholesterol  Here today to follow-up from recent hospital stay. Dx is NSTEMI- however her cath was ok, so this dx is somewhat unclear to me.  Will touch base with Dr. Debara Pickett about her exact dx and any needed follow-up She is refusing to take her statin- she understands that it may help her heart and prevent heart disease, but she is 27 and does not want to take it She also is refusing her PPI- encouraged her to take this but she will not. She wishes to discuss with Dr. Carlean Purl first.  It may be that her chest pain is actually GI in origin.  For the time being she will continue prn maalox.  Per history she does have a dx of esophageal spasm  Called pt on 2/8 to discuss her memory-  For the time being Sundai declines neurology evaluation.  She will let her daughter know that we did discuss this issue  Signed Lamar Blinks, MD  Heard back from Dr. Debara Pickett concerning questions of NSTEMI- his response as below Jess-   I would not call it an NSTEMI - she had troponin elevation with only mild CAD on cath. Troponin elevation could be artifactual or related to another process. Would continue to treat her medically for non-obstructive CAD as you are - I'm  happy to see her as needed for follow-up. Seem like GI was the culprit for her symptoms. Also, I was worried about memory loss - formal testing for a dementia would be a good idea - as you can see from my addendum to the d/c summary, I spoke with her daughter from [place] about this who has some concerns.   Thanks.

## 2016-11-07 NOTE — Telephone Encounter (Signed)
Called and spoke with Belenda Cruise She lives in [place]- her brother does live here in [place] but his wife is very ill so he is not able to help out much. She is getting concerned about her mom's memory, and about them living on their own.  They are in an apt at this time.  She has hired a Arboriculturist and is working on getting them meals on wheals.  She would like to look into assisted living with them if they are willing.  She would like for her mom to have a neurology eval regarding her memory.  I think this is a reasonable idea and will discuss it with Deshanta- it is ok to say that I spoke with Belenda Cruise

## 2016-11-07 NOTE — Progress Notes (Signed)
Pre visit review using our clinic review tool, if applicable. No additional management support is needed unless otherwise documented below in the visit note. 

## 2016-11-07 NOTE — Telephone Encounter (Signed)
Pt's daughter Caroleen Hamman called in with a concern that she would like to share with PCP. She said that she and her father feels that mom has on coming dementia because they have noticed that mom has become very forgetful. She would like to have mom tested if possible. She said that pt has an appt today, spouse will be with her. She said that if provider has any questions or want to speak with her her phone number is : [phone]

## 2016-11-07 NOTE — Patient Instructions (Addendum)
It was good to see you today Let me know if any change or worsening of your symptoms.   I will check and see if you are meant to have cardiology follow-up

## 2016-11-12 ENCOUNTER — Ambulatory Visit (INDEPENDENT_AMBULATORY_CARE_PROVIDER_SITE_OTHER): Payer: Medicare Other | Admitting: Internal Medicine

## 2016-11-12 ENCOUNTER — Encounter: Payer: Self-pay | Admitting: Internal Medicine

## 2016-11-12 VITALS — BP 144/60 | HR 60 | Ht 59.25 in | Wt 140.5 lb

## 2016-11-12 DIAGNOSIS — R490 Dysphonia: Secondary | ICD-10-CM

## 2016-11-12 DIAGNOSIS — R1310 Dysphagia, unspecified: Secondary | ICD-10-CM | POA: Diagnosis not present

## 2016-11-12 DIAGNOSIS — M419 Scoliosis, unspecified: Secondary | ICD-10-CM

## 2016-11-12 DIAGNOSIS — R12 Heartburn: Secondary | ICD-10-CM

## 2016-11-12 MED ORDER — ESOMEPRAZOLE MAGNESIUM 40 MG PO CPDR: 40.0000 mg | DELAYED_RELEASE_CAPSULE | Freq: Every day | ORAL | 2 refills | Status: DC

## 2016-11-12 NOTE — Progress Notes (Signed)
Jaime Mooney 83 y.o. Nov 01, 1928 [id number]  Assessment & Plan:   Encounter Diagnoses  Name Primary?  Marland Kitchen Dysphagia, unspecified type Yes  . Heartburn   . Chronic hoarseness   . Kyphoscoliosis     It's quite possible her scoliosis is contributing to this by distorting her anatomy. I don't see an obvious hiatal hernia though that is common in kyphoscoliosis. I have looked at her CT scan and chest x-ray to see if they're present. They were not mention in reports and I think that is correct. Her age and comorbidities limit our ability to do things with an endoscope, though she has a discrete stricture, I would most likely recommend an attempted esophageal dilation.  Evaluate w/ Ba swallow + tablet  Esomeprazole 40 mg daily  Suspect GERD and scoliosis issues  Await Ba swallow to determine f/u  Given recent cardiac issue an elective EGD - if needed will be deferred for a few mos most likelyThough the cardiac cath is very reassuring.  I appreciate the opportunity to care for this patient. CC: COPLAND,JESSICA, MD    Subjective:   Chief Complaint: Hoarseness and choking when swallowing  HPI The patient is a very nice early white woman with a long history of intermittent hoarseness, and also having intermittent problems with swallowing solid foods and sometimes water. She was reporting these issues to her primary care provider Dr. Lorelei Pont recently, and reviewing that note it looks like she was recommended to take a PPI, in particular pantoprazole that the patient declined because she was told it was dangerous. She has frequent regurgitation. Versus is been there for years. She feel scratchy in her upper throat upper chest area. More recently she had severe pain there, radiating up into the jaw area, and even into the back and she was admitted to the hospital. She had a positive troponin and a non-STEMI. Ejection fraction was good. Minimal if any obstructive coronary disease. He'll use  Tums with some relief of these symptoms. She has been afraid to use a PPI, mainly pantoprazole. She is use Nexium in the past but for some reason stop that and she is willing to take that. She does drink 2 or 3 caffeinated beverages a day but does not smoke.  Medications, allergies, past medical history, past surgical history, family history and social history are reviewed and updated in the EMR.  Review of Systems  He has chronic exertional dyspnea that seems relatively stable. Has a lot of nasal symptoms and some drainage and difficulty breathing through that after having had multiple surgeries for some from of there. She has back pain. Joint pains fatigue. She cannot hear normally or least as well as she used to. She also complains of sore throat at times. Her is some concern about memory disturbance. Ability to continue to live and her current living situation based upon what I see in the chart conversation with patient's daughter. All other review of systems are negative or as per history of present illness. Objective:   Physical Exam @BP  (!) 144/60 (BP Location: Left Arm, Patient Position: Sitting, Cuff Size: Normal)   Pulse 60   Ht 4' 11.25" (1.505 m) Comment: height measured without shoes  Wt 140 lb 8 oz (63.7 kg)   BMI 28.14 kg/m @  General:  Well-developed, well-nourished and in no acute distress Eyes:  anicteric. ENT:   Mouth and posterior pharynx free of lesions.  Neck:   supple w/o thyromegaly or mass.  Lungs: Clear to  auscultation bilaterally. Heart:  S1S2, no rubs, murmurs, gallops. Severe kyphoscoliosis - dextro Abdomen:  soft, non-tender, no hepatosplenomegaly, hernia, or mass and BS+.   Lymph:  no cervical or supraclavicular adenopathy. Extremities:   no edema, cyanosis or clubbing Skin   no rash. Neuro:  A&O x 3.  Psych:  appropriate mood and  Affect.   Data Reviewed:  Recent primary care notes, hospital records as per history of present illness labs in the EMR  imaging studies personally reviewed images CT scan and chest x-rays.

## 2016-11-12 NOTE — Progress Notes (Deleted)
   DOY GARA 83 y.o. Feb 24, 1929 [id number]  Assessment & Plan:      Subjective:   Chief Complaint:  HPI    Review of Systems   Objective:   Physical Exam @BP  (!) 144/60 (BP Location: Left Arm, Patient Position: Sitting, Cuff Size: Normal)   Pulse 60   Ht 4' 11.25" (1.505 m) Comment: height measured without shoes  Wt 140 lb 8 oz (63.7 kg)   BMI 28.14 kg/m @  General:  Well-developed, well-nourished and in no acute distress Eyes:  anicteric. ENT:   Mouth and posterior pharynx free of lesions.  Neck:   supple w/o thyromegaly or mass.  Lungs: Clear to auscultation bilaterally. Severe kyphoscoliosis - dextro Heart:  S1S2, no rubs, murmurs, gallops. Abdomen:  soft, non-tender, no hepatosplenomegaly, hernia, or mass and BS+.  Rectal: Lymph:  no cervical or supraclavicular adenopathy. Extremities:   no edema, cyanosis or clubbing Skin   no rash. Neuro:  A&O x 3.  Psych:  appropriate mood and  Affect.   Data Reviewed:

## 2016-11-12 NOTE — Patient Instructions (Signed)
  We have sent the following medications to your pharmacy for you to pick up at your convenience: Generic Nexium   You have been scheduled for a Barium Esophogram at [facility] on 11/16/16 at 9:00AM. Please arrive 15 minutes prior to your appointment for registration. Make certain not to have anything to eat or drink after midnight prior to your test. If you need to reschedule for any reason, please contact radiology at [phone] to do so. __________________________________________________________________ A barium swallow is an examination that concentrates on views of the esophagus. This tends to be a double contrast exam (barium and two liquids which, when combined, create a gas to distend the wall of the oesophagus) or single contrast (non-ionic iodine based). The study is usually tailored to your symptoms so a good history is essential. Attention is paid during the study to the form, structure and configuration of the esophagus, looking for functional disorders (such as aspiration, dysphagia, achalasia, motility and reflux) EXAMINATION You may be asked to change into a gown, depending on the type of swallow being performed. A radiologist and radiographer will perform the procedure. The radiologist will advise you of the type of contrast selected for your procedure and direct you during the exam. You will be asked to stand, sit or lie in several different positions and to hold a small amount of fluid in your mouth before being asked to swallow while the imaging is performed .In some instances you may be asked to swallow barium coated marshmallows to assess the motility of a solid food bolus. The exam can be recorded as a digital or video fluoroscopy procedure. POST PROCEDURE It will take 1-2 days for the barium to pass through your system. To facilitate this, it is important, unless otherwise directed, to increase your fluids for the next 24-48hrs and to resume your normal diet.  This  test typically takes about 30 minutes to perform. __________________________________________________________________________________   I appreciate the opportunity to care for you. Silvano Rusk, MD, [facility]

## 2016-11-13 ENCOUNTER — Telehealth: Payer: Self-pay

## 2016-11-13 NOTE — Telephone Encounter (Signed)
Left message to call me back , turns out they don't do Barium Swallows at [facility] so Holiday in scheduling has set it up at [facility] for 11/16/16 at 10AM, NPO midnight.  Peggy and I both have left her messages to inform her and we want her to call us back to make sure she got the message.

## 2016-11-13 NOTE — Telephone Encounter (Signed)
Patient called back to let me know that she is aware of the time/date/place of her Barium Swallow.

## 2016-11-16 ENCOUNTER — Ambulatory Visit (HOSPITAL_BASED_OUTPATIENT_CLINIC_OR_DEPARTMENT_OTHER): Payer: Medicare Other

## 2016-11-16 ENCOUNTER — Ambulatory Visit (HOSPITAL_COMMUNITY)
Admission: RE | Admit: 2016-11-16 | Discharge: 2016-11-16 | Disposition: A | Discharge status: Home / Self Care | Payer: Medicare Other | Source: Ambulatory Visit | Attending: Internal Medicine | Admitting: Internal Medicine

## 2016-11-16 DIAGNOSIS — K449 Diaphragmatic hernia without obstruction or gangrene: Secondary | ICD-10-CM | POA: Diagnosis not present

## 2016-11-16 DIAGNOSIS — K224 Dyskinesia of esophagus: Secondary | ICD-10-CM | POA: Insufficient documentation

## 2016-11-16 DIAGNOSIS — R1310 Dysphagia, unspecified: Secondary | ICD-10-CM | POA: Diagnosis not present

## 2016-11-16 NOTE — Progress Notes (Signed)
She has abnormal esophageal motility or spasms - this is causing swallowing problems and possibly the pain  1) Stay on esomeprazole 2) Modify diet - try Dysphagia 3 3) F/u me prn - no role for dilation  I have cced her PCP

## 2016-12-19 ENCOUNTER — Telehealth: Payer: Self-pay | Admitting: Family Medicine

## 2016-12-19 ENCOUNTER — Ambulatory Visit: Payer: Medicare Other | Admitting: Family Medicine

## 2016-12-19 NOTE — Telephone Encounter (Signed)
Pt lvm at 8:42 to cancel her appt due to weather.   Will call pt back to have her rescheduled.

## 2016-12-19 NOTE — Telephone Encounter (Signed)
Ok- no charge of course  

## 2016-12-26 ENCOUNTER — Ambulatory Visit (INDEPENDENT_AMBULATORY_CARE_PROVIDER_SITE_OTHER): Payer: Medicare Other | Admitting: Family Medicine

## 2016-12-26 VITALS — BP 140/60 | HR 63 | Temp 97.5°F | Ht 59.25 in | Wt 142.2 lb

## 2016-12-26 DIAGNOSIS — E2839 Other primary ovarian failure: Secondary | ICD-10-CM

## 2016-12-26 DIAGNOSIS — E785 Hyperlipidemia, unspecified: Secondary | ICD-10-CM

## 2016-12-26 DIAGNOSIS — R079 Chest pain, unspecified: Secondary | ICD-10-CM | POA: Diagnosis not present

## 2016-12-26 DIAGNOSIS — M4125 Other idiopathic scoliosis, thoracolumbar region: Secondary | ICD-10-CM | POA: Diagnosis not present

## 2016-12-26 NOTE — Patient Instructions (Addendum)
It was good to see you today!  Take care and please see me in about 3 months to check on how you are doing.  We will set up a bone density scan for you

## 2016-12-26 NOTE — Progress Notes (Signed)
[place] at [facility] [address], [place], [place] [address] [phone]  Date:  12/26/2016   Name:  Jaime Mooney   DOB:  [birth year]   MRN:  [id number]  PCP:  Lamar Blinks, MD    Chief Complaint: Follow-up   History of Present Illness:  Jaime Mooney is a 83 y.o. very pleasant female patient who presents with the following:  Here today for a follow-up visit Last seen by myself about 8 weeks ago on 11/07/16 for a chest pain hospital follow-up.  She had a questionable NSTEMI- touched base with her cardiologist who felt that her troponin was not elevated due to cardiac disease and that she did not actually have an MI; sx more likely due to GI   She would like a bone density test - last done "years ago" She notes that both of her ears "thump" at times She may have constipation alternating with diarrhea She has complaint of SOB which has been present for about 6 months.   This is related to activity.  She may have cough sometimes off an on.  No fever She also states that sometimes she will have episodes where she "burns from my head to my knees," these seem to be random and she is not sure what is causing them   She wants to stop using nexium "because I read what it did to you."   BP Readings from Last 3 Encounters:  12/26/16 140/60  11/12/16 (!) 144/60  11/07/16 (!) 141/58   Lab Results  Component Value Date   HGBA1C 6.6 (H) 11/01/2016     Patient Active Problem List   Diagnosis Date Noted  . Elevated troponin   . Other forms of angina pectoris ([place])   . Chest pain 11/01/2016  . Non-STEMI (non-ST elevated myocardial infarction) ([place]) 11/01/2016  . Dyspnea 11/01/2016  . NSTEMI (non-ST elevated myocardial infarction) ([place]) 11/01/2016  . Type 2 diabetes mellitus without complication, with long-term current use of insulin ([place]) 06/19/2016  . Abnormal EKG 11/15/2014  . PVC (premature ventricular contraction)   .  Urinary incontinence 12/02/2013  . Left hip pain 12/02/2013  . Left shoulder pain 12/02/2013  . Scoliosis 12/02/2013  . Allergic rhinitis due to pollen 12/02/2013    Past Medical History:  Diagnosis Date  . Allergy   . Arthritis   . Diabetes mellitus type 2, uncomplicated ([place]) 3/35/4562  . Gallstones 2010  . GERD (gastroesophageal reflux disease)   . HLD (hyperlipidemia)   . HTN (hypertension)   . NSTEMI (non-ST elevated myocardial infarction) ([place])   . PVC (premature ventricular contraction)     Past Surgical History:  Procedure Laterality Date  . BREAST SURGERY    . CARDIAC CATHETERIZATION N/A 11/02/2016   Procedure: Left Heart Cath and Coronary Angiography;  Surgeon: Belva Crome, MD;  Location: [facility];  Service: Cardiovascular;  Laterality: N/A;  . CHOLECYSTECTOMY    . EYE SURGERY     cataracts bilateral   . FRACTURE SURGERY    . gallbladder surgery 5 years ago    . left breast surgery 20 yrs ago    . nose fracture 16 yeras ago    . right breast surgery 2012      Social History  Substance Use Topics  . Smoking status: Former Research scientist (life sciences)  . Smokeless tobacco: Never Used  . Alcohol use Yes     Comment: occasional    Family  History  Problem Relation Age of Onset  . Esophageal cancer Mother     mets  . Diabetes Father   . Heart disease Father   . Stroke Father   . Prostate cancer Father   . Skin cancer Sister   . Heart disease Maternal Grandmother   . Heart disease Maternal Grandfather   . Heart disease Paternal Grandmother   . Diabetes Paternal Grandfather     Allergies  Allergen Reactions  . Acetaminophen Swelling  . Epinephrine     Causes heart palpatations  . Lisinopril     Facial swelling and tongue swelling  . Norvasc [Amlodipine Besylate] Swelling  . Prednisone Swelling    Facial swelling, can take in small doses    Medication list has been reviewed and updated.  Current Outpatient Prescriptions on File Prior to Visit  Medication  Sig Dispense Refill  . aspirin EC 81 MG tablet Take 81 mg by mouth daily.    Marland Kitchen esomeprazole (NEXIUM) 40 MG capsule Take 1 capsule (40 mg total) by mouth daily before breakfast. 30 capsule 2  . metoprolol tartrate (LOPRESSOR) 25 MG tablet Take 0.5 tablets (12.5 mg total) by mouth 2 (two) times daily. 60 tablet 6  . calcium elemental as carbonate (TUMS ULTRA 1000) 400 MG chewable tablet Chew 1,000 mg by mouth at bedtime.    . Cholecalciferol (VITAMIN D3) 2000 units TABS Take 1 tablet by mouth daily.    . Magnesium 250 MG TABS Take 1 tablet by mouth daily.    . Multiple Vitamins-Minerals (CENTRUM SILVER PO) Take 1 tablet by mouth daily.    . Omega-3 Fatty Acids (FISH OIL) 1000 MG CAPS Take 1 capsule by mouth daily.    Vladimir Faster Glycol-Propyl Glycol (SYSTANE OP) Apply to eye.    . Red Yeast Rice Extract (RED YEAST RICE PO) Take 2,400 mg by mouth daily.    . vitamin A 8000 UNIT capsule Take 8,000 Units by mouth daily.    . vitamin E 400 UNIT capsule Take 400 Units by mouth daily.     No current facility-administered medications on file prior to visit.     Review of Systems:  As per HPI- otherwise negative.   Physical Examination: Vitals:   12/26/16 1300 12/26/16 1326  BP: (!) 140/33 140/60  Pulse: 63   Temp: 97.5 F (36.4 C)    Vitals:   12/26/16 1300  Weight: 142 lb 3.2 oz (64.5 kg)  Height: 4' 11.25" (1.505 m)   Body mass index is 28.48 kg/m. Ideal Body Weight: Weight in (lb) to have BMI = 25: 124.6  GEN: WDWN, NAD, Non-toxic, A & O x 3, looks well She has a severe curve in her TL spine - this is stable HEENT: Atraumatic, Normocephalic. Neck supple. No masses, No LAD. Ears and Nose: No external deformity. CV: RRR, No M/G/R. No JVD. No thrill. No extra heart sounds. PULM: CTA B, no wheezes, crackles, rhonchi. No retractions. No resp. distress. No accessory muscle use. ABD: S, NT, ND EXTR: No c/c/e NEURO Normal gait.  PSYCH: Normally interactive. Conversant. Not depressed  or anxious appearing.  Calm demeanor.  Foot exam today- normal  Assessment and Plan: Estrogen deficiency - Plan: DG Bone Density  Chest pain, unspecified type  Other idiopathic scoliosis, thoracolumbar region  Dyslipidemia  Here today with a few concerns Her ears are normal on exam- she does not want to see ENT for further eval Also discussed her SOB- she does not wish to have  any further evaluation for this either, does not feel like it is severe enough to look at further at this time Discussed her medications- we would encourage her to use a statin as directed by her cardiologist but this is up to her discretion Also explained that she was given a PPI as her CP was thought due to GERD- however she can also certainly choose not to take this med if she prefers   She has an appt to see me in May and we can followup on her A1c at that time- she will let me know if any other concerns in the meantime   Signed Lamar Blinks, MD

## 2017-01-07 ENCOUNTER — Ambulatory Visit (HOSPITAL_BASED_OUTPATIENT_CLINIC_OR_DEPARTMENT_OTHER)
Admission: RE | Admit: 2017-01-07 | Discharge: 2017-01-07 | Disposition: A | Discharge status: Home / Self Care | Payer: Medicare Other | Source: Ambulatory Visit | Attending: Family Medicine | Admitting: Family Medicine

## 2017-01-07 DIAGNOSIS — Z13820 Encounter for screening for osteoporosis: Secondary | ICD-10-CM | POA: Diagnosis not present

## 2017-01-07 DIAGNOSIS — M85852 Other specified disorders of bone density and structure, left thigh: Secondary | ICD-10-CM | POA: Insufficient documentation

## 2017-01-07 DIAGNOSIS — E2839 Other primary ovarian failure: Secondary | ICD-10-CM | POA: Diagnosis not present

## 2017-01-07 DIAGNOSIS — M810 Age-related osteoporosis without current pathological fracture: Secondary | ICD-10-CM | POA: Diagnosis not present

## 2017-01-15 ENCOUNTER — Encounter: Payer: Self-pay | Admitting: Family Medicine

## 2017-01-28 ENCOUNTER — Ambulatory Visit (INDEPENDENT_AMBULATORY_CARE_PROVIDER_SITE_OTHER): Payer: Medicare Other | Admitting: Family Medicine

## 2017-01-28 VITALS — BP 118/70 | HR 78 | Temp 97.5°F | Ht 59.0 in | Wt 139.5 lb

## 2017-01-28 DIAGNOSIS — T887XXA Unspecified adverse effect of drug or medicament, initial encounter: Secondary | ICD-10-CM

## 2017-01-28 DIAGNOSIS — B372 Candidiasis of skin and nail: Secondary | ICD-10-CM | POA: Diagnosis not present

## 2017-01-28 DIAGNOSIS — M810 Age-related osteoporosis without current pathological fracture: Secondary | ICD-10-CM

## 2017-01-28 MED ORDER — FLUCONAZOLE 150 MG PO TABS: ORAL_TABLET | ORAL | 0 refills | Status: DC

## 2017-01-28 NOTE — Progress Notes (Signed)
Pre visit review using our clinic tool,if applicable. No additional management support is needed unless otherwise documented below in the visit note.  

## 2017-01-28 NOTE — Patient Instructions (Addendum)
I would recommend claritin or zyrtec for your allergies- generic is ok Please try taking a diflucan pill once a week for up to 8 weeks to try and clear up your rash. Let me know if not helpful!   Let's visit in 3 months

## 2017-01-28 NOTE — Progress Notes (Signed)
[place] at [facility] [address], [place], [place] [address] [phone]  Date:  01/28/2017   Name:  Jaime Mooney   DOB:  [birth year]   MRN:  [id number]  PCP:  Lamar Blinks, MD    Chief Complaint: Follow-up   History of Present Illness:  Jaime Mooney is a 83 y.o. very pleasant female patient who presents with the following:  Seen by myself abut a month ago- here today to discuss her dexa scan- I had sent her a letter about this test She has noted sx of sneezing recently  BP Readings from Last 3 Encounters:  01/28/17 118/70  12/26/16 140/60  11/12/16 (!) 144/60   She decided to stop her metoprolol about 10 days ago- it made her feel too tired and seemed to be causing a rash This was started back when she had a possible MI earlier this year.  However as it was determined that she did not have an MI after all I agree that she is ok to stop this medication if she prefers  dexa scan- done earlier this year.  She does have osteoporosis and I have recommended treatment for her.  Anetra understands this but she does not really feel that she wants treatment.  States that she has fallen several times and not had any fracture so she does not feel that medication is needed  She is also bothered by persistent rash under her breasts and in skin folds, it "can look like raw beef" and be painful.  She has tried diflucan in the past that helped for a while but then the sx came back.   Right now she is using home remedies such as listerine mouthwash which is actually helping some.    Patient Active Problem List   Diagnosis Date Noted  . Elevated troponin   . Other forms of angina pectoris ([place])   . Chest pain 11/01/2016  . Non-STEMI (non-ST elevated myocardial infarction) ([place]) 11/01/2016  . Dyspnea 11/01/2016  . Type 2 diabetes mellitus without complication, with long-term current use of insulin ([place]) 06/19/2016  . Abnormal EKG  11/15/2014  . PVC (premature ventricular contraction)   . Urinary incontinence 12/02/2013  . Left hip pain 12/02/2013  . Left shoulder pain 12/02/2013  . Scoliosis 12/02/2013  . Allergic rhinitis due to pollen 12/02/2013    Past Medical History:  Diagnosis Date  . Allergy   . Arthritis   . Diabetes mellitus type 2, uncomplicated ([place]) 8/00/3491  . Gallstones 2010  . GERD (gastroesophageal reflux disease)   . HLD (hyperlipidemia)   . HTN (hypertension)   . NSTEMI (non-ST elevated myocardial infarction) ([place])   . PVC (premature ventricular contraction)     Past Surgical History:  Procedure Laterality Date  . BREAST SURGERY    . CARDIAC CATHETERIZATION N/A 11/02/2016   Procedure: Left Heart Cath and Coronary Angiography;  Surgeon: Belva Crome, MD;  Location: [facility];  Service: Cardiovascular;  Laterality: N/A;  . CHOLECYSTECTOMY    . EYE SURGERY     cataracts bilateral   . FRACTURE SURGERY    . gallbladder surgery 5 years ago    . left breast surgery 20 yrs ago    . nose fracture 16 yeras ago    . right breast surgery 2012      Social History  Substance Use Topics  . Smoking status: Former Research scientist (life sciences)  . Smokeless tobacco: Never Used  .  Alcohol use Yes     Comment: occasional    Family History  Problem Relation Age of Onset  . Esophageal cancer Mother     mets  . Diabetes Father   . Heart disease Father   . Stroke Father   . Prostate cancer Father   . Skin cancer Sister   . Heart disease Maternal Grandmother   . Heart disease Maternal Grandfather   . Heart disease Paternal Grandmother   . Diabetes Paternal Grandfather     Allergies  Allergen Reactions  . Acetaminophen Swelling  . Epinephrine     Causes heart palpatations  . Lisinopril     Facial swelling and tongue swelling  . Norvasc [Amlodipine Besylate] Swelling  . Prednisone Swelling    Facial swelling, can take in small doses    Medication list has been reviewed and updated.  Current  Outpatient Prescriptions on File Prior to Visit  Medication Sig Dispense Refill  . aspirin EC 81 MG tablet Take 81 mg by mouth daily.    . calcium elemental as carbonate (TUMS ULTRA 1000) 400 MG chewable tablet Chew 1,000 mg by mouth at bedtime.    . Cholecalciferol (VITAMIN D3) 2000 units TABS Take 1 tablet by mouth daily.    . Magnesium 250 MG TABS Take 1 tablet by mouth daily.    . Multiple Vitamins-Minerals (CENTRUM SILVER PO) Take 1 tablet by mouth daily.    . Omega-3 Fatty Acids (FISH OIL) 1000 MG CAPS Take 1 capsule by mouth daily.    Vladimir Faster Glycol-Propyl Glycol (SYSTANE OP) Apply to eye.    . Red Yeast Rice Extract (RED YEAST RICE PO) Take 2,400 mg by mouth daily.    . vitamin A 8000 UNIT capsule Take 8,000 Units by mouth daily.    . vitamin E 400 UNIT capsule Take 400 Units by mouth daily.     No current facility-administered medications on file prior to visit.     Review of Systems:  As per HPI- otherwise negative.   Physical Examination: Vitals:   01/28/17 1102  BP: 118/70  Pulse: 78  Temp: 97.5 F (36.4 C)   Vitals:   01/28/17 1102  Weight: 139 lb 8 oz (63.3 kg)  Height: 4\' 11"  (1.499 m)   Body mass index is 28.18 kg/m. Ideal Body Weight: Weight in (lb) to have BMI = 25: 123.5  GEN: WDWN, NAD, Non-toxic, A & O x 3, elderly lady who looks well HEENT: Atraumatic, Normocephalic. Neck supple. No masses, No LAD. Ears and Nose: No external deformity. CV: RRR, No M/G/R. No JVD. No thrill. No extra heart sounds. PULM: CTA B, no wheezes, crackles, rhonchi. No retractions. No resp. distress. No accessory muscle use. She has mild candida intertrigo under her breasts . EXTR: No c/c/e NEURO Normal gait.  PSYCH: Normally interactive. Conversant. Not depressed or anxious appearing.  Calm demeanor.    Assessment and Plan: Candidal intertrigo - Plan: fluconazole (DIFLUCAN) 150 MG tablet  Medication side effect  Age-related osteoporosis without current  pathological fracture  Here today to discuss a few concerns Will have her try diflucan once a week for her candida- she will let me know if not helpful Ok to stop BB at this time She does not wish to be treated for osteoporosis which is a reasonable choice at her age.  Plan to repeat dexa in one year   Signed Lamar Blinks, MD

## 2017-02-04 ENCOUNTER — Ambulatory Visit: Payer: Medicare Other | Admitting: Family Medicine

## 2017-03-27 ENCOUNTER — Telehealth: Payer: Self-pay | Admitting: Family Medicine

## 2017-03-27 NOTE — Telephone Encounter (Signed)
Daughter Jaime Mooney needs to know if pt needs an appt. Pt does not want to come be seen but daughter thinks pt needs to be seen. Call daughter she will let you know symptoms, per her req, ph [phone]. cb Blood sugar 164 today. Please advise.

## 2017-03-27 NOTE — Telephone Encounter (Signed)
Called patients daughter. States she feels patient needs to be brought in for Diabetic evaluation. Daughter states patient and husband will be moving to an ALF and patient is not handling it well. States she will be in town next week and will talk to patient about coming in to have diabetic evaluation. States patient has been tremoulous lately. Advised daughter to call office if she feels patient needs to e seen before next week. No other symptoms noted at this time.

## 2017-03-27 NOTE — Telephone Encounter (Addendum)
Pt daughter brought app for independent living admission. Fax forms once complete and call daughter with questiions. Placed forms in front office tray.

## 2017-04-01 ENCOUNTER — Ambulatory Visit: Payer: Medicare Other | Admitting: Family Medicine

## 2017-04-01 ENCOUNTER — Ambulatory Visit (INDEPENDENT_AMBULATORY_CARE_PROVIDER_SITE_OTHER): Payer: Medicare Other | Admitting: Family Medicine

## 2017-04-01 VITALS — BP 102/72 | HR 77 | Temp 97.9°F | Ht 59.0 in | Wt 141.8 lb

## 2017-04-01 DIAGNOSIS — R7303 Prediabetes: Secondary | ICD-10-CM

## 2017-04-01 DIAGNOSIS — R200 Anesthesia of skin: Secondary | ICD-10-CM

## 2017-04-01 DIAGNOSIS — R251 Tremor, unspecified: Secondary | ICD-10-CM | POA: Diagnosis not present

## 2017-04-01 LAB — COMPREHENSIVE METABOLIC PANEL
ALT: 17 U/L (ref 0–35)
AST: 18 U/L (ref 0–37)
Albumin: 3.9 g/dL (ref 3.5–5.2)
Alkaline Phosphatase: 55 U/L (ref 39–117)
BUN: 20 mg/dL (ref 6–23)
CHLORIDE: 105 meq/L (ref 96–112)
CO2: 29 meq/L (ref 19–32)
Calcium: 9.5 mg/dL (ref 8.4–10.5)
Creatinine, Ser: 0.58 mg/dL (ref 0.40–1.20)
GFR: 104.64 mL/min (ref >60.00)
GLUCOSE: 140 mg/dL — AB (ref 70–99)
POTASSIUM: 4.2 meq/L (ref 3.5–5.1)
SODIUM: 140 meq/L (ref 135–145)
TOTAL PROTEIN: 7.2 g/dL (ref 6.0–8.3)
Total Bilirubin: 0.5 mg/dL (ref 0.2–1.2)

## 2017-04-01 LAB — CBC
HEMATOCRIT: 41.1 % (ref 36.0–46.0)
HEMOGLOBIN: 13.9 g/dL (ref 12.0–15.0)
MCHC: 33.8 g/dL (ref 30.0–36.0)
MCV: 97.1 fl (ref 78.0–100.0)
Platelets: 211 10*3/uL (ref 150.0–400.0)
RBC: 4.23 Mil/uL (ref 3.87–5.11)
RDW: 12.8 % (ref 11.5–15.5)
WBC: 5.2 10*3/uL (ref 4.0–10.5)

## 2017-04-01 LAB — HEMOGLOBIN A1C: Hgb A1c MFr Bld: 6.8 % — ABNORMAL HIGH (ref 4.6–6.5)

## 2017-04-01 NOTE — Telephone Encounter (Signed)
Done, will give to sharon today 

## 2017-04-01 NOTE — Patient Instructions (Signed)
It was good to see you today- I will have your labs back asap and will be in touch with your results.  We will also set you up for an ultrasound of your neck and MRI of your head to look for any sign of a stroke.  Let me know if you have any change or worsening of your symptoms in the meantime.  Continue to take your aspirin daily

## 2017-04-01 NOTE — Progress Notes (Addendum)
[place] at [facility] [address], [place], [place] [address] [phone]  Date:  04/01/2017   Name:  Jaime Mooney   DOB:  1929-01-14   MRN:  [id number]  PCP:  Darreld Mclean, MD    Chief Complaint: Tremors (c/o tremors that started last week. )   History of Present Illness:  Jaime Mooney is a 83 y.o. very pleasant female patient who presents with the following:  Pt states that her husband Jaime [place]oney had to be in the [place] not long ago.  Her daughter was also staying with them which can be quite hard for Jaime Mooney. Pt was under a lot of stress around that time, and noted some shakiness and tremors.  She describes having one or more episodes of feeling dizzy, shaky and panicky for about 2 days in a row, gradually improving. She also describes difficulty speaking and a sensation or pressure in her head when she would have the episodes.  This has not occurred in about 5 days.  Jaime Mooney feels that her sx were due to stress.   However she also states that the right side of her face seemed to become partially numb during the first episode that she had, and this has still not resolved  Their daughter had been with them for about 10 days to help them get started with a move to [place] [employer].  However she is now back in [place] for a few days with her nuclear family.   Pt states that she feels very tired and her head has "felt numb" on the right side.  No difficulty swallowing or facial drooping.  She has never had anything like this in the past    Pt also mentions that there is black mold in their home- they are not sure if this might have to do with her symptoms   Lab Results  Component Value Date   HGBA1C 6.6 (H) 11/01/2016   History of diet controlled DM with A1c of 6.6 for the last 12 months  Pt does not seem to remember that she has been dx with DM.    She and Jaime Mooney are on board with moving to [place]- however they are both  feeling overwhelmed by the process of moving.  Neither of them are driving right now so they are relying on friends and family for transportation.   Jaime Mooney also has a long history of chest pressure- she was admitted back in February with presumed NSTEMI- however her cath was negative so we think she did not have an MI after all  She also had a virtually normal echo at that time  - Normal LV systolic function; grade 1 diastolic dysfunction; trace   AI, MR  She is taking aspirin 81 daily Patient Active Problem List   Diagnosis Date Noted  . Elevated troponin   . Other forms of angina pectoris ([place])   . Chest pain 11/01/2016  . Non-STEMI (non-ST elevated myocardial infarction) ([place]) 11/01/2016  . Dyspnea 11/01/2016  . Type 2 diabetes mellitus without complication, with long-term current use of insulin ([place]) 06/19/2016  . Abnormal EKG 11/15/2014  . PVC (premature ventricular contraction)   . Urinary incontinence 12/02/2013  . Left hip pain 12/02/2013  . Left shoulder pain 12/02/2013  . Scoliosis 12/02/2013  . Allergic rhinitis due to pollen 12/02/2013    Past Medical History:  Diagnosis Date  . Allergy   . Arthritis   .  Diabetes mellitus type 2, uncomplicated ([place]) 8/41/3244  . Gallstones 2010  . GERD (gastroesophageal reflux disease)   . HLD (hyperlipidemia)   . HTN (hypertension)   . NSTEMI (non-ST elevated myocardial infarction) ([place])   . PVC (premature ventricular contraction)     Past Surgical History:  Procedure Laterality Date  . BREAST SURGERY    . CARDIAC CATHETERIZATION N/A 11/02/2016   Procedure: Left Heart Cath and Coronary Angiography;  Surgeon: Belva Crome, MD;  Location: [place] [facility];  Service: Cardiovascular;  Laterality: N/A;  . CHOLECYSTECTOMY    . EYE SURGERY     cataracts bilateral   . FRACTURE SURGERY    . gallbladder surgery 5 years ago    . left breast surgery 20 yrs ago    . nose fracture 16 yeras ago    . right breast surgery 2012       Social History  Substance Use Topics  . Smoking status: Former Research scientist (life sciences)  . Smokeless tobacco: Never Used  . Alcohol use Yes     Comment: occasional    Family History  Problem Relation Age of Onset  . Esophageal cancer Mother        mets  . Diabetes Father   . Heart disease Father   . Stroke Father   . Prostate cancer Father   . Skin cancer Sister   . Heart disease Maternal Grandmother   . Heart disease Maternal Grandfather   . Heart disease Paternal Grandmother   . Diabetes Paternal Grandfather     Allergies  Allergen Reactions  . Acetaminophen Swelling  . Epinephrine     Causes heart palpatations  . Lisinopril     Facial swelling and tongue swelling  . Norvasc [Amlodipine Besylate] Swelling  . Prednisone Swelling    Facial swelling, can take in small doses    Medication list has been reviewed and updated.  Current Outpatient Prescriptions on File Prior to Visit  Medication Sig Dispense Refill  . aspirin EC 81 MG tablet Take 81 mg by mouth daily.    . calcium elemental as carbonate (TUMS ULTRA 1000) 400 MG chewable tablet Chew 1,000 mg by mouth at bedtime.    . Cholecalciferol (VITAMIN D3) 2000 units TABS Take 1 tablet by mouth daily.    . Magnesium 250 MG TABS Take 1 tablet by mouth daily.    . Multiple Vitamins-Minerals (CENTRUM SILVER PO) Take 1 tablet by mouth daily.    . Omega-3 Fatty Acids (FISH OIL) 1000 MG CAPS Take 1 capsule by mouth daily.    Vladimir Faster Glycol-Propyl Glycol (SYSTANE OP) Apply to eye.    . vitamin A 8000 UNIT capsule Take 8,000 Units by mouth daily.    . vitamin E 400 UNIT capsule Take 400 Units by mouth daily.     No current facility-administered medications on file prior to visit.     Review of Systems:  As per HPI- otherwise negative. Pt has noted leg cramps at night- these are now better No fever She may have an occasional cough  Physical Examination: Vitals:   04/01/17 1001  BP: 102/72  Pulse: 77  Temp: 97.9 F (36.6  C)   Vitals:   04/01/17 1001  Weight: 141 lb 12.8 oz (64.3 kg)  Height: 4\' 11"  (1.499 m)   Body mass index is 28.64 kg/m. Ideal Body Weight: Weight in (lb) to have BMI = 25: 123.5  GEN: WDWN, NAD, Non-toxic, A & O x 3, looks well and  her normal self today Here with her husband Jaime [place]oney HEENT: Atraumatic, Normocephalic. Neck supple. No masses, No LAD. Ears and Nose: No external deformity. CV: RRR, No M/G/R. No JVD. No thrill. No extra heart sounds. PULM: CTA B, no wheezes, crackles, rhonchi. No retractions. No resp. distress. No accessory muscle use. ABD: S, NT, ND, +BS. No rebound. No HSM. EXTR: No c/c/e NEURO Normal gait.  Negative romberg.  Normal strength, sensation and DTR of all limbs today.  Normal facial movement, no drooping of facial muscles PSYCH: Normally interactive. Conversant. Not depressed or anxious appearing.  Calm demeanor.  Jaime Mooney seems a bit more flustered and confused today than usual.  May be due to stress vs mild age associated dementia  Lab Results  Component Value Date   HGBA1C 6.6 (H) 11/01/2016   Assessment and Plan: Facial numbness - Plan: MR Brain Wo Contrast, US Carotid Duplex Bilateral, CANCELED: US Carotid Duplex Bilateral  Pre-diabetes - Plan: Comprehensive metabolic panel, Hemoglobin A1c  Tremor - Plan: CBC, Comprehensive metabolic panel  Here today with concern of recnet episodes of panic vs possible TIA.  Will obtain basic labs for her today and follow-up on her mild DM At this time she does not have any significant neurologic sx except for a feeling of decreased sensation in her right face.   Will obtain carotid US and also MRI of her head.  This will have to be done on 7/5 as that is the only time they can get a ride.  Pt does not feel that she can use uber or a cab.   She will alert me if any change in her sx  For the time being will have her continue Asprin may change if a TIA or stroke is confirmed on MRI Asked her to please seek care if any  change or worsening of her sx At this time feel that transferring pt to ER or [place] for further wu today would be overly difficult and traumatizing, and pt is depending on a friend to take her home from clinic today. She wishes to go home and complete work up as outpt.   Signed Lamar Blinks, MD  Received her labs and gave her a call 7/3- no answer.  Will try her back  Results for orders placed or performed in visit on 04/01/17  CBC  Result Value Ref Range   WBC 5.2 4.0 - 10.5 K/uL   RBC 4.23 3.87 - 5.11 Mil/uL   Platelets 211.0 150.0 - 400.0 K/uL   Hemoglobin 13.9 12.0 - 15.0 g/dL   HCT 41.1 36.0 - 46.0 %   MCV 97.1 78.0 - 100.0 fl   MCHC 33.8 30.0 - 36.0 g/dL   RDW 12.8 11.5 - 15.5 %  Comprehensive metabolic panel  Result Value Ref Range   Sodium 140 135 - 145 mEq/L   Potassium 4.2 3.5 - 5.1 mEq/L   Chloride 105 96 - 112 mEq/L   CO2 29 19 - 32 mEq/L   Glucose, Bld 140 (H) 70 - 99 mg/dL   BUN 20 6 - 23 mg/dL   Creatinine, Ser 0.58 0.40 - 1.20 mg/dL   Total Bilirubin 0.5 0.2 - 1.2 mg/dL   Alkaline Phosphatase 55 39 - 117 U/L   AST 18 0 - 37 U/L   ALT 17 0 - 35 U/L   Total Protein 7.2 6.0 - 8.3 g/dL   Albumin 3.9 3.5 - 5.2 g/dL   Calcium 9.5 8.4 - 10.5 mg/dL   GFR 104.64 >  60.00 mL/min  Hemoglobin A1c  Result Value Ref Range   Hgb A1c MFr Bld 6.8 (H) 4.6 - 6.5 %   Called pt to check on her on 7/3- she reports feeing much better and that her face is no longer numb.  She is set up for her MRI and carotid US later this week

## 2017-04-01 NOTE — Telephone Encounter (Signed)
Placed notes on paperwork for clarification [especially if patient needs face-to-face appt]; completed provider's information on paperwork and forwarded to provider/SLS 07/02  

## 2017-04-02 ENCOUNTER — Encounter: Payer: Self-pay | Admitting: Family Medicine

## 2017-04-05 ENCOUNTER — Ambulatory Visit (HOSPITAL_BASED_OUTPATIENT_CLINIC_OR_DEPARTMENT_OTHER)
Admission: RE | Admit: 2017-04-05 | Discharge: 2017-04-05 | Disposition: A | Discharge status: Home / Self Care | Payer: Medicare Other | Source: Ambulatory Visit | Attending: Family Medicine | Admitting: Family Medicine

## 2017-04-05 DIAGNOSIS — R42 Dizziness and giddiness: Secondary | ICD-10-CM | POA: Diagnosis not present

## 2017-04-05 DIAGNOSIS — R200 Anesthesia of skin: Secondary | ICD-10-CM | POA: Insufficient documentation

## 2017-04-06 ENCOUNTER — Ambulatory Visit (HOSPITAL_BASED_OUTPATIENT_CLINIC_OR_DEPARTMENT_OTHER)
Admission: RE | Admit: 2017-04-06 | Discharge: 2017-04-06 | Disposition: A | Discharge status: Home / Self Care | Payer: Medicare Other | Source: Ambulatory Visit | Attending: Family Medicine | Admitting: Family Medicine

## 2017-04-06 DIAGNOSIS — R200 Anesthesia of skin: Secondary | ICD-10-CM

## 2017-04-07 ENCOUNTER — Telehealth: Payer: Self-pay | Admitting: Family Medicine

## 2017-04-07 NOTE — Telephone Encounter (Signed)
Called to go over her recent imaging studies;  Mr Brain Wo Contrast  Result Date: 04/06/2017 CLINICAL DATA:  Facial numbness, side not specified.  Possible TIA EXAM: MRI HEAD WITHOUT CONTRAST TECHNIQUE: Multiplanar, multiecho pulse sequences of the brain and surrounding structures were obtained without intravenous contrast. COMPARISON:  None. FINDINGS: Brain: No acute infarction, hemorrhage, hydrocephalus, extra-axial collection or mass lesion. Normal brain volume for age. Mild for age periventricular predominant chronic microvascular ischemic change. No findings along the trigeminal nerves to explain facial numbness. Vascular: Major flow voids are preserved Skull and upper cervical spine: Negative Sinuses/Orbits: Bilateral cataract resection.  No acute finding. IMPRESSION: Senescent changes without acute or reversible finding. No specific explanation for facial numbness. Electronically Signed   By: Monte Fantasia M.D.   On: 04/06/2017 11:12   US Carotid Duplex Bilateral  Result Date: 04/05/2017 CLINICAL DATA:  Facial numbness and dizziness.  Trouble speaking. EXAM: BILATERAL CAROTID DUPLEX ULTRASOUND TECHNIQUE: Pearline Cables scale imaging, color Doppler and duplex ultrasound were performed of bilateral carotid and vertebral arteries in the neck. COMPARISON:  None. FINDINGS: Criteria: Quantification of carotid stenosis is based on velocity parameters that correlate the residual internal carotid diameter with NASCET-based stenosis levels, using the diameter of the distal internal carotid lumen as the denominator for stenosis measurement. The following velocity measurements were obtained: RIGHT ICA:  62 cm/sec CCA:  80 cm/sec SYSTOLIC ICA/CCA RATIO:  1.1 DIASTOLIC ICA/CCA RATIO:  1.5 ECA:  70 cm/sec LEFT ICA:  64 cm/sec CCA:  90 cm/sec SYSTOLIC ICA/CCA RATIO:  0.9 DIASTOLIC ICA/CCA RATIO:  1.2 ECA:  75 cm/sec RIGHT CAROTID ARTERY: Right carotid arteries are patent without significant plaque or stenosis. External carotid  artery is patent with normal waveform. Normal waveforms and velocities in the internal carotid artery. RIGHT VERTEBRAL ARTERY: Antegrade flow and normal waveform in the right vertebral artery. LEFT CAROTID ARTERY: Left carotid arteries are patent without significant plaque or stenosis. External carotid artery is patent with normal waveform. Normal waveforms and velocities in the internal carotid artery. LEFT VERTEBRAL ARTERY: Antegrade flow and normal waveform in the left vertebral artery. IMPRESSION: No significant plaque or stenosis in the carotid arteries. Patent vertebral arteries with antegrade flow. Electronically Signed   By: Markus Daft M.D.   On: 04/05/2017 16:19   Called her- MRI and carotid US look fine.  She continues to NOT have any further left sided facial numbness.  Age related changes of the brain only

## 2017-04-19 ENCOUNTER — Telehealth: Payer: Self-pay | Admitting: *Deleted

## 2017-04-19 NOTE — Telephone Encounter (Signed)
Received Physician Orders from [facility] for [place], forwarded to provider/SLS 07/20

## 2017-04-25 ENCOUNTER — Telehealth: Payer: Self-pay | Admitting: *Deleted

## 2017-04-25 NOTE — Telephone Encounter (Signed)
Received Physician Orders from [place], forwarded to covering provider/SLS 07/26

## 2017-05-10 DIAGNOSIS — R41841 Cognitive communication deficit: Secondary | ICD-10-CM | POA: Diagnosis not present

## 2017-05-15 DIAGNOSIS — R41841 Cognitive communication deficit: Secondary | ICD-10-CM | POA: Diagnosis not present

## 2017-05-17 DIAGNOSIS — R41841 Cognitive communication deficit: Secondary | ICD-10-CM | POA: Diagnosis not present

## 2017-05-20 DIAGNOSIS — R41841 Cognitive communication deficit: Secondary | ICD-10-CM | POA: Diagnosis not present

## 2017-05-22 DIAGNOSIS — R41841 Cognitive communication deficit: Secondary | ICD-10-CM | POA: Diagnosis not present

## 2017-05-24 ENCOUNTER — Telehealth: Payer: Self-pay

## 2017-05-24 DIAGNOSIS — R41841 Cognitive communication deficit: Secondary | ICD-10-CM | POA: Diagnosis not present

## 2017-05-24 NOTE — Telephone Encounter (Signed)
Received ST Plan of Care from [facility], form placed in PCP red folder for completion.

## 2017-05-27 DIAGNOSIS — R41841 Cognitive communication deficit: Secondary | ICD-10-CM | POA: Diagnosis not present

## 2017-05-29 DIAGNOSIS — R41841 Cognitive communication deficit: Secondary | ICD-10-CM | POA: Diagnosis not present

## 2017-05-30 DIAGNOSIS — R41841 Cognitive communication deficit: Secondary | ICD-10-CM | POA: Diagnosis not present

## 2017-06-05 DIAGNOSIS — R41841 Cognitive communication deficit: Secondary | ICD-10-CM | POA: Diagnosis not present

## 2017-06-07 DIAGNOSIS — R41841 Cognitive communication deficit: Secondary | ICD-10-CM | POA: Diagnosis not present

## 2017-06-10 DIAGNOSIS — H04123 Dry eye syndrome of bilateral lacrimal glands: Secondary | ICD-10-CM | POA: Diagnosis not present

## 2017-06-10 DIAGNOSIS — Z961 Presence of intraocular lens: Secondary | ICD-10-CM | POA: Diagnosis not present

## 2017-06-10 DIAGNOSIS — H52203 Unspecified astigmatism, bilateral: Secondary | ICD-10-CM | POA: Diagnosis not present

## 2017-06-10 DIAGNOSIS — D2311 Other benign neoplasm of skin of right eyelid, including canthus: Secondary | ICD-10-CM | POA: Diagnosis not present

## 2017-06-11 DIAGNOSIS — R41841 Cognitive communication deficit: Secondary | ICD-10-CM | POA: Diagnosis not present

## 2017-06-21 DIAGNOSIS — R41841 Cognitive communication deficit: Secondary | ICD-10-CM | POA: Diagnosis not present

## 2017-06-24 DIAGNOSIS — R41841 Cognitive communication deficit: Secondary | ICD-10-CM | POA: Diagnosis not present

## 2017-06-26 DIAGNOSIS — R41841 Cognitive communication deficit: Secondary | ICD-10-CM | POA: Diagnosis not present

## 2017-06-27 DIAGNOSIS — R41841 Cognitive communication deficit: Secondary | ICD-10-CM | POA: Diagnosis not present

## 2017-07-01 ENCOUNTER — Other Ambulatory Visit: Payer: Self-pay | Admitting: Family Medicine

## 2017-07-01 DIAGNOSIS — R41841 Cognitive communication deficit: Secondary | ICD-10-CM | POA: Diagnosis not present

## 2017-07-01 DIAGNOSIS — Z23 Encounter for immunization: Secondary | ICD-10-CM

## 2017-07-10 ENCOUNTER — Ambulatory Visit: Payer: Medicare Other | Admitting: Family Medicine

## 2017-07-15 ENCOUNTER — Telehealth: Payer: Self-pay | Admitting: Family Medicine

## 2017-07-15 NOTE — Telephone Encounter (Signed)
Caroleen Hamman Daughter [phone]  Belenda Cruise called to inquire if Katianne has had a shingle shot. Mr Millington has recently been diganosed with shingles. Please call back and advise as soon as possible.

## 2017-07-16 DIAGNOSIS — R41841 Cognitive communication deficit: Secondary | ICD-10-CM | POA: Diagnosis not present

## 2017-07-16 NOTE — Telephone Encounter (Signed)
No shingles vaccine listed in pt's chart. Please advise.

## 2017-07-16 NOTE — Telephone Encounter (Signed)
Please let her know that I do not have a shingles vaccine on file for Jaime Mooney- unless she had one elsewhere, she has not been vaccinated per my knowledge.  However, Shingles is not contagious - does not cause shingles in other people

## 2017-07-16 NOTE — Telephone Encounter (Signed)
Pt's daughter Belenda Cruise informed of providers message. Verbalizes understanding.

## 2017-08-07 ENCOUNTER — Telehealth: Payer: Self-pay | Admitting: Family Medicine

## 2017-08-07 NOTE — Telephone Encounter (Signed)
Jaime Mooney "Devota Pace  daughter [phone] called to say pt needs shingles shot. Pt has Medicare. Does [place] need to call it in to a pharmacy near [address] [place]  Can pt come in for shot? Please call daughter.

## 2017-08-08 NOTE — Telephone Encounter (Signed)
Spoke to [employer]- this is fine, will fax in rx now

## 2017-08-29 ENCOUNTER — Encounter: Payer: Self-pay | Admitting: Family Medicine

## 2017-08-29 ENCOUNTER — Ambulatory Visit (INDEPENDENT_AMBULATORY_CARE_PROVIDER_SITE_OTHER): Payer: Medicare Other | Admitting: Family Medicine

## 2017-08-29 ENCOUNTER — Telehealth: Payer: Self-pay | Admitting: Family Medicine

## 2017-08-29 VITALS — BP 144/90 | HR 89 | Temp 97.3°F | Wt 132.4 lb

## 2017-08-29 DIAGNOSIS — L84 Corns and callosities: Secondary | ICD-10-CM | POA: Diagnosis not present

## 2017-08-29 DIAGNOSIS — B372 Candidiasis of skin and nail: Secondary | ICD-10-CM

## 2017-08-29 DIAGNOSIS — L309 Dermatitis, unspecified: Secondary | ICD-10-CM | POA: Diagnosis not present

## 2017-08-29 DIAGNOSIS — E119 Type 2 diabetes mellitus without complications: Secondary | ICD-10-CM

## 2017-08-29 DIAGNOSIS — Z794 Long term (current) use of insulin: Secondary | ICD-10-CM

## 2017-08-29 MED ORDER — FLUCONAZOLE 150 MG PO TABS: 150.0000 mg | ORAL_TABLET | Freq: Once | ORAL | 0 refills | Status: DC

## 2017-08-29 MED ORDER — FLUCONAZOLE 150 MG PO TABS: 150.0000 mg | ORAL_TABLET | Freq: Once | ORAL | 0 refills | Status: AC

## 2017-08-29 NOTE — Progress Notes (Addendum)
[place] at [facility] [address], [place], [place] [address] [phone]  Date:  08/29/2017   Name:  Jaime [place]   DOB:  09/29/1929   MRN:  [id number]  PCP:  Darreld Mclean, MD    Chief Complaint: Rash (c/o rash under breast and groin area pt states could be possible yeast. )   History of Present Illness:  Jaime [place]oney is a 83 y.o. very pleasant adult patient who presents with the following:  History of DM, NSTEMI earlier this year, here today with concern of a rash-    Her husband Jaime Mooney has been really sick- he got a terrible bedsore. He is at [facility] but hopes to be home soon  Lab Results  Component Value Date   HGBA1C 6.8 (H) 04/01/2017   She has noted a rash on her skin for 2-3 weeks, it is itchy off an on.   We treated her with diflucan once a week for 8 weeks in the spring for candida intertrigo- this worked for a while but then the rash came back under her breasts.  She also has a different sort of itchy rash on her trunk  She also has a corn on her right small toe- she is trying to put band-aids on this but it is still sore She is allergic to prednisone- has caused swelling in the past    Patient Active Problem List   Diagnosis Date Noted  . Elevated troponin   . Other forms of angina pectoris ([place])   . Chest pain 11/01/2016  . Non-STEMI (non-ST elevated myocardial infarction) ([place]) 11/01/2016  . Dyspnea 11/01/2016  . Type 2 diabetes mellitus without complication, with long-term current use of insulin ([place]) 06/19/2016  . Abnormal EKG 11/15/2014  . PVC (premature ventricular contraction)   . Urinary incontinence 12/02/2013  . Left hip pain 12/02/2013  . Left shoulder pain 12/02/2013  . Scoliosis 12/02/2013  . Allergic rhinitis due to pollen 12/02/2013    Past Medical History:  Diagnosis Date  . Allergy   . Arthritis   . Diabetes mellitus type 2, uncomplicated ([place]) 1/88/4166  . Gallstones  2010  . GERD (gastroesophageal reflux disease)   . HLD (hyperlipidemia)   . HTN (hypertension)   . NSTEMI (non-ST elevated myocardial infarction) ([place])   . PVC (premature ventricular contraction)     Past Surgical History:  Procedure Laterality Date  . BREAST SURGERY    . CARDIAC CATHETERIZATION N/A 11/02/2016   Procedure: Left Heart Cath and Coronary Angiography;  Surgeon: Belva Crome, MD;  Location: [facility];  Service: Cardiovascular;  Laterality: N/A;  . CHOLECYSTECTOMY    . EYE SURGERY     cataracts bilateral   . FRACTURE SURGERY    . gallbladder surgery 5 years ago    . left breast surgery 20 yrs ago    . nose fracture 16 yeras ago    . right breast surgery 2012      Social History   Tobacco Use  . Smoking status: Former Research scientist (life sciences)  . Smokeless tobacco: Never Used  Substance Use Topics  . Alcohol use: Yes    Comment: occasional  . Drug use: No    Family History  Problem Relation Age of Onset  . Esophageal cancer Mother        mets  . Diabetes Father   . Heart disease Father   . Stroke Father   .  Prostate cancer Father   . Skin cancer Sister   . Heart disease Maternal Grandmother   . Heart disease Maternal Grandfather   . Heart disease Paternal Grandmother   . Diabetes Paternal Grandfather     Allergies  Allergen Reactions  . Acetaminophen Swelling  . Epinephrine     Causes heart palpatations  . Lisinopril     Facial swelling and tongue swelling  . Norvasc [Amlodipine Besylate] Swelling  . Prednisone Swelling    Facial swelling, can take in small doses    Medication list has been reviewed and updated.  Current Outpatient Medications on File Prior to Visit  Medication Sig Dispense Refill  . aspirin EC 81 MG tablet Take 81 mg by mouth daily.    . calcium elemental as carbonate (TUMS ULTRA 1000) 400 MG chewable tablet Chew 1,000 mg by mouth at bedtime.    . Cholecalciferol (VITAMIN D3) 2000 units TABS Take 1 tablet by mouth daily.    .  Magnesium 250 MG TABS Take 1 tablet by mouth daily.    . Multiple Vitamins-Minerals (CENTRUM SILVER PO) Take 1 tablet by mouth daily.    . Omega-3 Fatty Acids (FISH OIL) 1000 MG CAPS Take 1 capsule by mouth daily.    Vladimir Faster Glycol-Propyl Glycol (SYSTANE OP) Apply to eye.    . vitamin A 8000 UNIT capsule Take 8,000 Units by mouth daily.    . vitamin E 400 UNIT capsule Take 400 Units by mouth daily.     No current facility-administered medications on file prior to visit.     Review of Systems:  As per HPI- otherwise negative. No fever or chills No nausea, vomiting or diarrhea   Physical Examination: Vitals:   08/29/17 1507  BP: (!) 144/90  Pulse: 89  Temp: (!) 97.3 F (36.3 C)  SpO2: 93%   Vitals:   08/29/17 1507  Weight: 132 lb 6.4 oz (60.1 kg)   Body mass index is 26.74 kg/m. Ideal Body Weight:    GEN: WDWN, NAD, Non-toxic, A & O x 3, elderly lady, looks like her normal self HEENT: Atraumatic, Normocephalic. Neck supple. No masses, No LAD.  Bilateral TM wnl, oropharynx normal.  PEERL,EOMI.   Ears and Nose: No external deformity. CV: RRR, No M/G/R. No JVD. No thrill. No extra heart sounds. PULM: CTA B, no wheezes, crackles, rhonchi. No retractions. No resp. distress. No accessory muscle use. EXTR: No c/c/e NEURO Normal gait.  PSYCH: Normally interactive. Conversant. Not depressed or anxious appearing.  Calm demeanor.  Corn on lateral right small toe Shaved with scalpel for pt She has mild candida intertrigo under her breasts, and a different type of rash on her trunk - more lacy and dry, may be just eczema   Assessment and Plan: Candidal intertrigo - Plan: fluconazole (DIFLUCAN) 150 MG tablet, DISCONTINUED: fluconazole (DIFLUCAN) 150 MG tablet  Type 2 diabetes mellitus without complication, with long-term current use of insulin (HCC) - Plan: Comprehensive metabolic panel, Hemoglobin A1c  Eczema, unspecified type  Here today with a couple of concerns Painful  corn- shaved down for her Labs to follow-up on her DM pending Will have her use fluconazole weekly for 2 weeks or so as worked in the past for her She will use a good moisturizer on her trunk Will plan further follow- up pending labs. She will let us know if she is not doing ok  Signed Lamar Blinks, MD  Received her labs 12/1  Results for orders placed or performed  in visit on 08/29/17  Comprehensive metabolic panel  Result Value Ref Range   Sodium 140 135 - 145 mEq/L   Potassium 4.5 3.5 - 5.1 mEq/L   Chloride 103 96 - 112 mEq/L   CO2 32 19 - 32 mEq/L   Glucose, Bld 128 (H) 70 - 99 mg/dL   BUN 22 6 - 23 mg/dL   Creatinine, Ser 0.62 0.40 - 1.20 mg/dL   Total Bilirubin 0.5 0.2 - 1.2 mg/dL   Alkaline Phosphatase 62 39 - 117 U/L   AST 18 0 - 37 U/L   ALT 17 0 - 35 U/L   Total Protein 7.3 6.0 - 8.3 g/dL   Albumin 4.0 3.5 - 5.2 g/dL   Calcium 9.7 8.4 - 10.5 mg/dL   GFR 96.80 >60.00 mL/min  Hemoglobin A1c  Result Value Ref Range   Hgb A1c MFr Bld 7.0 (H) 4.6 - 6.5 %   Letter to pt

## 2017-08-29 NOTE — Patient Instructions (Addendum)
I think that you may have 2 different rashes going on right now For the rash under your breasts- though to be yeast- use the diflucan once a week  The rash on the rest of your trunk may be just eczema due to dryness and cold weather.  I would like to use a steroid on this, but you have a history of allergy to this.  Please purchase some OTC Cetaphil cream (generic is ok) and use this on your skin to moisturize   I will be in touch with your labs asap

## 2017-08-29 NOTE — Telephone Encounter (Signed)
Copied from [place] [id number]. Topic: Inquiry >> Aug 29, 2017  4:36 PM Oliver Pila B wrote: Reason for CRM: pt called and asked to have her Rx send to the [facility] instead of [place] [facility], she needs them resent to that pharmacy, contact pt if needed

## 2017-08-30 LAB — COMPREHENSIVE METABOLIC PANEL
ALBUMIN: 4 g/dL (ref 3.5–5.2)
ALK PHOS: 62 U/L (ref 39–117)
ALT: 17 U/L (ref 0–35)
AST: 18 U/L (ref 0–37)
BILIRUBIN TOTAL: 0.5 mg/dL (ref 0.2–1.2)
BUN: 22 mg/dL (ref 6–23)
CALCIUM: 9.7 mg/dL (ref 8.4–10.5)
CO2: 32 meq/L (ref 19–32)
CREATININE: 0.62 mg/dL (ref 0.40–1.20)
Chloride: 103 mEq/L (ref 96–112)
GFR: 96.8 mL/min (ref >60.00)
Glucose, Bld: 128 mg/dL — ABNORMAL HIGH (ref 70–99)
Potassium: 4.5 mEq/L (ref 3.5–5.1)
Sodium: 140 mEq/L (ref 135–145)
TOTAL PROTEIN: 7.3 g/dL (ref 6.0–8.3)

## 2017-08-30 LAB — HEMOGLOBIN A1C: HEMOGLOBIN A1C: 7 % — AB (ref 4.6–6.5)

## 2017-08-30 NOTE — Telephone Encounter (Signed)
This was done yesterday- called [facility] to confirm

## 2017-08-30 NOTE — Telephone Encounter (Signed)
Please advise 

## 2017-08-30 NOTE — Telephone Encounter (Signed)
See attached.I couldn't see the Diflucan rx or I would have tried to do this transfer. Thanks.

## 2017-10-02 DIAGNOSIS — R41841 Cognitive communication deficit: Secondary | ICD-10-CM | POA: Diagnosis not present

## 2017-10-04 DIAGNOSIS — R41841 Cognitive communication deficit: Secondary | ICD-10-CM | POA: Diagnosis not present

## 2017-10-07 DIAGNOSIS — R41841 Cognitive communication deficit: Secondary | ICD-10-CM | POA: Diagnosis not present

## 2017-10-08 ENCOUNTER — Encounter: Payer: Self-pay | Admitting: Family Medicine

## 2017-10-09 DIAGNOSIS — R41841 Cognitive communication deficit: Secondary | ICD-10-CM | POA: Diagnosis not present

## 2017-10-11 DIAGNOSIS — R41841 Cognitive communication deficit: Secondary | ICD-10-CM | POA: Diagnosis not present

## 2017-10-11 NOTE — Progress Notes (Deleted)
Subjective:   Jaime Mooney is a 84 y.o. female who presents for Medicare Annual (Subsequent) preventive examination. The Patient was informed that the wellness visit is to identify future health risk and educate and initiate measures that can reduce risk for increased disease through the lifespan.   Describes health as fair, good or great?  Review of Systems: No ROS.  Medicare Wellness Visit. Additional risk factors are reflected in the social history.   Sleep patterns: Home Safety/Smoke Alarms: Feels safe in home. Smoke alarms in place.    Female:   Pap-       Mammo-       Dexa scan- last 01/07/17-osteoporosis       CCS- No longer doing routine screening due to age.     Objective:     Vitals: There were no vitals taken for this visit.  There is no height or weight on file to calculate BMI.  Advanced Directives 11/01/2016  Does Patient Have a Medical Advance Directive? Yes  Type of Paramedic of [place];Living will  Does patient want to make changes to medical advance directive? No - Patient declined    Tobacco Social History   Tobacco Use  Smoking Status Former Smoker  Smokeless Tobacco Never Used     Counseling given: Not Answered   Clinical Intake:                       Past Medical History:  Diagnosis Date  . Allergy   . Arthritis   . Diabetes mellitus type 2, uncomplicated ([facility] [place]) 1/69/6789  . Gallstones 2010  . GERD (gastroesophageal reflux disease)   . HLD (hyperlipidemia)   . HTN (hypertension)   . NSTEMI (non-ST elevated myocardial infarction) ([place])   . PVC (premature ventricular contraction)    Past Surgical History:  Procedure Laterality Date  . BREAST SURGERY    . CARDIAC CATHETERIZATION N/A 11/02/2016   Procedure: Left Heart Cath and Coronary Angiography;  Surgeon: Belva Crome, MD;  Location: [facility];  Service: Cardiovascular;  Laterality: N/A;  . CHOLECYSTECTOMY    . EYE SURGERY     cataracts  bilateral   . FRACTURE SURGERY    . gallbladder surgery 5 years ago    . left breast surgery 20 yrs ago    . nose fracture 16 yeras ago    . right breast surgery 2012     Family History  Problem Relation Age of Onset  . Esophageal cancer Mother        mets  . Diabetes Father   . Heart disease Father   . Stroke Father   . Prostate cancer Father   . Skin cancer Sister   . Heart disease Maternal Grandmother   . Heart disease Maternal Grandfather   . Heart disease Paternal Grandmother   . Diabetes Paternal Grandfather    Social History   Socioeconomic History  . Marital status: Married    Spouse name: Not on file  . Number of children: 3  . Years of education: Not on file  . Highest education level: Not on file  Social Needs  . Financial resource strain: Not on file  . Food insecurity - worry: Not on file  . Food insecurity - inability: Not on file  . Transportation needs - medical: Not on file  . Transportation needs - non-medical: Not on file  Occupational History  . Occupation: retired  Tobacco Use  .  Smoking status: Former Research scientist (life sciences)  . Smokeless tobacco: Never Used  Substance and Sexual Activity  . Alcohol use: Yes    Comment: occasional  . Drug use: No  . Sexual activity: Not on file  Other Topics Concern  . Not on file  Social History Narrative   Married.    2 sons, one daughter    Education: Other.    Outpatient Encounter Medications as of 10/17/2017  Medication Sig  . aspirin EC 81 MG tablet Take 81 mg by mouth daily.  . calcium elemental as carbonate (TUMS ULTRA 1000) 400 MG chewable tablet Chew 1,000 mg by mouth at bedtime.  . Cholecalciferol (VITAMIN D3) 2000 units TABS Take 1 tablet by mouth daily.  . Magnesium 250 MG TABS Take 1 tablet by mouth daily.  . Multiple Vitamins-Minerals (CENTRUM SILVER PO) Take 1 tablet by mouth daily.  . Omega-3 Fatty Acids (FISH OIL) 1000 MG CAPS Take 1 capsule by mouth daily.  Vladimir Faster Glycol-Propyl Glycol (SYSTANE  OP) Apply to eye.  . vitamin A 8000 UNIT capsule Take 8,000 Units by mouth daily.  . vitamin E 400 UNIT capsule Take 400 Units by mouth daily.   No facility-administered encounter medications on file as of 10/17/2017.     Activities of Daily Living In your present state of health, do you have any difficulty performing the following activities: 12/26/2016 11/01/2016  Hearing? Y N  Comment Frequent thumping in ears -  Vision? N N  Difficulty concentrating or making decisions? Y N  Walking or climbing stairs? Y N  Comment Has to go slow and hold on to something -  Dressing or bathing? N N  Doing errands, shopping? Y N  Comment Has to be accompanied -  Some recent data might be hidden    Patient Care Team: Copland, Gay Filler, MD as PCP - General (Family Medicine)    Assessment:   This is a routine wellness examination for Jaime Mooney. Physical assessment deferred to PCP.  Exercise Activities and Dietary recommendations   Diet (meal preparation, eat out, water intake, caffeinated beverages, dairy products, fruits and vegetables): {Desc; diets:16563} Breakfast: Lunch:  Dinner:      Goals    None      Fall Risk Fall Risk  12/26/2016 02/13/2016 12/13/2014 12/02/2013 12/02/2013  Falls in the past year? Yes Yes Yes Yes Yes  Comment - - tripped over the coffee table - -  Number falls in past yr: 1 1 - 1 1  Injury with Fall? Yes No - Yes Yes  Comment - - - - hit the back of head and had a huge knot with dizziness  Risk Factor Category  High Fall Risk - - (No Data) -  Comment - - - Low if orthopedics gets involved -  Risk for fall due to : Impaired balance/gait - - Other (Comment) Other (Comment)  Risk for fall due to: Comment - - - Low risk if orthopedics gets involved slipped by wlking on grass hay  Follow up Falls prevention discussed - - - -    Depression Screen PHQ 2/9 Scores 12/26/2016 02/13/2016 06/28/2015 12/13/2014  PHQ - 2 Score 0 0 0 0  PHQ- 9 Score - - - -  Exception Documentation  Patient refusal - - -     Cognitive Function        Immunization History  Administered Date(s) Administered  . Influenza, High Dose Seasonal PF 06/14/2016, 07/01/2017  . Influenza,inj,Quad PF,6+ Mos 05/31/2014, 08/10/2015  .  Influenza-Unspecified 07/01/2013  . Pneumococcal Conjugate-13 02/13/2016  . Td 08/01/2013   Screening Tests Health Maintenance  Topic Date Due  . URINE MICROALBUMIN  10/03/1940  . OPHTHALMOLOGY EXAM  12/13/2016  . PNA vac Low Risk Adult (2 of 2 - PPSV23) 02/12/2017  . FOOT EXAM  12/26/2017  . HEMOGLOBIN A1C  02/26/2018  . TETANUS/TDAP  12/03/2023  . INFLUENZA VACCINE  Completed  . DEXA SCAN  Completed      Plan:   ***   I have personally reviewed and noted the following in the patient's chart:   . Medical and social history . Use of alcohol, tobacco or illicit drugs  . Current medications and supplements . Functional ability and status . Nutritional status . Physical activity . Advanced directives . List of other physicians . Hospitalizations, surgeries, and ER visits in previous 12 months . Vitals . Screenings to include cognitive, depression, and falls . Referrals and appointments  In addition, I have reviewed and discussed with patient certain preventive protocols, quality metrics, and best practice recommendations. A written personalized care plan for preventive services as well as general preventive health recommendations were provided to patient.     Shela Nevin, [place]  10/11/2017

## 2017-10-14 DIAGNOSIS — R41841 Cognitive communication deficit: Secondary | ICD-10-CM | POA: Diagnosis not present

## 2017-10-16 DIAGNOSIS — R41841 Cognitive communication deficit: Secondary | ICD-10-CM | POA: Diagnosis not present

## 2017-10-16 NOTE — Progress Notes (Addendum)
Jaime Mooney at [facility] [address] [place], [place] [address] [phone]  Date:  10/17/2017   Name:  Jaime Mooney   DOB:  02/15/1929   MRN:  [id number]  PCP:  Darreld Mclean, MD    Chief Complaint: Annual Exam (Pt here for CPE.)   History of Present Illness:  [name] is a 84 y.o. very pleasant adult patient who presents with the following:  Here today for a follow- up visit. History of DM, CAD/ NSTEMI  I had received the following email from her daughter Jaime Mooney recently:  will be bring my Mother in next week for a check up. She is not doing well at all. The stress of taking care of Daddy has affected her. This weekend I had 2 of Daddys nurses, his PT and [employer] personnel call me about her. She cannot communicate very well- word recall or getting idea out. Some days are better but she is causing havoc with my Fathers care.  We had to place Daddys meds out of her reach and have nurses in control of them. She is getting angry and says doesn't like not knowing whats going on. I call her details and send simple paperwork with details listed but she is still confused. She REFUSES to put a bedside commode in their bedroom at night- says its nasty. She says if Daddy needs to use it at night then he can hold onto furniture to get in bathroom. I had skilled nursing in 24/7 for the first 3 weeks when he got home but it now daily visits- she is making it so hard for staff to take care of him.  We need help.  I last saw her in November- we treated her for candida intertrigo. She reports that this got much better She notes that she is feeling "very tired," her bilateal toes and feet are hurting her more Her left hip is also painful She is getting speech therapy- they would like to have PT as well for her to help work on her strength and endurance, and also to also to do some modalities for her left hip pain such  as heat or cold therapy We can arrange this via home health: [facility] is the group she is using right now  She feels like she is sleeping pretty well, she does not feel like her sleep wake schedule is really off She sees Dr. Kathrin Penner for her eyes and is due for a visit- they will do this   Her daughter Jaime Mooney is concerned about agitation and confusion- Cambelle does not feel like this is a problem, but she notes that she sometimes feels like she does get in a panic, especially where the care of her husband Jaime Mooney is concerned.  Discussed a few different options like trying trazodone to help her sleep, Namenda for her memory, or a low dose of something like rrispiral to help with anxiety.  She states that she will refuse to take any of these medications.   Cricket also brings in a letter from [employer], a Electrical engineer.  This states that they have tested Jaime Mooney on the Brief Cognitive Assessment tool twice, in 8/18 and 1/19 and she scored in the dementia range on both occasions.  Her deficits cause difficulty with managing Jaime Mooney's thickened liquids and medications, difficulty with problem solving and reasoning and impaired executive function.   BP Readings from Last 3 Encounters:  10/17/17 124/82  08/29/17 (!) 144/90  04/01/17 102/72   Lab Results  Component Value Date   HGBA1C 7.0 (H) 08/29/2017    Patient Active Problem List   Diagnosis Date Noted  . Elevated troponin   . Other forms of angina pectoris ([facility])   . Chest pain 11/01/2016  . Non-STEMI (non-ST elevated myocardial infarction) ([facility]) 11/01/2016  . Dyspnea 11/01/2016  . Type 2 diabetes mellitus without complication, with long-term current use of insulin ([facility]) 06/19/2016  . Abnormal EKG 11/15/2014  . PVC (premature ventricular contraction)   . Urinary incontinence 12/02/2013  . Left hip pain 12/02/2013  . Left shoulder pain 12/02/2013  . Scoliosis 12/02/2013  . Allergic rhinitis due to pollen 12/02/2013    Past Medical  History:  Diagnosis Date  . Allergy   . Arthritis   . Diabetes mellitus type 2, uncomplicated ([facility]) 01/07/1447  . Gallstones 2010  . GERD (gastroesophageal reflux disease)   . HLD (hyperlipidemia)   . HTN (hypertension)   . NSTEMI (non-ST elevated myocardial infarction) ([facility])   . PVC (premature ventricular contraction)     Past Surgical History:  Procedure Laterality Date  . BREAST SURGERY    . CARDIAC CATHETERIZATION N/A 11/02/2016   Procedure: Left Heart Cath and Coronary Angiography;  Surgeon: Belva Crome, MD;  Location: [facility];  Service: Cardiovascular;  Laterality: N/A;  . CHOLECYSTECTOMY    . EYE SURGERY     cataracts bilateral   . FRACTURE SURGERY    . gallbladder surgery 5 years ago    . left breast surgery 20 yrs ago    . nose fracture 16 yeras ago    . right breast surgery 2012      Social History   Tobacco Use  . Smoking status: Former Research scientist (life sciences)  . Smokeless tobacco: Never Used  Substance Use Topics  . Alcohol use: Yes    Comment: occasional  . Drug use: No    Family History  Problem Relation Age of Onset  . Esophageal cancer Mother        mets  . Diabetes Father   . Heart disease Father   . Stroke Father   . Prostate cancer Father   . Skin cancer Sister   . Heart disease Maternal Grandmother   . Heart disease Maternal Grandfather   . Heart disease Paternal Grandmother   . Diabetes Paternal Grandfather     Allergies  Allergen Reactions  . Acetaminophen Swelling  . Epinephrine     Causes heart palpatations  . Lisinopril     Facial swelling and tongue swelling  . Norvasc [Amlodipine Besylate] Swelling  . Prednisone Swelling    Facial swelling, can take in small doses    Medication list has been reviewed and updated.  Current Outpatient Medications on File Prior to Visit  Medication Sig Dispense Refill  . aspirin EC 81 MG tablet Take 81 mg by mouth daily.    . calcium elemental as carbonate (TUMS ULTRA 1000) 400 MG chewable tablet  Chew 1,000 mg by mouth at bedtime.    . Cholecalciferol (VITAMIN D3) 2000 units TABS Take 1 tablet by mouth daily.    . Magnesium 250 MG TABS Take 1 tablet by mouth daily.    . Multiple Vitamins-Minerals (CENTRUM SILVER PO) Take 1 tablet by mouth daily.    . Omega-3 Fatty Acids (FISH OIL) 1000 MG CAPS Take 1 capsule by mouth daily.    Vladimir Faster Glycol-Propyl Glycol (SYSTANE OP) Apply  to eye.    . vitamin A 8000 UNIT capsule Take 8,000 Units by mouth daily.    . vitamin E 400 UNIT capsule Take 400 Units by mouth daily.     No current facility-administered medications on file prior to visit.     Review of Systems:  As per HPI- otherwise negative.   Physical Examination: Vitals:   10/17/17 1058  BP: 124/82  Pulse: 60  Temp: (!) 97.5 F (36.4 C)  SpO2: 98%   Vitals:   10/17/17 1058  Weight: 132 lb 9.6 oz (60.1 kg)  Height: 4\' 11"  (1.499 m)   Body mass index is 26.78 kg/m. Ideal Body Weight: Weight in (lb) to have BMI = 25: 123.5  GEN: WDWN, NAD, Non-toxic, A & O x 3, elderly woman who appears well. Here today with her husband and daughter HEENT: Atraumatic, Normocephalic. Neck supple. No masses, No LAD. Ears and Nose: No external deformity. CV: RRR, No M/G/R. No JVD. No thrill. No extra heart sounds. PULM: CTA B, no wheezes, crackles, rhonchi. No retractions. No resp. distress. No accessory muscle use. ABD: S, NT, ND EXTR: No c/c/e NEURO Normal gait.  PSYCH: Normally interactive. Conversant. Not depressed or anxious appearing.  Calm demeanor.  Her feet are normal except for loss of plantar fat pad.  She is wearing a dress flat today but states that she normally does wear well padded athletic type shoes Pt notes tenderness over the left SI joint.  The actual hip joint seems normal- no pain with ROM and she has full ROM at exam today    Assessment and Plan: Other idiopathic scoliosis, thoracolumbar region  Bilateral foot pain  Type 2 diabetes mellitus without  complication, with long-term current use of insulin (HCC)  Left hip pain - Plan: Ambulatory referral to Home Health  Confusion - Plan: Ambulatory referral to [facility] elderly - Plan: Ambulatory referral to [facility]   Discussed trying a medication for memory or for agitation but she is not really interested at this time. Pt does not feel like she is confused at all but her daughter and husband do not agree.  However Kalisha is adamant that she will not take any other medication for these symptoms and at this point I do not think we will be able to convince her Will continue to try and minimize her agitation but keeping a stable routine.  Also encouraged an eye exam DM is diet controlled - recent A1c satisfactory BP is fine Foot pain- feet are normal on exam with good circulation.  Encouraged her to wear supportive shoes at all times Hip pain- normal ROM of her left hip.  Offered x-rays but she declines right now We will start home PT for her to work on her strength and endurance.  Cricket notes that while Jaime Mooney was at rehab in [place] had to walk a lot more, and seemed to be getting around better while she was more active   Completed VA benefit forms for her today as well  Signed Lamar Blinks, MD

## 2017-10-17 ENCOUNTER — Ambulatory Visit (INDEPENDENT_AMBULATORY_CARE_PROVIDER_SITE_OTHER): Payer: Medicare Other | Admitting: Family Medicine

## 2017-10-17 ENCOUNTER — Ambulatory Visit: Payer: Medicare Other

## 2017-10-17 ENCOUNTER — Ambulatory Visit: Payer: Medicare Other | Admitting: *Deleted

## 2017-10-17 ENCOUNTER — Encounter: Payer: Self-pay | Admitting: Family Medicine

## 2017-10-17 VITALS — BP 124/82 | HR 60 | Temp 97.5°F | Ht 59.0 in | Wt 132.6 lb

## 2017-10-17 DIAGNOSIS — E119 Type 2 diabetes mellitus without complications: Secondary | ICD-10-CM

## 2017-10-17 DIAGNOSIS — R54 Age-related physical debility: Secondary | ICD-10-CM | POA: Diagnosis not present

## 2017-10-17 DIAGNOSIS — M79672 Pain in left foot: Secondary | ICD-10-CM | POA: Diagnosis not present

## 2017-10-17 DIAGNOSIS — M79671 Pain in right foot: Secondary | ICD-10-CM | POA: Diagnosis not present

## 2017-10-17 DIAGNOSIS — Z794 Long term (current) use of insulin: Secondary | ICD-10-CM

## 2017-10-17 DIAGNOSIS — F03B0 Unspecified dementia, moderate, without behavioral disturbance, psychotic disturbance, mood disturbance, and anxiety: Secondary | ICD-10-CM

## 2017-10-17 DIAGNOSIS — F0390 Unspecified dementia without behavioral disturbance: Secondary | ICD-10-CM

## 2017-10-17 DIAGNOSIS — M25552 Pain in left hip: Secondary | ICD-10-CM | POA: Diagnosis not present

## 2017-10-17 DIAGNOSIS — R410 Disorientation, unspecified: Secondary | ICD-10-CM

## 2017-10-17 DIAGNOSIS — R41841 Cognitive communication deficit: Secondary | ICD-10-CM | POA: Diagnosis not present

## 2017-10-17 DIAGNOSIS — M4125 Other idiopathic scoliosis, thoracolumbar region: Secondary | ICD-10-CM

## 2017-10-17 NOTE — Patient Instructions (Addendum)
I would encourage the fish oil and baby aspirin Please go and see your eye doctor soon- Dr. Kathrin Penner Your blood pressure looks fine with no medication at this time

## 2017-10-18 ENCOUNTER — Encounter: Payer: Self-pay | Admitting: Family Medicine

## 2017-10-18 NOTE — Addendum Note (Signed)
Addended by: Lamar Blinks C on: 10/18/2017 06:47 AM   Modules accepted: Orders

## 2017-10-19 DIAGNOSIS — F0280 Dementia in other diseases classified elsewhere without behavioral disturbance: Secondary | ICD-10-CM | POA: Insufficient documentation

## 2017-10-19 DIAGNOSIS — G309 Alzheimer's disease, unspecified: Secondary | ICD-10-CM

## 2017-10-23 DIAGNOSIS — R41841 Cognitive communication deficit: Secondary | ICD-10-CM | POA: Diagnosis not present

## 2017-10-24 ENCOUNTER — Telehealth: Payer: Self-pay | Admitting: Family Medicine

## 2017-10-24 NOTE — Telephone Encounter (Signed)
Returned call from [facility] to ok verbal orders that were requested for physical therapy. Left voicemail stating that this is ok.

## 2017-10-24 NOTE — Telephone Encounter (Signed)
Copied from [facility] [id number]. Topic: General - Other >> Oct 24, 2017 12:13 PM Darl Householder, RMA wrote: Reason for CRM: Gracee from [facility] [facility] is requesting verbal orders for physical therapy twice a week x6 weeks, please return call to [facility] at [phone]

## 2017-10-25 ENCOUNTER — Telehealth: Payer: Self-pay | Admitting: *Deleted

## 2017-10-25 DIAGNOSIS — R41841 Cognitive communication deficit: Secondary | ICD-10-CM | POA: Diagnosis not present

## 2017-10-25 NOTE — Telephone Encounter (Signed)
Received Physician Orders from [facility]; forwarded to provider/SLS 01/25

## 2017-10-28 DIAGNOSIS — R41841 Cognitive communication deficit: Secondary | ICD-10-CM | POA: Diagnosis not present

## 2017-10-30 ENCOUNTER — Ambulatory Visit: Payer: Self-pay | Admitting: *Deleted

## 2017-10-30 DIAGNOSIS — R41841 Cognitive communication deficit: Secondary | ICD-10-CM | POA: Diagnosis not present

## 2017-10-30 NOTE — Telephone Encounter (Signed)
FYI. Pt headed to ED.  

## 2017-10-30 NOTE — Telephone Encounter (Signed)
Pt's daughter, Belenda Cruise, called stating that her dad had 2 loose stools yesterday, and that the pt and his wife had gurgling stomachs and 5 episodes of diarrhea when the care giver came in; Initial assessment answers per Belenda Cruise; recommendations made per nurse triage and Consuello Bossier understanding and agreed that she will send the pt to urgent care/ED; will route to [place] for notification of this encounter.    Reason for Disposition . [1] SEVERE diarrhea (e.g., 7 or more times / day more than normal) AND [2]  age > 60 years  Answer Assessment - Initial Assessment Questions 1. DIARRHEA SEVERITY: "How bad is the diarrhea?" "How many extra stools have you had in the past 24 hours than normal?"    - MILD: Few loose or mushy BMs; increase of 1-3 stools over normal daily number of stools; mild increase in ostomy output.   - MODERATE: Increase of 4-6 stools daily over normal; moderate increase in ostomy output.   - SEVERE (or Worst Possible): Increase of 7 or more stools daily over normal; moderate increase in ostomy output; incontinence.     severe 2. ONSET: "When did the diarrhea begin?"      10/29/16 3. BM CONSISTENCY: "How loose or watery is the diarrhea?"      loose 4. VOMITING: "Are you also vomiting?" If so, ask: "How many times in the past 24 hours?"      no 5. ABDOMINAL PAIN: "Are you having any abdominal pain?" If yes: "What does it feel like?" (e.g., crampy, dull, intermittent, constant)      no 6. ABDOMINAL PAIN SEVERITY: If present, ask: "How bad is the pain?"  (e.g., Scale 1-10; mild, moderate, or severe)    - MILD (1-3): doesn't interfere with normal activities, abdomen soft and not tender to touch     - MODERATE (4-7): interferes with normal activities or awakens from sleep, tender to touch     - SEVERE (8-10): excruciating pain, doubled over, unable to do any normal activities       no 7. ORAL INTAKE: If vomiting, "Have you been able to drink liquids?" "How much  fluids have you had in the past 24 hours?"     unsure 8. HYDRATION: "Any signs of dehydration?" (e.g., dry mouth [not just dry lips], too weak to stand, dizziness, new weight loss) "When did you last urinate?"     unsure 9. EXPOSURE: "Have you traveled to a foreign country recently?" "Have you been exposed to anyone with diarrhea?" "Could you have eaten any food that was spoiled?"     no 10. OTHER SYMPTOMS: "Do you have any other symptoms?" (e.g., fever, blood in stool)       no 11. PREGNANCY: "Is there any chance you are pregnant?" "When was your last menstrual period?"       no  Protocols used: DIARRHEA-A-AH

## 2017-10-31 ENCOUNTER — Other Ambulatory Visit: Payer: Self-pay

## 2017-10-31 ENCOUNTER — Emergency Department (HOSPITAL_BASED_OUTPATIENT_CLINIC_OR_DEPARTMENT_OTHER): Payer: Medicare Other

## 2017-10-31 ENCOUNTER — Telehealth: Payer: Self-pay | Admitting: *Deleted

## 2017-10-31 ENCOUNTER — Emergency Department (HOSPITAL_BASED_OUTPATIENT_CLINIC_OR_DEPARTMENT_OTHER)
Admission: EM | Admit: 2017-10-31 | Discharge: 2017-10-31 | Disposition: A | Discharge status: Home / Self Care | Payer: Medicare Other | Attending: Emergency Medicine | Admitting: Emergency Medicine

## 2017-10-31 ENCOUNTER — Encounter (HOSPITAL_BASED_OUTPATIENT_CLINIC_OR_DEPARTMENT_OTHER): Payer: Self-pay | Admitting: *Deleted

## 2017-10-31 DIAGNOSIS — M546 Pain in thoracic spine: Secondary | ICD-10-CM

## 2017-10-31 DIAGNOSIS — I10 Essential (primary) hypertension: Secondary | ICD-10-CM | POA: Diagnosis not present

## 2017-10-31 DIAGNOSIS — I252 Old myocardial infarction: Secondary | ICD-10-CM | POA: Insufficient documentation

## 2017-10-31 DIAGNOSIS — E119 Type 2 diabetes mellitus without complications: Secondary | ICD-10-CM | POA: Insufficient documentation

## 2017-10-31 DIAGNOSIS — Z87891 Personal history of nicotine dependence: Secondary | ICD-10-CM | POA: Diagnosis not present

## 2017-10-31 DIAGNOSIS — R197 Diarrhea, unspecified: Secondary | ICD-10-CM | POA: Insufficient documentation

## 2017-10-31 DIAGNOSIS — Z7982 Long term (current) use of aspirin: Secondary | ICD-10-CM | POA: Diagnosis not present

## 2017-10-31 DIAGNOSIS — Z794 Long term (current) use of insulin: Secondary | ICD-10-CM | POA: Diagnosis not present

## 2017-10-31 DIAGNOSIS — Z79899 Other long term (current) drug therapy: Secondary | ICD-10-CM | POA: Diagnosis not present

## 2017-10-31 DIAGNOSIS — R0602 Shortness of breath: Secondary | ICD-10-CM | POA: Diagnosis not present

## 2017-10-31 LAB — URINALYSIS, ROUTINE W REFLEX MICROSCOPIC
Bilirubin Urine: NEGATIVE
Glucose, UA: NEGATIVE mg/dL
Ketones, ur: 15 mg/dL — AB
Nitrite: NEGATIVE
Protein, ur: NEGATIVE mg/dL
Specific Gravity, Urine: 1.01 (ref 1.005–1.030)
pH: 6.5 (ref 5.0–8.0)

## 2017-10-31 LAB — URINALYSIS, MICROSCOPIC (REFLEX)

## 2017-10-31 LAB — COMPREHENSIVE METABOLIC PANEL
ALT: 18 U/L (ref 14–54)
AST: 20 U/L (ref 15–41)
Albumin: 3.9 g/dL (ref 3.5–5.0)
Alkaline Phosphatase: 67 U/L (ref 38–126)
Anion gap: 8 (ref 5–15)
BUN: 22 mg/dL — ABNORMAL HIGH (ref 6–20)
CO2: 27 mmol/L (ref 22–32)
Calcium: 9 mg/dL (ref 8.9–10.3)
Chloride: 104 mmol/L (ref 101–111)
Creatinine, Ser: 0.5 mg/dL (ref 0.44–1.00)
GFR calc Af Amer: >60 mL/min (ref >60)
GFR calc non Af Amer: >60 mL/min (ref >60)
Glucose, Bld: 118 mg/dL — ABNORMAL HIGH (ref 65–99)
Potassium: 3.6 mmol/L (ref 3.5–5.1)
Sodium: 139 mmol/L (ref 135–145)
Total Bilirubin: 0.5 mg/dL (ref 0.3–1.2)
Total Protein: 7.4 g/dL (ref 6.5–8.1)

## 2017-10-31 LAB — CBC WITH DIFFERENTIAL/PLATELET
Basophils Absolute: 0 10*3/uL (ref 0.0–0.1)
Basophils Relative: 0 %
Eosinophils Absolute: 0.1 10*3/uL (ref 0.0–0.7)
Eosinophils Relative: 1 %
HCT: 41.6 % (ref 36.0–46.0)
Hemoglobin: 13.5 g/dL (ref 12.0–15.0)
Lymphocytes Relative: 19 %
Lymphs Abs: 1.5 10*3/uL (ref 0.7–4.0)
MCH: 32 pg (ref 26.0–34.0)
MCHC: 32.5 g/dL (ref 30.0–36.0)
MCV: 98.6 fL (ref 78.0–100.0)
Monocytes Absolute: 0.5 10*3/uL (ref 0.1–1.0)
Monocytes Relative: 7 %
Neutro Abs: 5.8 10*3/uL (ref 1.7–7.7)
Neutrophils Relative %: 73 %
Platelets: 190 10*3/uL (ref 150–400)
RBC: 4.22 MIL/uL (ref 3.87–5.11)
RDW: 12.6 % (ref 11.5–15.5)
WBC: 7.9 10*3/uL (ref 4.0–10.5)

## 2017-10-31 LAB — LIPASE, BLOOD: Lipase: 20 U/L (ref 11–51)

## 2017-10-31 MED ORDER — NAPROXEN 500 MG PO TABS: 500.0000 mg | ORAL_TABLET | Freq: Two times a day (BID) | ORAL | 0 refills | Status: DC

## 2017-10-31 MED ORDER — MORPHINE SULFATE (PF) 2 MG/ML IV SOLN
2.0000 mg | Freq: Once | INTRAVENOUS | Status: AC
  Administered 2017-10-31: 2 mg via INTRAVENOUS
  Filled 2017-10-31: qty 1

## 2017-10-31 MED ORDER — NAPROXEN 250 MG PO TABS
500.0000 mg | ORAL_TABLET | Freq: Once | ORAL | Status: AC
  Administered 2017-10-31: 500 mg via ORAL
  Filled 2017-10-31: qty 2

## 2017-10-31 MED ORDER — SODIUM CHLORIDE 0.9 % IV BOLUS (SEPSIS)
500.0000 mL | Freq: Once | INTRAVENOUS | Status: AC
  Administered 2017-10-31: 500 mL via INTRAVENOUS

## 2017-10-31 NOTE — ED Triage Notes (Signed)
Pt reports upper back pain and tenderness x Friday, also both she and her husband have had pudding soft stools x Friday also.

## 2017-10-31 NOTE — Telephone Encounter (Signed)
Received Physician Orders from [facility]; forwarded to provider/SLS 01/31

## 2017-10-31 NOTE — ED Provider Notes (Signed)
Jaime Mooney Provider Note   CSN: [id number] Arrival date & time: 10/31/17  1000     History   Chief Complaint Chief Complaint  Patient presents with  . Back Pain    HPI Jaime Mooney is a 84 y.o. adult with history of arthritis, DM, D, HTN, and NSTEMI presents today with chief complaint acute onset, progressively worsening thoracic back pain which began 2 days ago.  Pain is a dull ache, worsens with movement and palpation.  She has tried aspirin without significant relief of her symptoms.  Pain will occasionally radiate up into her shoulder and her neck.  She does have a history of significant scoliosis.  She endorses mild shortness of breath but denies chest pain.  States she thinks her symptoms began as a result of increased activity and bending and lifting while doing laundry.  She denies numbness, tingling, or weakness.  She denies fevers or chills.  She also endorses multiple episodes of watery nonbloody stools for 3 days.  She states this began approximately 20 minutes after eating at her nursing home with her husband.  She states to coconut and beef dish.  She states that she has been having approximately 3 episodes of diarrhea daily.  She states her husband has more severe symptoms.  She denies abdominal pain, nausea, or vomiting. Denies urinary symptoms.  No recent travel or surgeries, no hemoptysis, no prior history of DVT or PE, and she is not on estrogen replacement therapy.  She is not on blood thinners.  The history is provided by the patient.    Past Medical History:  Diagnosis Date  . Allergy   . Arthritis   . Diabetes mellitus type 2, uncomplicated ([place]) 04/02/5008  . Gallstones 2010  . GERD (gastroesophageal reflux disease)   . HLD (hyperlipidemia)   . HTN (hypertension)   . NSTEMI (non-ST elevated myocardial infarction) ([place])   . PVC (premature ventricular contraction)     Patient Active Problem List   Diagnosis Date Noted  .  Moderate dementia without behavioral disturbance 10/19/2017  . Elevated troponin   . Other forms of angina pectoris ([place])   . Chest pain 11/01/2016  . Non-STEMI (non-ST elevated myocardial infarction) ([place]) 11/01/2016  . Dyspnea 11/01/2016  . Type 2 diabetes mellitus without complication, with long-term current use of insulin ([place]) 06/19/2016  . Abnormal EKG 11/15/2014  . PVC (premature ventricular contraction)   . Urinary incontinence 12/02/2013  . Left hip pain 12/02/2013  . Left shoulder pain 12/02/2013  . Scoliosis 12/02/2013  . Allergic rhinitis due to pollen 12/02/2013    Past Surgical History:  Procedure Laterality Date  . BREAST SURGERY    . CARDIAC CATHETERIZATION N/A 11/02/2016   Procedure: Left Heart Cath and Coronary Angiography;  Surgeon: Belva Crome, MD;  Location: [facility];  Service: Cardiovascular;  Laterality: N/A;  . CHOLECYSTECTOMY    . EYE SURGERY     cataracts bilateral   . FRACTURE SURGERY    . gallbladder surgery 5 years ago    . left breast surgery 20 yrs ago    . nose fracture 16 yeras ago    . right breast surgery 2012      OB History    No data available       Home Medications    Prior to Admission medications   Medication Sig Start Date End Date Taking? Authorizing Provider  aspirin EC 81 MG tablet Take 81 mg by mouth  daily.    [provider]  calcium elemental as carbonate (TUMS ULTRA 1000) 400 MG chewable tablet Chew 1,000 mg by mouth at bedtime.    [provider]  Cholecalciferol (VITAMIN D3) 2000 units TABS Take 1 tablet by mouth daily.    [provider]  Magnesium 250 MG TABS Take 1 tablet by mouth daily.    [provider]  Multiple Vitamins-Minerals (CENTRUM SILVER PO) Take 1 tablet by mouth daily.    [provider]  naproxen (NAPROSYN) 500 MG tablet Take 1 tablet (500 mg total) by mouth 2 (two) times daily with a meal. 10/31/17   Heith Haigler A, PA-C  Omega-3 Fatty Acids (FISH  OIL) 1000 MG CAPS Take 1 capsule by mouth daily.    [provider]  Polyethyl Glycol-Propyl Glycol (SYSTANE OP) Apply to eye.    [provider]  vitamin A 8000 UNIT capsule Take 8,000 Units by mouth daily.    [provider]  vitamin E 400 UNIT capsule Take 400 Units by mouth daily.    [provider]    Family History Family History  Problem Relation Age of Onset  . Esophageal cancer Mother        mets  . Diabetes Father   . Heart disease Father   . Stroke Father   . Prostate cancer Father   . Skin cancer Sister   . Heart disease Maternal Grandmother   . Heart disease Maternal Grandfather   . Heart disease Paternal Grandmother   . Diabetes Paternal Grandfather     Social History Social History   Tobacco Use  . Smoking status: Former Research scientist (life sciences)  . Smokeless tobacco: Never Used  Substance Use Topics  . Alcohol use: Yes    Comment: occasional  . Drug use: No     Allergies   Acetaminophen; Epinephrine; Lisinopril; Norvasc [amlodipine besylate]; and Prednisone   Review of Systems Review of Systems  Constitutional: Negative for chills, fatigue and fever.  Respiratory: Positive for shortness of breath. Negative for cough.   Cardiovascular: Negative for chest pain.  Gastrointestinal: Positive for diarrhea. Negative for abdominal pain, blood in stool, nausea and vomiting.  Genitourinary: Negative for dysuria and hematuria.  Musculoskeletal: Positive for back pain.  Neurological: Negative for syncope, weakness and numbness.  All other systems reviewed and are negative.    Physical Exam Updated Vital Signs BP 135/85 (BP Location: Left Arm)   Pulse 76   Temp 98.1 F (36.7 C) (Oral)   Resp 14   SpO2 96%   Physical Exam  Constitutional: She appears well-developed and well-nourished. No distress.  HENT:  Head: Normocephalic and atraumatic.  Eyes: Conjunctivae are normal. Right eye exhibits no discharge. Left eye exhibits no  discharge.  Neck: Normal range of motion. Neck supple. No JVD present. No tracheal deviation present.  Cardiovascular: Normal rate, regular rhythm, normal heart sounds and intact distal pulses.  2+ radial and DP/PT pulses bl, negative Homan's bl   Pulmonary/Chest: Effort normal and breath sounds normal. No stridor. No respiratory distress. She has no wheezes. She has no rales. She exhibits tenderness.  Mild anterior chest wall tenderness to palpation with no deformity, crepitus, or paradoxical wall motion  Abdominal: Soft. Bowel sounds are normal. She exhibits no distension. There is no tenderness.  no CVA tenderness, Murphy sign absent, Rovsing's absent  Musculoskeletal: Normal range of motion. She exhibits tenderness. She exhibits no edema.  Large right-sided curve noted to the thoracic spine with primarily right-sided  parathoracic tenderness to palpation.  No midline spine tenderness.  No lumbar spine tenderness.  No deformity, crepitus, or step-off noted.  5/5 strength of BUE major muscle groups.  Neurological: She is alert. No cranial nerve deficit or sensory deficit. She exhibits normal muscle tone.  Fluent speech, no facial droop, sensation intact to soft touch of bilateral upper extremities  Skin: Skin is warm and dry. No erythema.  Psychiatric: She has a normal mood and affect. Her behavior is normal.  Nursing note and vitals reviewed.    ED Treatments / Results  Labs (all labs ordered are listed, but only abnormal results are displayed) Labs Reviewed  COMPREHENSIVE METABOLIC PANEL - Abnormal; Notable for the following components:      Result Value   Glucose, Bld 118 (*)    BUN 22 (*)    All other components within normal limits  URINALYSIS, ROUTINE W REFLEX MICROSCOPIC - Abnormal; Notable for the following components:   Hgb urine dipstick TRACE (*)    Ketones, ur 15 (*)    Leukocytes, UA SMALL (*)    All other components within normal limits  URINALYSIS, MICROSCOPIC  (REFLEX) - Abnormal; Notable for the following components:   Bacteria, UA RARE (*)    Squamous Epithelial / LPF 0-5 (*)    All other components within normal limits  URINE CULTURE  LIPASE, BLOOD  CBC WITH DIFFERENTIAL/PLATELET    EKG  EKG Interpretation  Date/Time:  Thursday October 31 2017 11:51:08 EST Ventricular Rate:  67 PR Interval:    QRS Duration: 90 QT Interval:  415 QTC Calculation: 439 R Axis:   22 Text Interpretation:  Sinus rhythm Probable left atrial enlargement Baseline wander in lead(s) I II aVR V1 No significant change since last tracing Confirmed by Gareth Morgan [phone]) on 10/31/2017 1:13:50 PM       Radiology Dg Chest 2 View  Result Date: 10/31/2017 CLINICAL DATA:  Pt having mid back pain for a few days after moving her husband,x-smoker EXAM: CHEST  2 VIEW COMPARISON:  10/31/2016 FINDINGS: Heart size is accentuated by scoliosis and positioning. There is minimal left mid lung zone atelectasis or scarring. There are no focal consolidations or pleural effusions. No pulmonary edema. Significant S shaped scoliosis of the thoracolumbar spine. IMPRESSION: No active cardiopulmonary disease.  Scoliosis. Electronically Signed   By: Nolon Nations M.D.   On: 10/31/2017 11:48    Procedures Procedures (including critical care time)  Medications Ordered in ED Medications  sodium chloride 0.9 % bolus 500 mL (0 mLs Intravenous Stopped 10/31/17 1230)  morphine 2 MG/ML injection 2 mg (2 mg Intravenous Given 10/31/17 1149)  naproxen (NAPROSYN) tablet 500 mg (500 mg Oral Given 10/31/17 1426)     Initial Impression / Assessment and Plan / ED Course  I have reviewed the triage vital signs and the nursing notes.  Pertinent labs & imaging results that were available during my care of the patient were reviewed by me and considered in my medical decision making (see chart for details).     Patient with acute right-sided thoracic back pain reproducible on palpation.  She also  presents with mild diarrhea after eating salad and beef at her nursing home cafeteria.  Her husband has similar symptoms of diarrhea but does not have back pain.  She is afebrile, vital signs are stable.  Chest x-ray shows a prominent scoliosis but no acute cardiopulmonary abnormalities.  EKG shows sinus rhythm and no evidence of ST segment abnormality or arrhythmia.  I  doubt ACS or MI in the absence of chest pain.  Abdominal examination is unremarkable.  Lab work is reassuring with no significant electrolyte abnormalities or leukocytosis.  UA shows 6-30 WBCs but patient has no symptoms of UTI and no suprapubic tenderness.  Will culture.  I doubt obstruction, perforation, appendicitis, colitis, or other acute surgical abdominal pathology.  She appears well-hydrated.  She is ambulatory without difficulty.  Thoracic back pain appears to be musculoskeletal in nature.  No red flag signs concerning for cauda equina.  Pain has improved after administration of fluids, Naprosyn, and small amount of morphine.  She is stable for discharge home with follow-up with her primary care physician for reevaluation of her symptoms.  Discussed indications for return to the ED.  Patient and patient's family verbalized understanding of and agreement with plan and patient is stable for discharge home at this time. No complaints prior to discharge.  Final Clinical Impressions(s) / ED Diagnoses   Final diagnoses:  Acute right-sided thoracic back pain  Diarrhea in adult patient    ED Discharge Orders        Ordered    naproxen (NAPROSYN) 500 MG tablet  2 times daily with meals     10/31/17 1338       Renita Papa, PA-C 10/31/17 1709    Gareth Morgan, MD 10/31/17 2108

## 2017-10-31 NOTE — Discharge Instructions (Signed)
1. Medications: usual home medications, start taking naproxen twice daily with food.  Do not take ibuprofen, Aleve, or Advil on this medicine. 2. Treatment: rest, drink plenty of fluids, gentle stretching as discussed, alternate ice and heat, eat a diet of bland foods that will not upset your stomach. 3. Follow Up: Please followup with your primary doctor in 5-7 days for discussion of your diagnoses and further evaluation after today's visit; if you do not have a primary care doctor use the resource guide provided to find one;  Return to the ER for worsening back pain, difficulty walking, loss of bowel or bladder control or other concerning symptoms

## 2017-11-02 LAB — URINE CULTURE: Culture: <10000 — AB

## 2017-11-04 DIAGNOSIS — M545 Low back pain: Secondary | ICD-10-CM | POA: Diagnosis not present

## 2017-11-04 DIAGNOSIS — R41841 Cognitive communication deficit: Secondary | ICD-10-CM | POA: Diagnosis not present

## 2017-11-04 DIAGNOSIS — M79605 Pain in left leg: Secondary | ICD-10-CM | POA: Diagnosis not present

## 2017-11-04 DIAGNOSIS — R2681 Unsteadiness on feet: Secondary | ICD-10-CM | POA: Diagnosis not present

## 2017-11-04 DIAGNOSIS — M5416 Radiculopathy, lumbar region: Secondary | ICD-10-CM | POA: Diagnosis not present

## 2017-11-06 DIAGNOSIS — M79605 Pain in left leg: Secondary | ICD-10-CM | POA: Diagnosis not present

## 2017-11-06 DIAGNOSIS — M545 Low back pain: Secondary | ICD-10-CM | POA: Diagnosis not present

## 2017-11-06 DIAGNOSIS — R2681 Unsteadiness on feet: Secondary | ICD-10-CM | POA: Diagnosis not present

## 2017-11-06 DIAGNOSIS — M5416 Radiculopathy, lumbar region: Secondary | ICD-10-CM | POA: Diagnosis not present

## 2017-11-06 DIAGNOSIS — R41841 Cognitive communication deficit: Secondary | ICD-10-CM | POA: Diagnosis not present

## 2017-11-07 DIAGNOSIS — M5416 Radiculopathy, lumbar region: Secondary | ICD-10-CM | POA: Diagnosis not present

## 2017-11-07 DIAGNOSIS — R41841 Cognitive communication deficit: Secondary | ICD-10-CM | POA: Diagnosis not present

## 2017-11-07 DIAGNOSIS — M545 Low back pain: Secondary | ICD-10-CM | POA: Diagnosis not present

## 2017-11-07 DIAGNOSIS — M79605 Pain in left leg: Secondary | ICD-10-CM | POA: Diagnosis not present

## 2017-11-07 DIAGNOSIS — R2681 Unsteadiness on feet: Secondary | ICD-10-CM | POA: Diagnosis not present

## 2017-11-08 DIAGNOSIS — R2681 Unsteadiness on feet: Secondary | ICD-10-CM | POA: Diagnosis not present

## 2017-11-08 DIAGNOSIS — R41841 Cognitive communication deficit: Secondary | ICD-10-CM | POA: Diagnosis not present

## 2017-11-08 DIAGNOSIS — M79605 Pain in left leg: Secondary | ICD-10-CM | POA: Diagnosis not present

## 2017-11-08 DIAGNOSIS — M5416 Radiculopathy, lumbar region: Secondary | ICD-10-CM | POA: Diagnosis not present

## 2017-11-08 DIAGNOSIS — M545 Low back pain: Secondary | ICD-10-CM | POA: Diagnosis not present

## 2017-11-11 DIAGNOSIS — M5416 Radiculopathy, lumbar region: Secondary | ICD-10-CM | POA: Diagnosis not present

## 2017-11-11 DIAGNOSIS — M545 Low back pain: Secondary | ICD-10-CM | POA: Diagnosis not present

## 2017-11-11 DIAGNOSIS — R2681 Unsteadiness on feet: Secondary | ICD-10-CM | POA: Diagnosis not present

## 2017-11-11 DIAGNOSIS — R41841 Cognitive communication deficit: Secondary | ICD-10-CM | POA: Diagnosis not present

## 2017-11-11 DIAGNOSIS — M79605 Pain in left leg: Secondary | ICD-10-CM | POA: Diagnosis not present

## 2017-11-12 DIAGNOSIS — M79605 Pain in left leg: Secondary | ICD-10-CM | POA: Diagnosis not present

## 2017-11-12 DIAGNOSIS — M5416 Radiculopathy, lumbar region: Secondary | ICD-10-CM | POA: Diagnosis not present

## 2017-11-12 DIAGNOSIS — R41841 Cognitive communication deficit: Secondary | ICD-10-CM | POA: Diagnosis not present

## 2017-11-12 DIAGNOSIS — M545 Low back pain: Secondary | ICD-10-CM | POA: Diagnosis not present

## 2017-11-12 DIAGNOSIS — R2681 Unsteadiness on feet: Secondary | ICD-10-CM | POA: Diagnosis not present

## 2017-11-13 DIAGNOSIS — R41841 Cognitive communication deficit: Secondary | ICD-10-CM | POA: Diagnosis not present

## 2017-11-13 DIAGNOSIS — M79605 Pain in left leg: Secondary | ICD-10-CM | POA: Diagnosis not present

## 2017-11-13 DIAGNOSIS — R2681 Unsteadiness on feet: Secondary | ICD-10-CM | POA: Diagnosis not present

## 2017-11-13 DIAGNOSIS — M545 Low back pain: Secondary | ICD-10-CM | POA: Diagnosis not present

## 2017-11-13 DIAGNOSIS — M5416 Radiculopathy, lumbar region: Secondary | ICD-10-CM | POA: Diagnosis not present

## 2017-11-14 DIAGNOSIS — R41841 Cognitive communication deficit: Secondary | ICD-10-CM | POA: Diagnosis not present

## 2017-11-14 DIAGNOSIS — M5416 Radiculopathy, lumbar region: Secondary | ICD-10-CM | POA: Diagnosis not present

## 2017-11-14 DIAGNOSIS — M545 Low back pain: Secondary | ICD-10-CM | POA: Diagnosis not present

## 2017-11-14 DIAGNOSIS — M79605 Pain in left leg: Secondary | ICD-10-CM | POA: Diagnosis not present

## 2017-11-14 DIAGNOSIS — R2681 Unsteadiness on feet: Secondary | ICD-10-CM | POA: Diagnosis not present

## 2017-11-15 DIAGNOSIS — R2681 Unsteadiness on feet: Secondary | ICD-10-CM | POA: Diagnosis not present

## 2017-11-15 DIAGNOSIS — M79605 Pain in left leg: Secondary | ICD-10-CM | POA: Diagnosis not present

## 2017-11-15 DIAGNOSIS — M545 Low back pain: Secondary | ICD-10-CM | POA: Diagnosis not present

## 2017-11-15 DIAGNOSIS — M5416 Radiculopathy, lumbar region: Secondary | ICD-10-CM | POA: Diagnosis not present

## 2017-11-15 DIAGNOSIS — R41841 Cognitive communication deficit: Secondary | ICD-10-CM | POA: Diagnosis not present

## 2017-11-18 DIAGNOSIS — R41841 Cognitive communication deficit: Secondary | ICD-10-CM | POA: Diagnosis not present

## 2017-11-18 DIAGNOSIS — M5416 Radiculopathy, lumbar region: Secondary | ICD-10-CM | POA: Diagnosis not present

## 2017-11-18 DIAGNOSIS — M79605 Pain in left leg: Secondary | ICD-10-CM | POA: Diagnosis not present

## 2017-11-18 DIAGNOSIS — M545 Low back pain: Secondary | ICD-10-CM | POA: Diagnosis not present

## 2017-11-18 DIAGNOSIS — R2681 Unsteadiness on feet: Secondary | ICD-10-CM | POA: Diagnosis not present

## 2017-11-19 DIAGNOSIS — R2681 Unsteadiness on feet: Secondary | ICD-10-CM | POA: Diagnosis not present

## 2017-11-19 DIAGNOSIS — M79605 Pain in left leg: Secondary | ICD-10-CM | POA: Diagnosis not present

## 2017-11-19 DIAGNOSIS — M545 Low back pain: Secondary | ICD-10-CM | POA: Diagnosis not present

## 2017-11-19 DIAGNOSIS — R41841 Cognitive communication deficit: Secondary | ICD-10-CM | POA: Diagnosis not present

## 2017-11-19 DIAGNOSIS — M5416 Radiculopathy, lumbar region: Secondary | ICD-10-CM | POA: Diagnosis not present

## 2017-11-20 ENCOUNTER — Other Ambulatory Visit: Payer: Self-pay

## 2017-11-20 DIAGNOSIS — R41841 Cognitive communication deficit: Secondary | ICD-10-CM | POA: Diagnosis not present

## 2017-11-20 DIAGNOSIS — R2681 Unsteadiness on feet: Secondary | ICD-10-CM | POA: Diagnosis not present

## 2017-11-20 DIAGNOSIS — M545 Low back pain: Secondary | ICD-10-CM | POA: Diagnosis not present

## 2017-11-20 DIAGNOSIS — R197 Diarrhea, unspecified: Secondary | ICD-10-CM

## 2017-11-20 DIAGNOSIS — M79605 Pain in left leg: Secondary | ICD-10-CM | POA: Diagnosis not present

## 2017-11-20 DIAGNOSIS — M5416 Radiculopathy, lumbar region: Secondary | ICD-10-CM | POA: Diagnosis not present

## 2017-11-27 DIAGNOSIS — M545 Low back pain: Secondary | ICD-10-CM | POA: Diagnosis not present

## 2017-11-27 DIAGNOSIS — M5416 Radiculopathy, lumbar region: Secondary | ICD-10-CM | POA: Diagnosis not present

## 2017-11-27 DIAGNOSIS — R2681 Unsteadiness on feet: Secondary | ICD-10-CM | POA: Diagnosis not present

## 2017-11-27 DIAGNOSIS — R41841 Cognitive communication deficit: Secondary | ICD-10-CM | POA: Diagnosis not present

## 2017-11-27 DIAGNOSIS — M79605 Pain in left leg: Secondary | ICD-10-CM | POA: Diagnosis not present

## 2017-12-02 DIAGNOSIS — M545 Low back pain: Secondary | ICD-10-CM | POA: Diagnosis not present

## 2017-12-02 DIAGNOSIS — M5416 Radiculopathy, lumbar region: Secondary | ICD-10-CM | POA: Diagnosis not present

## 2017-12-02 DIAGNOSIS — M79605 Pain in left leg: Secondary | ICD-10-CM | POA: Diagnosis not present

## 2017-12-02 DIAGNOSIS — R2681 Unsteadiness on feet: Secondary | ICD-10-CM | POA: Diagnosis not present

## 2017-12-02 DIAGNOSIS — R41841 Cognitive communication deficit: Secondary | ICD-10-CM | POA: Diagnosis not present

## 2017-12-03 DIAGNOSIS — R2681 Unsteadiness on feet: Secondary | ICD-10-CM | POA: Diagnosis not present

## 2017-12-03 DIAGNOSIS — R41841 Cognitive communication deficit: Secondary | ICD-10-CM | POA: Diagnosis not present

## 2017-12-03 DIAGNOSIS — M545 Low back pain: Secondary | ICD-10-CM | POA: Diagnosis not present

## 2017-12-03 DIAGNOSIS — M79605 Pain in left leg: Secondary | ICD-10-CM | POA: Diagnosis not present

## 2017-12-03 DIAGNOSIS — M5416 Radiculopathy, lumbar region: Secondary | ICD-10-CM | POA: Diagnosis not present

## 2017-12-04 DIAGNOSIS — M5416 Radiculopathy, lumbar region: Secondary | ICD-10-CM | POA: Diagnosis not present

## 2017-12-04 DIAGNOSIS — M79605 Pain in left leg: Secondary | ICD-10-CM | POA: Diagnosis not present

## 2017-12-04 DIAGNOSIS — R41841 Cognitive communication deficit: Secondary | ICD-10-CM | POA: Diagnosis not present

## 2017-12-04 DIAGNOSIS — R2681 Unsteadiness on feet: Secondary | ICD-10-CM | POA: Diagnosis not present

## 2017-12-04 DIAGNOSIS — M545 Low back pain: Secondary | ICD-10-CM | POA: Diagnosis not present

## 2017-12-05 DIAGNOSIS — M5416 Radiculopathy, lumbar region: Secondary | ICD-10-CM | POA: Diagnosis not present

## 2017-12-05 DIAGNOSIS — M79605 Pain in left leg: Secondary | ICD-10-CM | POA: Diagnosis not present

## 2017-12-05 DIAGNOSIS — R41841 Cognitive communication deficit: Secondary | ICD-10-CM | POA: Diagnosis not present

## 2017-12-05 DIAGNOSIS — M545 Low back pain: Secondary | ICD-10-CM | POA: Diagnosis not present

## 2017-12-05 DIAGNOSIS — R2681 Unsteadiness on feet: Secondary | ICD-10-CM | POA: Diagnosis not present

## 2017-12-06 DIAGNOSIS — R41841 Cognitive communication deficit: Secondary | ICD-10-CM | POA: Diagnosis not present

## 2017-12-06 DIAGNOSIS — R2681 Unsteadiness on feet: Secondary | ICD-10-CM | POA: Diagnosis not present

## 2017-12-06 DIAGNOSIS — M545 Low back pain: Secondary | ICD-10-CM | POA: Diagnosis not present

## 2017-12-06 DIAGNOSIS — M5416 Radiculopathy, lumbar region: Secondary | ICD-10-CM | POA: Diagnosis not present

## 2017-12-06 DIAGNOSIS — M79605 Pain in left leg: Secondary | ICD-10-CM | POA: Diagnosis not present

## 2017-12-09 DIAGNOSIS — M79605 Pain in left leg: Secondary | ICD-10-CM | POA: Diagnosis not present

## 2017-12-09 DIAGNOSIS — M5416 Radiculopathy, lumbar region: Secondary | ICD-10-CM | POA: Diagnosis not present

## 2017-12-09 DIAGNOSIS — R2681 Unsteadiness on feet: Secondary | ICD-10-CM | POA: Diagnosis not present

## 2017-12-09 DIAGNOSIS — M545 Low back pain: Secondary | ICD-10-CM | POA: Diagnosis not present

## 2017-12-09 DIAGNOSIS — R41841 Cognitive communication deficit: Secondary | ICD-10-CM | POA: Diagnosis not present

## 2017-12-11 DIAGNOSIS — M545 Low back pain: Secondary | ICD-10-CM | POA: Diagnosis not present

## 2017-12-11 DIAGNOSIS — R41841 Cognitive communication deficit: Secondary | ICD-10-CM | POA: Diagnosis not present

## 2017-12-11 DIAGNOSIS — R2681 Unsteadiness on feet: Secondary | ICD-10-CM | POA: Diagnosis not present

## 2017-12-11 DIAGNOSIS — M79605 Pain in left leg: Secondary | ICD-10-CM | POA: Diagnosis not present

## 2017-12-11 DIAGNOSIS — M5416 Radiculopathy, lumbar region: Secondary | ICD-10-CM | POA: Diagnosis not present

## 2017-12-16 DIAGNOSIS — M545 Low back pain: Secondary | ICD-10-CM | POA: Diagnosis not present

## 2017-12-16 DIAGNOSIS — M79605 Pain in left leg: Secondary | ICD-10-CM | POA: Diagnosis not present

## 2017-12-16 DIAGNOSIS — M5416 Radiculopathy, lumbar region: Secondary | ICD-10-CM | POA: Diagnosis not present

## 2017-12-16 DIAGNOSIS — R41841 Cognitive communication deficit: Secondary | ICD-10-CM | POA: Diagnosis not present

## 2017-12-16 DIAGNOSIS — R2681 Unsteadiness on feet: Secondary | ICD-10-CM | POA: Diagnosis not present

## 2017-12-17 DIAGNOSIS — R2681 Unsteadiness on feet: Secondary | ICD-10-CM | POA: Diagnosis not present

## 2017-12-17 DIAGNOSIS — M79605 Pain in left leg: Secondary | ICD-10-CM | POA: Diagnosis not present

## 2017-12-17 DIAGNOSIS — R41841 Cognitive communication deficit: Secondary | ICD-10-CM | POA: Diagnosis not present

## 2017-12-17 DIAGNOSIS — M545 Low back pain: Secondary | ICD-10-CM | POA: Diagnosis not present

## 2017-12-17 DIAGNOSIS — M5416 Radiculopathy, lumbar region: Secondary | ICD-10-CM | POA: Diagnosis not present

## 2017-12-20 DIAGNOSIS — R2681 Unsteadiness on feet: Secondary | ICD-10-CM | POA: Diagnosis not present

## 2017-12-20 DIAGNOSIS — M5416 Radiculopathy, lumbar region: Secondary | ICD-10-CM | POA: Diagnosis not present

## 2017-12-20 DIAGNOSIS — M545 Low back pain: Secondary | ICD-10-CM | POA: Diagnosis not present

## 2017-12-20 DIAGNOSIS — M79605 Pain in left leg: Secondary | ICD-10-CM | POA: Diagnosis not present

## 2017-12-20 DIAGNOSIS — R41841 Cognitive communication deficit: Secondary | ICD-10-CM | POA: Diagnosis not present

## 2018-01-06 ENCOUNTER — Telehealth: Payer: Self-pay | Admitting: *Deleted

## 2018-01-06 NOTE — Telephone Encounter (Addendum)
Received [employer] Pension benefits paperwork to be completed and forwarded back to [facility] of [facility] [to care for spouse]; forwarded to provider/SLS 04/08

## 2018-01-06 NOTE — Telephone Encounter (Signed)
Received Physician Orders from [facility]; forwarded to provider/SLS 04/08

## 2018-01-07 ENCOUNTER — Telehealth: Payer: Self-pay | Admitting: *Deleted

## 2018-01-07 NOTE — Telephone Encounter (Signed)
Received Physician Orders from [facility]; forwarded to provider/SLS 04/09

## 2018-01-09 DIAGNOSIS — R278 Other lack of coordination: Secondary | ICD-10-CM | POA: Diagnosis not present

## 2018-01-09 DIAGNOSIS — R41841 Cognitive communication deficit: Secondary | ICD-10-CM | POA: Diagnosis not present

## 2018-01-09 DIAGNOSIS — R2681 Unsteadiness on feet: Secondary | ICD-10-CM | POA: Diagnosis not present

## 2018-01-09 DIAGNOSIS — M25552 Pain in left hip: Secondary | ICD-10-CM | POA: Diagnosis not present

## 2018-01-09 DIAGNOSIS — M6281 Muscle weakness (generalized): Secondary | ICD-10-CM | POA: Diagnosis not present

## 2018-01-10 DIAGNOSIS — M25552 Pain in left hip: Secondary | ICD-10-CM | POA: Diagnosis not present

## 2018-01-10 DIAGNOSIS — R2681 Unsteadiness on feet: Secondary | ICD-10-CM | POA: Diagnosis not present

## 2018-01-10 DIAGNOSIS — R278 Other lack of coordination: Secondary | ICD-10-CM | POA: Diagnosis not present

## 2018-01-10 DIAGNOSIS — R41841 Cognitive communication deficit: Secondary | ICD-10-CM | POA: Diagnosis not present

## 2018-01-10 DIAGNOSIS — M6281 Muscle weakness (generalized): Secondary | ICD-10-CM | POA: Diagnosis not present

## 2018-01-13 DIAGNOSIS — M6281 Muscle weakness (generalized): Secondary | ICD-10-CM | POA: Diagnosis not present

## 2018-01-13 DIAGNOSIS — M25552 Pain in left hip: Secondary | ICD-10-CM | POA: Diagnosis not present

## 2018-01-13 DIAGNOSIS — R2681 Unsteadiness on feet: Secondary | ICD-10-CM | POA: Diagnosis not present

## 2018-01-13 DIAGNOSIS — R278 Other lack of coordination: Secondary | ICD-10-CM | POA: Diagnosis not present

## 2018-01-13 DIAGNOSIS — R41841 Cognitive communication deficit: Secondary | ICD-10-CM | POA: Diagnosis not present

## 2018-01-14 DIAGNOSIS — R41841 Cognitive communication deficit: Secondary | ICD-10-CM | POA: Diagnosis not present

## 2018-01-14 DIAGNOSIS — M6281 Muscle weakness (generalized): Secondary | ICD-10-CM | POA: Diagnosis not present

## 2018-01-14 DIAGNOSIS — R2681 Unsteadiness on feet: Secondary | ICD-10-CM | POA: Diagnosis not present

## 2018-01-14 DIAGNOSIS — M25552 Pain in left hip: Secondary | ICD-10-CM | POA: Diagnosis not present

## 2018-01-14 DIAGNOSIS — R278 Other lack of coordination: Secondary | ICD-10-CM | POA: Diagnosis not present

## 2018-01-15 ENCOUNTER — Telehealth: Payer: Self-pay | Admitting: *Deleted

## 2018-01-15 NOTE — Telephone Encounter (Signed)
Received Physician Orders/PT Plan of Care from [facility]; forwarded to provider/SLS 04/17

## 2018-01-16 ENCOUNTER — Telehealth: Payer: Self-pay | Admitting: *Deleted

## 2018-01-16 DIAGNOSIS — M25552 Pain in left hip: Secondary | ICD-10-CM | POA: Diagnosis not present

## 2018-01-16 DIAGNOSIS — R278 Other lack of coordination: Secondary | ICD-10-CM | POA: Diagnosis not present

## 2018-01-16 DIAGNOSIS — R2681 Unsteadiness on feet: Secondary | ICD-10-CM | POA: Diagnosis not present

## 2018-01-16 DIAGNOSIS — M6281 Muscle weakness (generalized): Secondary | ICD-10-CM | POA: Diagnosis not present

## 2018-01-16 DIAGNOSIS — R41841 Cognitive communication deficit: Secondary | ICD-10-CM | POA: Diagnosis not present

## 2018-01-16 NOTE — Telephone Encounter (Signed)
Received Physician Orders/OT Plan of Care from [facility]; forwarded to provider/SLS 04/18

## 2018-01-17 DIAGNOSIS — M6281 Muscle weakness (generalized): Secondary | ICD-10-CM | POA: Diagnosis not present

## 2018-01-17 DIAGNOSIS — M25552 Pain in left hip: Secondary | ICD-10-CM | POA: Diagnosis not present

## 2018-01-17 DIAGNOSIS — R2681 Unsteadiness on feet: Secondary | ICD-10-CM | POA: Diagnosis not present

## 2018-01-17 DIAGNOSIS — R278 Other lack of coordination: Secondary | ICD-10-CM | POA: Diagnosis not present

## 2018-01-17 DIAGNOSIS — R41841 Cognitive communication deficit: Secondary | ICD-10-CM | POA: Diagnosis not present

## 2018-01-20 ENCOUNTER — Ambulatory Visit: Payer: Self-pay

## 2018-01-20 NOTE — Telephone Encounter (Signed)
FYI to PCP
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
## 2018-01-24 DIAGNOSIS — M6281 Muscle weakness (generalized): Secondary | ICD-10-CM | POA: Diagnosis not present

## 2018-01-24 DIAGNOSIS — R278 Other lack of coordination: Secondary | ICD-10-CM | POA: Diagnosis not present

## 2018-01-24 DIAGNOSIS — R41841 Cognitive communication deficit: Secondary | ICD-10-CM | POA: Diagnosis not present

## 2018-01-24 DIAGNOSIS — M25552 Pain in left hip: Secondary | ICD-10-CM | POA: Diagnosis not present

## 2018-01-24 DIAGNOSIS — R2681 Unsteadiness on feet: Secondary | ICD-10-CM | POA: Diagnosis not present

## 2018-01-27 DIAGNOSIS — R2681 Unsteadiness on feet: Secondary | ICD-10-CM | POA: Diagnosis not present

## 2018-01-27 DIAGNOSIS — R41841 Cognitive communication deficit: Secondary | ICD-10-CM | POA: Diagnosis not present

## 2018-01-27 DIAGNOSIS — M6281 Muscle weakness (generalized): Secondary | ICD-10-CM | POA: Diagnosis not present

## 2018-01-27 DIAGNOSIS — M25552 Pain in left hip: Secondary | ICD-10-CM | POA: Diagnosis not present

## 2018-01-27 DIAGNOSIS — R278 Other lack of coordination: Secondary | ICD-10-CM | POA: Diagnosis not present

## 2018-01-28 DIAGNOSIS — R41841 Cognitive communication deficit: Secondary | ICD-10-CM | POA: Diagnosis not present

## 2018-01-28 DIAGNOSIS — R2681 Unsteadiness on feet: Secondary | ICD-10-CM | POA: Diagnosis not present

## 2018-01-28 DIAGNOSIS — R278 Other lack of coordination: Secondary | ICD-10-CM | POA: Diagnosis not present

## 2018-01-28 DIAGNOSIS — M25552 Pain in left hip: Secondary | ICD-10-CM | POA: Diagnosis not present

## 2018-01-28 DIAGNOSIS — M6281 Muscle weakness (generalized): Secondary | ICD-10-CM | POA: Diagnosis not present

## 2018-01-29 ENCOUNTER — Encounter: Payer: Self-pay | Admitting: Family Medicine

## 2018-01-29 DIAGNOSIS — R41841 Cognitive communication deficit: Secondary | ICD-10-CM | POA: Diagnosis not present

## 2018-01-29 DIAGNOSIS — M6281 Muscle weakness (generalized): Secondary | ICD-10-CM | POA: Diagnosis not present

## 2018-01-29 DIAGNOSIS — M25552 Pain in left hip: Secondary | ICD-10-CM | POA: Diagnosis not present

## 2018-01-29 DIAGNOSIS — R278 Other lack of coordination: Secondary | ICD-10-CM | POA: Diagnosis not present

## 2018-01-29 DIAGNOSIS — R2681 Unsteadiness on feet: Secondary | ICD-10-CM | POA: Diagnosis not present

## 2018-01-30 DIAGNOSIS — R278 Other lack of coordination: Secondary | ICD-10-CM | POA: Diagnosis not present

## 2018-01-30 DIAGNOSIS — M6281 Muscle weakness (generalized): Secondary | ICD-10-CM | POA: Diagnosis not present

## 2018-01-30 DIAGNOSIS — R41841 Cognitive communication deficit: Secondary | ICD-10-CM | POA: Diagnosis not present

## 2018-01-30 DIAGNOSIS — M25552 Pain in left hip: Secondary | ICD-10-CM | POA: Diagnosis not present

## 2018-01-30 DIAGNOSIS — R2681 Unsteadiness on feet: Secondary | ICD-10-CM | POA: Diagnosis not present

## 2018-01-31 ENCOUNTER — Telehealth: Payer: Self-pay | Admitting: *Deleted

## 2018-01-31 ENCOUNTER — Ambulatory Visit (INDEPENDENT_AMBULATORY_CARE_PROVIDER_SITE_OTHER): Payer: Medicare Other | Admitting: Family Medicine

## 2018-01-31 ENCOUNTER — Encounter: Payer: Self-pay | Admitting: Family Medicine

## 2018-01-31 VITALS — BP 116/70 | HR 73 | Temp 97.6°F | Ht 60.0 in | Wt 128.5 lb

## 2018-01-31 DIAGNOSIS — F0390 Unspecified dementia without behavioral disturbance: Secondary | ICD-10-CM

## 2018-01-31 DIAGNOSIS — E119 Type 2 diabetes mellitus without complications: Secondary | ICD-10-CM

## 2018-01-31 DIAGNOSIS — Z794 Long term (current) use of insulin: Secondary | ICD-10-CM

## 2018-01-31 DIAGNOSIS — F03B0 Unspecified dementia, moderate, without behavioral disturbance, psychotic disturbance, mood disturbance, and anxiety: Secondary | ICD-10-CM

## 2018-01-31 NOTE — Progress Notes (Signed)
Subjective:    Patient ID: Jaime Mooney, female    DOB: March 22, 1929, 83 y.o.   MRN: [id number]  Chief Complaint  Patient presents with  . Establish Care    previously seen by Dr Lorelei Pont and now requests change due to transportation  Patient is accompanied by her husband(John) and her son.  This provider has received multiple emails patient's daughter "[name]" regarding patient's decline in memory.  HPI Pt is an 84 yo with pmh sig for NSTEMI, DM II, scoliosis, and dementia seen today for follow-up.  Pt was previously seen by Dr. Edilia Bo in [facility] however 2/2 transportation she is having to reestablish with an office that is closer.  Patient is currently living in [facility] with her husband Jaime Mooney.  Memory concerns: -Pt has a history of dementia x 9 months. -not on medication as she refuses to take it. -Family members endorse declining cognition-difficulty verbalizing words, names, and completing her thoughts..  Pt denies this. -During her husband's recent hospitalization, pt became confused when staying with him and was noted wondering into another pt's room.  Pt's daughter reports she was confused and did not understand why she was in the hospital. -Pt has also started to hide things from her husband's caregivers such as the foot rest for his wheelchair.  Pt has also unplugged her husband's recliner and pushed it against the wall, so he cannot sleep in it though it is more comfortable for him.  Diabetes: -Last hemoglobin A1c was 7.0% on 08/29/2017 -Patient is not taking any medications for this currently. -Patient refuses to take medications.   Past Medical History:  Diagnosis Date  . Allergy   . Arthritis   . Diabetes mellitus type 2, uncomplicated ([place]) 0/27/2536  . Gallstones 2010  . GERD (gastroesophageal reflux disease)   . HLD (hyperlipidemia)   . HTN (hypertension)   . NSTEMI (non-ST elevated myocardial infarction) ([place])   . PVC (premature ventricular  contraction)     Allergies  Allergen Reactions  . Acetaminophen Swelling  . Epinephrine     Causes heart palpatations  . Lisinopril     Facial swelling and tongue swelling  . Norvasc [Amlodipine Besylate] Swelling  . Prednisone Swelling    Facial swelling, can take in small doses    ROS General: Denies fever, chills, night sweats, changes in weight, changes in appetite  +dementia HEENT: Denies headaches, ear pain, changes in vision, rhinorrhea, sore throat CV: Denies CP, palpitations, SOB, orthopnea Pulm: Denies SOB, cough, wheezing GI: Denies abdominal pain, nausea, vomiting, diarrhea, constipation GU: Denies dysuria, hematuria, frequency, vaginal discharge Msk: Denies muscle cramps, joint pains Neuro: Denies weakness, numbness, tingling  + patient stutters when upset/anxious Skin: Denies rashes, bruising Psych: Denies depression, anxiety, hallucinations     Objective:    Blood pressure 116/70, pulse 73, temperature 97.6 F (36.4 C), temperature source Oral, height 5' (1.524 m), weight 128 lb 8 oz (58.3 kg).   Gen. Pleasant, well-nourished, in no distress, normal affect   HEENT: [place]/AT, face symmetric, no scleral icterus, PERRLA, nares patent without drainage, pharynx without erythema or exudate. Lungs: no accessory muscle use, CTAB, no wheezes or rales Cardiovascular: RRR, no m/r/g, no peripheral edema Abdomen: BS present, soft, NT/ND Neuro:  A&Ox3, CN II-XII intact, normal gait, leans forward 2/2 scoliosis.  Attempted MME however unable to complete 2/2 patient cooperation/frustration.  Patient was able to name the city, county, state, president, date, year, spell the word world forward and backward, a few animals, and  4words beginning with the letter F. Skin:  Warm, no lesions/ rash   Wt Readings from Last 3 Encounters:  01/31/18 128 lb 8 oz (58.3 kg)  10/17/17 132 lb 9.6 oz (60.1 kg)  08/29/17 132 lb 6.4 oz (60.1 kg)    Lab Results  Component Value Date   WBC 7.9  10/31/2017   HGB 13.5 10/31/2017   HCT 41.6 10/31/2017   PLT 190 10/31/2017   GLUCOSE 118 (H) 10/31/2017   CHOL 241 (H) 11/01/2016   TRIG 151 (H) 11/01/2016   HDL 50 11/01/2016   LDLDIRECT 158.0 02/13/2016   LDLCALC 161 (H) 11/01/2016   ALT 18 10/31/2017   AST 20 10/31/2017   NA 139 10/31/2017   K 3.6 10/31/2017   CL 104 10/31/2017   CREATININE 0.50 10/31/2017   BUN 22 (H) 10/31/2017   CO2 27 10/31/2017   TSH 0.932 12/02/2013   INR 1.10 11/02/2016   HGBA1C 7.0 (H) 08/29/2017    Assessment/Plan:  Type 2 diabetes mellitus without complication, with long-term current use of insulin ([place]) -In next OFV will recheck hemoglobin A1c -Patient advised to continue lifestyle modifications including decreasing the amount of carbohydrates and sweets she eats as she does not wish to take medication  Moderate dementia without behavioral disturbance -Discussed various treatment options including medications.  Patient is not interested in this. -Discussed neuropsych testing however patient refuses -Patient's daughter, Scherrie Bateman has HCPOA  Follow-up PRN  Grier Mitts, MD

## 2018-01-31 NOTE — Patient Instructions (Signed)
Dementia Caregiver Guide Dementia is a term used to describe a number of symptoms that affect memory and thinking. The most common symptoms include:  Memory loss.  Trouble with language and communication.  Trouble concentrating.  Poor judgment.  Problems with reasoning.  Child-like behavior and language.  Extreme anxiety.  Angry outbursts.  Wandering from home or public places.  Dementia usually gets worse slowly over time. In the early stages, people with dementia can stay independent and safe with some help. In later stages, they need help with daily tasks such as dressing, grooming, and using the bathroom. How to help the person with dementia cope Dementia can be frightening and confusing. Here are some tips to help the person with dementia cope with changes caused by the disease. General tips  Keep the person on track with his or her routine.  Try to identify areas where the person may need help.  Be supportive, patient, calm, and encouraging.  Gently remind the person that adjusting to changes takes time.  Help with the tasks that the person has asked for help with.  Keep the person involved in daily tasks and decisions as much as possible.  Encourage conversation, but try not to get frustrated or harried if the person struggles to find words or does not seem to appreciate your help. Communication tips  When the person is talking or seems frustrated, make eye contact and hold the person's hand.  Ask specific questions that need yes or no answers.  Use simple words, short sentences, and a calm voice. Only give one direction at a time.  When offering choices, limit them to just 1 or 2.  Avoid correcting the person in a negative way.  If the person is struggling to find the right words, gently try to help him or her. How to recognize symptoms of stress Symptoms of stress in caregivers include:  Feeling frustrated or angry with the person with  dementia.  Denying that the person has dementia or that his or her symptoms will not improve.  Feeling hopeless and unappreciated.  Difficulty sleeping.  Difficulty concentrating.  Feeling anxious, irritable, or depressed.  Developing stress-related health problems.  Feeling like you have too little time for your own life.  Follow these instructions at home:  Make sure that you and the person you are caring for: ? Get regular sleep. ? Exercise regularly. ? Eat regular, nutritious meals. ? Drink enough fluid to keep your urine clear or pale yellow. ? Take over-the-counter and prescription medicines only as told by your health care providers. ? Attend all scheduled health care appointments.  Join a support group with others who are caregivers.  Ask about respite care resources so that you can have a regular break from the stress of caregiving.  Look for signs of stress in yourself and in the person you are caring for. If you notice signs of stress, take steps to manage it.  Consider any safety risks and take steps to avoid them.  Organize medications in a pill box for each day of the week.  Create a plan to handle any legal or financial matters. Get legal or financial advice if needed.  Keep a calendar in a central location to remind the person of appointments or other activities. Tips for reducing the risk of injury  Keep floors clear of clutter. Remove rugs, magazine racks, and floor lamps.  Keep hallways well lit, especially at night.  Put a handrail and nonslip mat in the bathtub   or shower.  Put childproof locks on cabinets that contain dangerous items, such as medicines, alcohol, guns, toxic cleaning items, sharp tools or utensils, matches, and lighters.  Put the locks in places where the person cannot see or reach them easily. This will help ensure that the person does not wander out of the house and get lost.  Be prepared for emergencies. Keep a list of  emergency phone numbers and addresses in a convenient area.  Remove car keys and lock garage doors so that the person does not try to get in the car and drive.  Have the person wear a bracelet that tracks locations and identifies the person as having memory problems. This should be worn at all times for safety. Where to find support: Many individuals and organizations offer support. These include:  Support groups for people with dementia and for caregivers.  Counselors or therapists.  Home health care services.  Adult day care centers.  Where to find more information: Alzheimer's Association: [employer] Contact a health care provider if:  The person's health is rapidly getting worse.  You are no longer able to care for the person.  Caring for the person is affecting your physical and emotional health.  The person threatens himself or herself, you, or anyone else. Summary  Dementia is a term used to describe a number of symptoms that affect memory and thinking.  Dementia usually gets worse slowly over time.  Take steps to reduce the person's risk of injury, and to plan for future care.  Caregivers need support, relief from caregiving, and time for their own lives. This information is not intended to replace advice given to you by your health care provider. Make sure you discuss any questions you have with your health care provider. Document Released: 08/21/2016 Document Revised: 08/21/2016 Document Reviewed: 08/21/2016 Elsevier Interactive Patient Education  2018 [employer].  Dementia Dementia is the loss of two or more brain functions, such as:  Memory.  Decision making.  Behavior.  Speaking.  Thinking.  Problem solving.  There are many types of dementia. The most common type is called progressive dementia. Progressive dementia gets worse with time and it is irreversible. An example of this type of dementia is Alzheimer disease. What are the causes? This  condition may be caused by:  Nerve cell damage in the brain.  Genetic mutations.  Certain medicines.  Multiple small strokes.  An infection, such as chronic meningitis.  A metabolic problem, such as vitamin B12 deficiency or thyroid disease.  Pressure on the brain, such as from a tumor or blood clot.  What are the signs or symptoms? Symptoms of this condition include:  Sudden changes in mood.  Depression.  Problems with balance.  Changes in personality.  Poor short-term memory.  Agitation.  Delusions.  Hallucinations.  Having a hard time: ? Speaking thoughts. ? Finding words. ? Solving problems. ? Doing familiar tasks. ? Understanding familiar ideas.  How is this diagnosed? This condition is diagnosed with an assessment by your health care provider. During this assessment, your health care provider will talk with you and your family, friends, or caregivers about your symptoms. A thorough medical history will be taken, and you will have a physical exam and tests. Tests may include:  Lab tests, such as blood or urine tests.  Imaging tests, such as a CT scan, PET scan, or MRI.  A lumbar puncture. This test involves removing and testing a small amount of the fluid that surrounds the brain  and spinal cord.  An electroencephalogram (EEG). In this test, small metal discs are used to measure electrical activity in the brain.  Memory tests, cognitive tests, and neuropsychological tests. These tests evaluate brain function.  How is this treated? Treatment depends on the cause of the dementia. It may involve taking medicines that may help:  To control the dementia.  To slow down the disease.  To manage symptoms.  In some cases, treating the cause of the dementia can improve symptoms, reverse symptoms, or slow down how quickly the dementia gets worse. Your health care provider can help direct you to support groups, organizations, and other health care providers  who can help with decisions about your care. Follow these instructions at home: Medicine  Take over-the-counter and prescription medicines only as told by your health care provider.  Avoid taking medicines that can affect thinking, such as pain or sleeping medicines. Lifestyle   Make healthy lifestyle choices: ? Be physically active as told by your health care provider. ? Do not use any tobacco products, such as cigarettes, chewing tobacco, and e-cigarettes. If you need help quitting, ask your health care provider. ? Eat a healthy diet. ? Practice stress-management techniques when you get stressed. ? Stay social.  Drink enough fluid to keep your urine clear or pale yellow.  Make sure to get quality sleep. These tips can help you to get a good night's rest: ? Avoid napping during the day. ? Keep your sleeping area dark and cool. ? Avoid exercising during the few hours before you go to bed. ? Avoid caffeine products in the evening. General instructions  Work with your health care provider to determine what you need help with and what your safety needs are.  If you were given a bracelet that tracks your location, make sure to wear it.  Keep all follow-up visits as told by your health care provider. This is important. Contact a health care provider if:  You have any new symptoms.  You have problems with choking or swallowing.  You have any symptoms of a different illness. Get help right away if:  You develop a fever.  You have new or worsening confusion.  You have new or worsening sleepiness.  You have a hard time staying awake.  You or your family members become concerned for your safety. This information is not intended to replace advice given to you by your health care provider. Make sure you discuss any questions you have with your health care provider. Document Released: 03/13/2001 Document Revised: 01/26/2016 Document Reviewed: 06/15/2015 Elsevier Interactive  Patient Education  2018 [employer].  Diabetes Mellitus and Nutrition When you have diabetes (diabetes mellitus), it is very important to have healthy eating habits because your blood sugar (glucose) levels are greatly affected by what you eat and drink. Eating healthy foods in the appropriate amounts, at about the same times every day, can help you:  Control your blood glucose.  Lower your risk of heart disease.  Improve your blood pressure.  Reach or maintain a healthy weight.  Every person with diabetes is different, and each person has different needs for a meal plan. Your health care provider may recommend that you work with a diet and nutrition specialist (dietitian) to make a meal plan that is best for you. Your meal plan may vary depending on factors such as:  The calories you need.  The medicines you take.  Your weight.  Your blood glucose, blood pressure, and cholesterol levels.  Your activity level.  Other health conditions you have, such as heart or kidney disease.  How do carbohydrates affect me? Carbohydrates affect your blood glucose level more than any other type of food. Eating carbohydrates naturally increases the amount of glucose in your blood. Carbohydrate counting is a method for keeping track of how many carbohydrates you eat. Counting carbohydrates is important to keep your blood glucose at a healthy level, especially if you use insulin or take certain oral diabetes medicines. It is important to know how many carbohydrates you can safely have in each meal. This is different for every person. Your dietitian can help you calculate how many carbohydrates you should have at each meal and for snack. Foods that contain carbohydrates include:  Bread, cereal, rice, pasta, and crackers.  Potatoes and corn.  Peas, beans, and lentils.  Milk and yogurt.  Fruit and juice.  Desserts, such as cakes, cookies, ice cream, and candy.  How does alcohol affect  me? Alcohol can cause a sudden decrease in blood glucose (hypoglycemia), especially if you use insulin or take certain oral diabetes medicines. Hypoglycemia can be a life-threatening condition. Symptoms of hypoglycemia (sleepiness, dizziness, and confusion) are similar to symptoms of having too much alcohol. If your health care provider says that alcohol is safe for you, follow these guidelines:  Limit alcohol intake to no more than 1 drink per day for nonpregnant women and 2 drinks per day for men. One drink equals 12 oz of beer, 5 oz of wine, or 1 oz of hard liquor.  Do not drink on an empty stomach.  Keep yourself hydrated with water, diet soda, or unsweetened iced tea.  Keep in mind that regular soda, juice, and other mixers may contain a lot of sugar and must be counted as carbohydrates.  What are tips for following this plan? Reading food labels  Start by checking the serving size on the label. The amount of calories, carbohydrates, fats, and other nutrients listed on the label are based on one serving of the food. Many foods contain more than one serving per package.  Check the total grams (g) of carbohydrates in one serving. You can calculate the number of servings of carbohydrates in one serving by dividing the total carbohydrates by 15. For example, if a food has 30 g of total carbohydrates, it would be equal to 2 servings of carbohydrates.  Check the number of grams (g) of saturated and trans fats in one serving. Choose foods that have low or no amount of these fats.  Check the number of milligrams (mg) of sodium in one serving. Most people should limit total sodium intake to less than 2,300 mg per day.  Always check the nutrition information of foods labeled as "low-fat" or "nonfat". These foods may be higher in added sugar or refined carbohydrates and should be avoided.  Talk to your dietitian to identify your daily goals for nutrients listed on the label. Shopping  Avoid  buying canned, premade, or processed foods. These foods tend to be high in fat, sodium, and added sugar.  Shop around the outside edge of the grocery store. This includes fresh fruits and vegetables, bulk grains, fresh meats, and fresh dairy. Cooking  Use low-heat cooking methods, such as baking, instead of high-heat cooking methods like deep frying.  Cook using healthy oils, such as olive, canola, or sunflower oil.  Avoid cooking with butter, cream, or high-fat meats. Meal planning  Eat meals and snacks regularly, preferably at the same  times every day. Avoid going long periods of time without eating.  Eat foods high in fiber, such as fresh fruits, vegetables, beans, and whole grains. Talk to your dietitian about how many servings of carbohydrates you can eat at each meal.  Eat 4-6 ounces of lean protein each day, such as lean meat, chicken, fish, eggs, or tofu. 1 ounce is equal to 1 ounce of meat, chicken, or fish, 1 egg, or 1/4 cup of tofu.  Eat some foods each day that contain healthy fats, such as avocado, nuts, seeds, and fish. Lifestyle   Check your blood glucose regularly.  Exercise at least 30 minutes 5 or more days each week, or as told by your health care provider.  Take medicines as told by your health care provider.  Do not use any products that contain nicotine or tobacco, such as cigarettes and e-cigarettes. If you need help quitting, ask your health care provider.  Work with a Social worker or diabetes educator to identify strategies to manage stress and any emotional and social challenges. What are some questions to ask my health care provider?  Do I need to meet with a diabetes educator?  Do I need to meet with a dietitian?  What number can I call if I have questions?  When are the best times to check my blood glucose? Where to find more information:  American Diabetes Association: diabetes.org/food-and-fitness/food  Academy of Nutrition and Dietetics:  PokerClues.dk  [employer] of Diabetes and Digestive and Kidney Diseases (NIH): ContactWire.be Summary  A healthy meal plan will help you control your blood glucose and maintain a healthy lifestyle.  Working with a diet and nutrition specialist (dietitian) can help you make a meal plan that is best for you.  Keep in mind that carbohydrates and alcohol have immediate effects on your blood glucose levels. It is important to count carbohydrates and to use alcohol carefully. This information is not intended to replace advice given to you by your health care provider. Make sure you discuss any questions you have with your health care provider. Document Released: 06/14/2005 Document Revised: 10/22/2016 Document Reviewed: 10/22/2016 Elsevier Interactive Patient Education  [employer].

## 2018-01-31 NOTE — Telephone Encounter (Signed)
Received Physician Orders/Plan of Care from [facility]; forwarded to provider/SLS 05/03

## 2018-02-03 DIAGNOSIS — R2681 Unsteadiness on feet: Secondary | ICD-10-CM | POA: Diagnosis not present

## 2018-02-03 DIAGNOSIS — M25552 Pain in left hip: Secondary | ICD-10-CM | POA: Diagnosis not present

## 2018-02-03 DIAGNOSIS — R41841 Cognitive communication deficit: Secondary | ICD-10-CM | POA: Diagnosis not present

## 2018-02-03 DIAGNOSIS — M6281 Muscle weakness (generalized): Secondary | ICD-10-CM | POA: Diagnosis not present

## 2018-02-03 DIAGNOSIS — R278 Other lack of coordination: Secondary | ICD-10-CM | POA: Diagnosis not present

## 2018-02-04 DIAGNOSIS — M25552 Pain in left hip: Secondary | ICD-10-CM | POA: Diagnosis not present

## 2018-02-04 DIAGNOSIS — R2681 Unsteadiness on feet: Secondary | ICD-10-CM | POA: Diagnosis not present

## 2018-02-04 DIAGNOSIS — R278 Other lack of coordination: Secondary | ICD-10-CM | POA: Diagnosis not present

## 2018-02-04 DIAGNOSIS — R41841 Cognitive communication deficit: Secondary | ICD-10-CM | POA: Diagnosis not present

## 2018-02-04 DIAGNOSIS — M6281 Muscle weakness (generalized): Secondary | ICD-10-CM | POA: Diagnosis not present

## 2018-02-05 DIAGNOSIS — R2681 Unsteadiness on feet: Secondary | ICD-10-CM | POA: Diagnosis not present

## 2018-02-05 DIAGNOSIS — R278 Other lack of coordination: Secondary | ICD-10-CM | POA: Diagnosis not present

## 2018-02-05 DIAGNOSIS — M25552 Pain in left hip: Secondary | ICD-10-CM | POA: Diagnosis not present

## 2018-02-05 DIAGNOSIS — R41841 Cognitive communication deficit: Secondary | ICD-10-CM | POA: Diagnosis not present

## 2018-02-05 DIAGNOSIS — M6281 Muscle weakness (generalized): Secondary | ICD-10-CM | POA: Diagnosis not present

## 2018-02-06 DIAGNOSIS — R2681 Unsteadiness on feet: Secondary | ICD-10-CM | POA: Diagnosis not present

## 2018-02-06 DIAGNOSIS — M6281 Muscle weakness (generalized): Secondary | ICD-10-CM | POA: Diagnosis not present

## 2018-02-06 DIAGNOSIS — R278 Other lack of coordination: Secondary | ICD-10-CM | POA: Diagnosis not present

## 2018-02-06 DIAGNOSIS — M25552 Pain in left hip: Secondary | ICD-10-CM | POA: Diagnosis not present

## 2018-02-06 DIAGNOSIS — R41841 Cognitive communication deficit: Secondary | ICD-10-CM | POA: Diagnosis not present

## 2018-02-07 DIAGNOSIS — M25552 Pain in left hip: Secondary | ICD-10-CM | POA: Diagnosis not present

## 2018-02-07 DIAGNOSIS — M6281 Muscle weakness (generalized): Secondary | ICD-10-CM | POA: Diagnosis not present

## 2018-02-07 DIAGNOSIS — R41841 Cognitive communication deficit: Secondary | ICD-10-CM | POA: Diagnosis not present

## 2018-02-07 DIAGNOSIS — R2681 Unsteadiness on feet: Secondary | ICD-10-CM | POA: Diagnosis not present

## 2018-02-07 DIAGNOSIS — R278 Other lack of coordination: Secondary | ICD-10-CM | POA: Diagnosis not present

## 2018-02-10 DIAGNOSIS — M6281 Muscle weakness (generalized): Secondary | ICD-10-CM | POA: Diagnosis not present

## 2018-02-10 DIAGNOSIS — R41841 Cognitive communication deficit: Secondary | ICD-10-CM | POA: Diagnosis not present

## 2018-02-10 DIAGNOSIS — M25552 Pain in left hip: Secondary | ICD-10-CM | POA: Diagnosis not present

## 2018-02-10 DIAGNOSIS — R2681 Unsteadiness on feet: Secondary | ICD-10-CM | POA: Diagnosis not present

## 2018-02-10 DIAGNOSIS — R278 Other lack of coordination: Secondary | ICD-10-CM | POA: Diagnosis not present

## 2018-02-11 DIAGNOSIS — M25552 Pain in left hip: Secondary | ICD-10-CM | POA: Diagnosis not present

## 2018-02-11 DIAGNOSIS — R278 Other lack of coordination: Secondary | ICD-10-CM | POA: Diagnosis not present

## 2018-02-11 DIAGNOSIS — R41841 Cognitive communication deficit: Secondary | ICD-10-CM | POA: Diagnosis not present

## 2018-02-11 DIAGNOSIS — R2681 Unsteadiness on feet: Secondary | ICD-10-CM | POA: Diagnosis not present

## 2018-02-11 DIAGNOSIS — M6281 Muscle weakness (generalized): Secondary | ICD-10-CM | POA: Diagnosis not present

## 2018-02-12 DIAGNOSIS — M25552 Pain in left hip: Secondary | ICD-10-CM | POA: Diagnosis not present

## 2018-02-12 DIAGNOSIS — R278 Other lack of coordination: Secondary | ICD-10-CM | POA: Diagnosis not present

## 2018-02-12 DIAGNOSIS — M6281 Muscle weakness (generalized): Secondary | ICD-10-CM | POA: Diagnosis not present

## 2018-02-12 DIAGNOSIS — R2681 Unsteadiness on feet: Secondary | ICD-10-CM | POA: Diagnosis not present

## 2018-02-12 DIAGNOSIS — R41841 Cognitive communication deficit: Secondary | ICD-10-CM | POA: Diagnosis not present

## 2018-02-13 DIAGNOSIS — M6281 Muscle weakness (generalized): Secondary | ICD-10-CM | POA: Diagnosis not present

## 2018-02-13 DIAGNOSIS — R41841 Cognitive communication deficit: Secondary | ICD-10-CM | POA: Diagnosis not present

## 2018-02-13 DIAGNOSIS — R2681 Unsteadiness on feet: Secondary | ICD-10-CM | POA: Diagnosis not present

## 2018-02-13 DIAGNOSIS — R278 Other lack of coordination: Secondary | ICD-10-CM | POA: Diagnosis not present

## 2018-02-13 DIAGNOSIS — M25552 Pain in left hip: Secondary | ICD-10-CM | POA: Diagnosis not present

## 2018-02-14 DIAGNOSIS — M25552 Pain in left hip: Secondary | ICD-10-CM | POA: Diagnosis not present

## 2018-02-14 DIAGNOSIS — M6281 Muscle weakness (generalized): Secondary | ICD-10-CM | POA: Diagnosis not present

## 2018-02-14 DIAGNOSIS — R278 Other lack of coordination: Secondary | ICD-10-CM | POA: Diagnosis not present

## 2018-02-14 DIAGNOSIS — R41841 Cognitive communication deficit: Secondary | ICD-10-CM | POA: Diagnosis not present

## 2018-02-14 DIAGNOSIS — R2681 Unsteadiness on feet: Secondary | ICD-10-CM | POA: Diagnosis not present

## 2018-02-17 DIAGNOSIS — R278 Other lack of coordination: Secondary | ICD-10-CM | POA: Diagnosis not present

## 2018-02-17 DIAGNOSIS — M6281 Muscle weakness (generalized): Secondary | ICD-10-CM | POA: Diagnosis not present

## 2018-02-17 DIAGNOSIS — M25552 Pain in left hip: Secondary | ICD-10-CM | POA: Diagnosis not present

## 2018-02-17 DIAGNOSIS — R2681 Unsteadiness on feet: Secondary | ICD-10-CM | POA: Diagnosis not present

## 2018-02-17 DIAGNOSIS — R41841 Cognitive communication deficit: Secondary | ICD-10-CM | POA: Diagnosis not present

## 2018-02-18 DIAGNOSIS — M6281 Muscle weakness (generalized): Secondary | ICD-10-CM | POA: Diagnosis not present

## 2018-02-18 DIAGNOSIS — M25552 Pain in left hip: Secondary | ICD-10-CM | POA: Diagnosis not present

## 2018-02-18 DIAGNOSIS — R41841 Cognitive communication deficit: Secondary | ICD-10-CM | POA: Diagnosis not present

## 2018-02-18 DIAGNOSIS — R278 Other lack of coordination: Secondary | ICD-10-CM | POA: Diagnosis not present

## 2018-02-18 DIAGNOSIS — R2681 Unsteadiness on feet: Secondary | ICD-10-CM | POA: Diagnosis not present

## 2018-02-20 DIAGNOSIS — M25552 Pain in left hip: Secondary | ICD-10-CM | POA: Diagnosis not present

## 2018-02-20 DIAGNOSIS — R41841 Cognitive communication deficit: Secondary | ICD-10-CM | POA: Diagnosis not present

## 2018-02-20 DIAGNOSIS — R2681 Unsteadiness on feet: Secondary | ICD-10-CM | POA: Diagnosis not present

## 2018-02-20 DIAGNOSIS — M6281 Muscle weakness (generalized): Secondary | ICD-10-CM | POA: Diagnosis not present

## 2018-02-20 DIAGNOSIS — R278 Other lack of coordination: Secondary | ICD-10-CM | POA: Diagnosis not present

## 2018-02-21 DIAGNOSIS — R278 Other lack of coordination: Secondary | ICD-10-CM | POA: Diagnosis not present

## 2018-02-21 DIAGNOSIS — M6281 Muscle weakness (generalized): Secondary | ICD-10-CM | POA: Diagnosis not present

## 2018-02-21 DIAGNOSIS — M25552 Pain in left hip: Secondary | ICD-10-CM | POA: Diagnosis not present

## 2018-02-21 DIAGNOSIS — R41841 Cognitive communication deficit: Secondary | ICD-10-CM | POA: Diagnosis not present

## 2018-02-21 DIAGNOSIS — R2681 Unsteadiness on feet: Secondary | ICD-10-CM | POA: Diagnosis not present

## 2018-02-25 DIAGNOSIS — R278 Other lack of coordination: Secondary | ICD-10-CM | POA: Diagnosis not present

## 2018-02-25 DIAGNOSIS — M6281 Muscle weakness (generalized): Secondary | ICD-10-CM | POA: Diagnosis not present

## 2018-02-25 DIAGNOSIS — R2681 Unsteadiness on feet: Secondary | ICD-10-CM | POA: Diagnosis not present

## 2018-02-25 DIAGNOSIS — M25552 Pain in left hip: Secondary | ICD-10-CM | POA: Diagnosis not present

## 2018-02-25 DIAGNOSIS — R41841 Cognitive communication deficit: Secondary | ICD-10-CM | POA: Diagnosis not present

## 2018-02-26 DIAGNOSIS — M6281 Muscle weakness (generalized): Secondary | ICD-10-CM | POA: Diagnosis not present

## 2018-02-26 DIAGNOSIS — R2681 Unsteadiness on feet: Secondary | ICD-10-CM | POA: Diagnosis not present

## 2018-02-26 DIAGNOSIS — R278 Other lack of coordination: Secondary | ICD-10-CM | POA: Diagnosis not present

## 2018-02-26 DIAGNOSIS — M25552 Pain in left hip: Secondary | ICD-10-CM | POA: Diagnosis not present

## 2018-02-27 DIAGNOSIS — M25552 Pain in left hip: Secondary | ICD-10-CM | POA: Diagnosis not present

## 2018-02-27 DIAGNOSIS — R2681 Unsteadiness on feet: Secondary | ICD-10-CM | POA: Diagnosis not present

## 2018-02-27 DIAGNOSIS — M6281 Muscle weakness (generalized): Secondary | ICD-10-CM | POA: Diagnosis not present

## 2018-02-27 DIAGNOSIS — R278 Other lack of coordination: Secondary | ICD-10-CM | POA: Diagnosis not present

## 2018-03-03 DIAGNOSIS — M6281 Muscle weakness (generalized): Secondary | ICD-10-CM | POA: Diagnosis not present

## 2018-03-03 DIAGNOSIS — M25552 Pain in left hip: Secondary | ICD-10-CM | POA: Diagnosis not present

## 2018-03-03 DIAGNOSIS — R2681 Unsteadiness on feet: Secondary | ICD-10-CM | POA: Diagnosis not present

## 2018-03-03 DIAGNOSIS — R278 Other lack of coordination: Secondary | ICD-10-CM | POA: Diagnosis not present

## 2018-03-05 DIAGNOSIS — R278 Other lack of coordination: Secondary | ICD-10-CM | POA: Diagnosis not present

## 2018-03-05 DIAGNOSIS — R2681 Unsteadiness on feet: Secondary | ICD-10-CM | POA: Diagnosis not present

## 2018-03-05 DIAGNOSIS — M6281 Muscle weakness (generalized): Secondary | ICD-10-CM | POA: Diagnosis not present

## 2018-03-05 DIAGNOSIS — M25552 Pain in left hip: Secondary | ICD-10-CM | POA: Diagnosis not present

## 2018-05-05 ENCOUNTER — Ambulatory Visit (INDEPENDENT_AMBULATORY_CARE_PROVIDER_SITE_OTHER): Payer: Medicare Other | Admitting: Family Medicine

## 2018-05-05 ENCOUNTER — Encounter: Payer: Self-pay | Admitting: Family Medicine

## 2018-05-05 VITALS — BP 118/74 | HR 72 | Temp 97.7°F | Resp 16 | Ht 60.0 in | Wt 132.0 lb

## 2018-05-05 DIAGNOSIS — R300 Dysuria: Secondary | ICD-10-CM

## 2018-05-05 DIAGNOSIS — N898 Other specified noninflammatory disorders of vagina: Secondary | ICD-10-CM

## 2018-05-05 DIAGNOSIS — R3129 Other microscopic hematuria: Secondary | ICD-10-CM

## 2018-05-05 DIAGNOSIS — R350 Frequency of micturition: Secondary | ICD-10-CM

## 2018-05-05 LAB — POCT URINALYSIS DIPSTICK
Bilirubin, UA: NEGATIVE
GLUCOSE UA: NEGATIVE
Ketones, UA: NEGATIVE
LEUKOCYTES UA: NEGATIVE
NITRITE UA: NEGATIVE
Odor: NEGATIVE
PROTEIN UA: NEGATIVE
SPEC GRAV UA: 1.025 (ref 1.010–1.025)
Urobilinogen, UA: NEGATIVE E.U./dL — AB
pH, UA: 6 (ref 5.0–8.0)

## 2018-05-05 LAB — URINALYSIS, MICROSCOPIC ONLY

## 2018-05-05 MED ORDER — TERCONAZOLE 0.4 % VA CREA: 1.0000 | TOPICAL_CREAM | Freq: Every day | VAGINAL | 0 refills | Status: AC

## 2018-05-05 NOTE — Patient Instructions (Addendum)
A few things to remember from today's visit:   Dysuria - Plan: POC Urinalysis Dipstick, Urine culture  Frequent urination - Plan: POC Urinalysis Dipstick  Microscopic hematuria - Plan: Urine Microscopic Only, Urine culture  Vaginal pruritus - Plan: terconazole (TERAZOL 7) 0.4 % vaginal cream   Overactive Bladder, Adult Overactive bladder is a group of urinary symptoms. With overactive bladder, you may suddenly feel the need to pass urine (urinate) right away. After feeling this sudden urge, you might also leak urine if you cannot get to the bathroom fast enough (urinary incontinence). These symptoms might interfere with your daily work or social activities. Overactive bladder symptoms may also wake you up at night. Overactive bladder affects the nerve signals between your bladder and your brain. Your bladder may get the signal to empty before it is full. Very sensitive muscles can also make your bladder squeeze too soon. What are the causes? Many things can cause an overactive bladder. Possible causes include:  Urinary tract infection.  Infection of nearby tissues, such as the prostate.  Prostate enlargement.  Being pregnant with twins or more (multiples).  Surgery on the uterus or urethra.  Bladder stones, inflammation, or tumors.  Drinking too much caffeine or alcohol.  Certain medicines, especially those that you take to help your body get rid of extra fluid (diuretics) by increasing urine production.  Muscle or nerve weakness, especially from: ? A spinal cord injury. ? Stroke. ? Multiple sclerosis. ? Parkinson disease.  Diabetes. This can cause a high urine volume that fills the bladder so quickly that the normal urge to urinate is triggered very strongly.  Constipation. A buildup of too much stool can put pressure on your bladder.  What increases the risk? You may be at greater risk for overactive bladder if you:  Are an older adult.  Smoke.  Are going through  menopause.  Have prostate problems.  Have a neurological disease, such as stroke, dementia, Parkinson disease, or multiple sclerosis (MS).  Eat or drink things that irritate the bladder. These include alcohol, spicy food, and caffeine.  Are overweight or obese.  What are the signs or symptoms? The signs and symptoms of an overactive bladder include:  Sudden, strong urges to urinate.  Leaking urine.  Urinating eight or more times per day.  Waking up to urinate two or more times per night.  How is this diagnosed? Your health care provider may suspect overactive bladder based on your symptoms. The health care provider will do a physical exam and take your medical history. Blood or urine tests may also be done. For example, you might need to have a bladder function test to check how well you can hold your urine. You might also need to see a health care provider who specializes in the urinary tract (urologist). How is this treated? Treatment for overactive bladder depends on the cause of your condition and whether it is mild or severe. Certain treatments can be done in your health care provider's office or clinic. You can also make lifestyle changes at home. Options include: Behavioral Treatments  Biofeedback. A specialist uses sensors to help you become aware of your body's signals.  Keeping a daily log of when you need to urinate and what happens after the urge. This may help you manage your condition.  Bladder training. This helps you learn to control the urge to urinate by following a schedule that directs you to urinate at regular intervals (timed voiding). At first, you might have to  wait a few minutes after feeling the urge. In time, you should be able to schedule bathroom visits an hour or more apart.  Kegel exercises. These are exercises to strengthen the pelvic floor muscles, which support the bladder. Toning these muscles can help you control urination, even if your bladder  muscles are overactive. A specialist will teach you how to do these exercises correctly. They require daily practice.  Weight loss. If you are obese or overweight, losing weight might relieve your symptoms of overactive bladder. Talk to your health care provider about losing weight and whether there is a specific program or method that would work best for you.  Diet change. This might help if constipation is making your overactive bladder worse. Your health care provider or a dietitian can explain ways to change what you eat to ease constipation. You might also need to consume less alcohol and caffeine or drink other fluids at different times of the day.  Stopping smoking.  Wearing pads to absorb leakage while you wait for other treatments to take effect. Physical Treatments  Electrical stimulation. Electrodes send gentle pulses of electricity to strengthen the nerves or muscles that help to control the bladder. Sometimes, the electrodes are placed outside of the body. In other cases, they might be placed inside the body (implanted). This treatment can take several months to have an effect.  Supportive devices. Women may need a plastic device that fits into the vagina and supports the bladder (pessary). Medicines Several medicines can help treat overactive bladder and are usually used along with other treatments. Some are injected into the muscles involved in urination. Others come in pill form. Your health care provider may prescribe:  Antispasmodics. These medicines block the signals that the nerves send to the bladder. This keeps the bladder from releasing urine at the wrong time.  Tricyclic antidepressants. These types of antidepressants also relax bladder muscles.  Surgery  You may have a device implanted to help manage the nerve signals that indicate when you need to urinate.  You may have surgery to implant electrodes for electrical stimulation.  Sometimes, very severe cases of  overactive bladder require surgery to change the shape of the bladder. Follow these instructions at home:  Take medicines only as directed by your health care provider.  Use any implants or a pessary as directed by your health care provider.  Make any diet or lifestyle changes that are recommended by your health care provider. These might include: ? Drinking less fluid or drinking at different times of the day. If you need to urinate often during the night, you may need to stop drinking fluids early in the evening. ? Cutting down on caffeine or alcohol. Both can make an overactive bladder worse. Caffeine is found in coffee, tea, and sodas. ? Doing Kegel exercises to strengthen muscles. ? Losing weight if you need to. ? Eating a healthy and balanced diet to prevent constipation.  Keep a journal or log to track how much and when you drink and also when you feel the need to urinate. This will help your health care provider to monitor your condition. Contact a health care provider if:  Your symptoms do not get better after treatment.  Your pain and discomfort are getting worse.  You have more frequent urges to urinate.  You have a fever. Get help right away if: You are not able to control your bladder at all. This information is not intended to replace advice given to you by  your health care provider. Make sure you discuss any questions you have with your health care provider. Document Released: 07/14/2009 Document Revised: 02/23/2016 Document Reviewed: 02/10/2014 Elsevier Interactive Patient Education  [employer].  Please be sure medication list is accurate. If a new problem present, please set up appointment sooner than planned today.

## 2018-05-05 NOTE — Progress Notes (Signed)
HPI:   Jaime Mooney is a 84 y.o. female, who is here today complaining of 2 months of urinary symptoms.  Symptoms are intermittent but getting more frequent.  Dysuria: Yes. Urinary frequency: present. Urinary urgency: Hx of urinary urgency incontinence, stable. Nocturia x 4. Gross hematuria: Denies  Abdominal pain: Denies.   Nausea or vomiting: Denies.  Abnormal vaginal bleeding or discharge: Negative. Vaginal pruritus.  She denies fever,chills,chnages in appetite or physical actiity.  Postmenopausal. Sexual activity: No Hx of UTI: No UCx on 10/31/2017 grew 10,000 colonies/ml isignificant growth.   OTC medications for this problem: No.   Review of Systems  Constitutional: Negative for activity change, appetite change, fatigue and fever.  Cardiovascular: Negative for leg swelling.  Gastrointestinal: Negative for abdominal pain, constipation, diarrhea, nausea and vomiting.  Genitourinary: Positive for dysuria, frequency and urgency. Negative for decreased urine volume, hematuria, vaginal bleeding and vaginal discharge.  Musculoskeletal: Negative for gait problem and myalgias.      Current Outpatient Medications on File Prior to Visit  Medication Sig Dispense Refill  . calcium elemental as carbonate (TUMS ULTRA 1000) 400 MG chewable tablet Chew 1,000 mg by mouth at bedtime.     No current facility-administered medications on file prior to visit.      Past Medical History:  Diagnosis Date  . Allergy   . Arthritis   . Diabetes mellitus type 2, uncomplicated ([place]) 03/31/7792  . Gallstones 2010  . GERD (gastroesophageal reflux disease)   . HLD (hyperlipidemia)   . HTN (hypertension)   . NSTEMI (non-ST elevated myocardial infarction) ([place])   . PVC (premature ventricular contraction)    Allergies  Allergen Reactions  . Acetaminophen Swelling  . Epinephrine     Causes heart palpatations  . Lisinopril     Facial swelling and tongue swelling  .  Norvasc [Amlodipine Besylate] Swelling  . Prednisone Swelling    Facial swelling, can take in small doses    Social History   Socioeconomic History  . Marital status: Married    Spouse name: Not on file  . Number of children: 3  . Years of education: Not on file  . Highest education level: Not on file  Occupational History  . Occupation: retired  Scientific laboratory technician  . Financial resource strain: Not on file  . Food insecurity:    Worry: Not on file    Inability: Not on file  . Transportation needs:    Medical: Not on file    Non-medical: Not on file  Tobacco Use  . Smoking status: Former Research scientist (life sciences)  . Smokeless tobacco: Never Used  Substance and Sexual Activity  . Alcohol use: Yes    Comment: occasional  . Drug use: No  . Sexual activity: Not on file  Lifestyle  . Physical activity:    Days per week: Not on file    Minutes per session: Not on file  . Stress: Not on file  Relationships  . Social connections:    Talks on phone: Not on file    Gets together: Not on file    Attends religious service: Not on file    Active member of club or organization: Not on file    Attends meetings of clubs or organizations: Not on file    Relationship status: Not on file  Other Topics Concern  . Not on file  Social History Narrative   Married.    2 sons, one daughter    Education: Other.  Vitals:   05/05/18 0948  BP: 118/74  Pulse: 72  Resp: 16  Temp: 97.7 F (36.5 C)  SpO2: 97%   Body mass index is 25.78 kg/m.    Physical Exam  Nursing note and vitals reviewed. Constitutional: She is oriented to person, place, and time. She appears well-developed and well-nourished. No distress.  HENT:  Head: Normocephalic and atraumatic.  Mouth/Throat: Oropharynx is clear and moist and mucous membranes are normal.  Eyes: Conjunctivae are normal.  Cardiovascular: Normal rate and regular rhythm.  Respiratory: Effort normal and breath sounds normal. No respiratory distress.  GI: Soft.  She exhibits no mass. There is no tenderness. There is no CVA tenderness.  Musculoskeletal: She exhibits no edema.       Thoracic back: She exhibits decreased range of motion and deformity.  Kyphosis and scoliosis.  Neurological: She is alert and oriented to person, place, and time.  Skin: Skin is warm. No erythema.  Psychiatric: Her speech is normal. Her mood appears anxious.  Well groomed,good eye conatct.      ASSESSMENT AND PLAN:  Jaime Mooney was seen today for urinary frequency, dysuria and dark urine.  Orders Placed This Encounter  Procedures  . Urine culture  . Urine Microscopic Only  . POC Urinalysis Dipstick     Dysuria  Possible etiologies discussed. Urine dipstick otherwise negative except for blood 1+. I am not convened it is caused by UTI.  Instructed about warning signs. Will follow UCx and give recommendations accordingly.  -     POC Urinalysis Dipstick -     Urine culture  Frequent urination  Seems chronic. ? Overactive bladder.  -     POC Urinalysis Dipstick  Microscopic hematuria  Further recommendations will be given according to microscopic results.  -     Urine Microscopic Only -     Urine culture  Vaginal pruritus  ? Yeast vs vaginal atrophy. Pelvic exam deferred to ehr PCP if symptoms persist. Empiric treatment recommended. F/U with PCP as needed.   -     terconazole (TERAZOL 7) 0.4 % vaginal cream; Place 1 applicator vaginally at bedtime for 7 days.   25 min face to face OV. > 50% was dedicated to discussion of differential Dx, prognosis, treatment options, and some side effects of medications.    Betty G. Martinique, MD  [facility]. [place] office.

## 2018-05-06 ENCOUNTER — Encounter: Payer: Self-pay | Admitting: Family Medicine

## 2018-05-06 LAB — URINE CULTURE
MICRO NUMBER:: 90922917
RESULT: NO GROWTH
SPECIMEN QUALITY:: ADEQUATE

## 2018-06-10 ENCOUNTER — Encounter: Payer: Self-pay | Admitting: Family Medicine

## 2018-06-13 ENCOUNTER — Telehealth: Payer: Self-pay | Admitting: Family Medicine

## 2018-06-13 NOTE — Telephone Encounter (Signed)
Copied from [place] [id number]. Topic: Quick Communication - See Telephone Encounter >> Jun 13, 2018  4:17 PM Neva Seat wrote:  Pt's daughter sent a message to Dr. Volanda Napoleon through [facility].  She is still waiting on a reply regarding the Tertonazole cream - prescribed for vaginal itching. Pt is having issues with being able to put it on.  Pt's daughter is asking if there is a pill that can be taken to help with issues. Please call daughter back to discuss asap.

## 2018-06-15 ENCOUNTER — Other Ambulatory Visit: Payer: Self-pay | Admitting: Family Medicine

## 2018-06-15 DIAGNOSIS — N760 Acute vaginitis: Secondary | ICD-10-CM

## 2018-06-15 MED ORDER — FLUCONAZOLE 150 MG PO TABS: 150.0000 mg | ORAL_TABLET | Freq: Once | ORAL | 0 refills | Status: AC

## 2018-06-20 NOTE — Telephone Encounter (Signed)
Please advise 

## 2018-06-21 NOTE — Telephone Encounter (Signed)
This provider responded to pt's daughter's email and sent an rx for diflucan to her pharmacy last wk.

## 2018-06-23 DIAGNOSIS — H0100B Unspecified blepharitis left eye, upper and lower eyelids: Secondary | ICD-10-CM | POA: Diagnosis not present

## 2018-06-23 DIAGNOSIS — H26493 Other secondary cataract, bilateral: Secondary | ICD-10-CM | POA: Diagnosis not present

## 2018-06-23 DIAGNOSIS — H5213 Myopia, bilateral: Secondary | ICD-10-CM | POA: Diagnosis not present

## 2018-06-23 DIAGNOSIS — H0100A Unspecified blepharitis right eye, upper and lower eyelids: Secondary | ICD-10-CM | POA: Diagnosis not present

## 2018-06-25 NOTE — Telephone Encounter (Signed)
Called pt left a detailed message, regarding her antibiotic that was sent to her pharmacy last week

## 2018-09-03 ENCOUNTER — Telehealth: Payer: Self-pay

## 2018-09-03 NOTE — Telephone Encounter (Signed)
Copied from [place] [id number]. Topic: Referral - Request for Referral >> Sep 03, 2018 11:57 AM Yvette Rack wrote: Has patient seen PCP for this complaint? yes  *If NO, is insurance requiring patient see PCP for this issue before PCP can refer them? Referral for which specialty: Speech Therapy Preferred provider/office: [facility]  Reason for referral: pt requests speech therapy. Per pt daughter [facility] sent a form in requesting approval for speech therapy

## 2018-09-04 NOTE — Telephone Encounter (Signed)
Ok for the orders 

## 2018-09-05 NOTE — Telephone Encounter (Signed)
A copy of the approved order was faxed to attention Merleen Nicely, pt daughter is aware

## 2018-09-05 NOTE — Telephone Encounter (Signed)
ok 

## 2018-09-08 DIAGNOSIS — R41841 Cognitive communication deficit: Secondary | ICD-10-CM | POA: Diagnosis not present

## 2018-09-08 DIAGNOSIS — R498 Other voice and resonance disorders: Secondary | ICD-10-CM | POA: Diagnosis not present

## 2018-09-10 DIAGNOSIS — F0390 Unspecified dementia without behavioral disturbance: Secondary | ICD-10-CM | POA: Diagnosis not present

## 2018-09-10 DIAGNOSIS — R498 Other voice and resonance disorders: Secondary | ICD-10-CM | POA: Diagnosis not present

## 2018-09-10 DIAGNOSIS — B373 Candidiasis of vulva and vagina: Secondary | ICD-10-CM | POA: Diagnosis not present

## 2018-09-10 DIAGNOSIS — R41841 Cognitive communication deficit: Secondary | ICD-10-CM | POA: Diagnosis not present

## 2018-09-15 DIAGNOSIS — R41841 Cognitive communication deficit: Secondary | ICD-10-CM | POA: Diagnosis not present

## 2018-09-15 DIAGNOSIS — R498 Other voice and resonance disorders: Secondary | ICD-10-CM | POA: Diagnosis not present

## 2018-09-17 DIAGNOSIS — R41841 Cognitive communication deficit: Secondary | ICD-10-CM | POA: Diagnosis not present

## 2018-09-17 DIAGNOSIS — R498 Other voice and resonance disorders: Secondary | ICD-10-CM | POA: Diagnosis not present

## 2018-09-22 DIAGNOSIS — R41841 Cognitive communication deficit: Secondary | ICD-10-CM | POA: Diagnosis not present

## 2018-09-22 DIAGNOSIS — R498 Other voice and resonance disorders: Secondary | ICD-10-CM | POA: Diagnosis not present

## 2018-09-26 DIAGNOSIS — R498 Other voice and resonance disorders: Secondary | ICD-10-CM | POA: Diagnosis not present

## 2018-09-26 DIAGNOSIS — R41841 Cognitive communication deficit: Secondary | ICD-10-CM | POA: Diagnosis not present

## 2018-09-29 DIAGNOSIS — R498 Other voice and resonance disorders: Secondary | ICD-10-CM | POA: Diagnosis not present

## 2018-09-29 DIAGNOSIS — R41841 Cognitive communication deficit: Secondary | ICD-10-CM | POA: Diagnosis not present

## 2018-10-01 DIAGNOSIS — B373 Candidiasis of vulva and vagina: Secondary | ICD-10-CM | POA: Diagnosis not present

## 2018-10-03 DIAGNOSIS — R41841 Cognitive communication deficit: Secondary | ICD-10-CM | POA: Diagnosis not present

## 2018-10-03 DIAGNOSIS — R498 Other voice and resonance disorders: Secondary | ICD-10-CM | POA: Diagnosis not present

## 2018-10-10 DIAGNOSIS — R41841 Cognitive communication deficit: Secondary | ICD-10-CM | POA: Diagnosis not present

## 2018-10-10 DIAGNOSIS — R498 Other voice and resonance disorders: Secondary | ICD-10-CM | POA: Diagnosis not present

## 2018-10-13 DIAGNOSIS — R498 Other voice and resonance disorders: Secondary | ICD-10-CM | POA: Diagnosis not present

## 2018-10-13 DIAGNOSIS — R41841 Cognitive communication deficit: Secondary | ICD-10-CM | POA: Diagnosis not present

## 2018-10-15 ENCOUNTER — Emergency Department (HOSPITAL_COMMUNITY)
Admission: EM | Admit: 2018-10-15 | Discharge: 2018-10-15 | Disposition: A | Discharge status: Home / Self Care | Payer: Medicare Other | Attending: Emergency Medicine | Admitting: Emergency Medicine

## 2018-10-15 ENCOUNTER — Encounter (HOSPITAL_COMMUNITY): Payer: Self-pay | Admitting: *Deleted

## 2018-10-15 ENCOUNTER — Other Ambulatory Visit: Payer: Self-pay

## 2018-10-15 ENCOUNTER — Emergency Department (HOSPITAL_COMMUNITY): Payer: Medicare Other

## 2018-10-15 DIAGNOSIS — Z79899 Other long term (current) drug therapy: Secondary | ICD-10-CM | POA: Insufficient documentation

## 2018-10-15 DIAGNOSIS — I10 Essential (primary) hypertension: Secondary | ICD-10-CM | POA: Insufficient documentation

## 2018-10-15 DIAGNOSIS — R51 Headache: Secondary | ICD-10-CM | POA: Insufficient documentation

## 2018-10-15 DIAGNOSIS — Z87891 Personal history of nicotine dependence: Secondary | ICD-10-CM | POA: Insufficient documentation

## 2018-10-15 DIAGNOSIS — R519 Headache, unspecified: Secondary | ICD-10-CM

## 2018-10-15 DIAGNOSIS — E119 Type 2 diabetes mellitus without complications: Secondary | ICD-10-CM | POA: Diagnosis not present

## 2018-10-15 LAB — CBC WITH DIFFERENTIAL/PLATELET
Abs Immature Granulocytes: 0.01 10*3/uL (ref 0.00–0.07)
BASOS ABS: 0 10*3/uL (ref 0.0–0.1)
Basophils Relative: 1 %
EOS ABS: 0.1 10*3/uL (ref 0.0–0.5)
EOS PCT: 1 %
HCT: 41.9 % (ref 36.0–46.0)
HEMOGLOBIN: 13.5 g/dL (ref 12.0–15.0)
Immature Granulocytes: 0 %
LYMPHS PCT: 24 %
Lymphs Abs: 1.5 10*3/uL (ref 0.7–4.0)
MCH: 32.5 pg (ref 26.0–34.0)
MCHC: 32.2 g/dL (ref 30.0–36.0)
MCV: 100.7 fL — ABNORMAL HIGH (ref 80.0–100.0)
MONO ABS: 0.5 10*3/uL (ref 0.1–1.0)
Monocytes Relative: 7 %
NRBC: 0 % (ref 0.0–0.2)
Neutro Abs: 4.3 10*3/uL (ref 1.7–7.7)
Neutrophils Relative %: 67 %
Platelets: 195 10*3/uL (ref 150–400)
RBC: 4.16 MIL/uL (ref 3.87–5.11)
RDW: 12.3 % (ref 11.5–15.5)
WBC: 6.4 10*3/uL (ref 4.0–10.5)

## 2018-10-15 LAB — URINALYSIS, ROUTINE W REFLEX MICROSCOPIC
BACTERIA UA: NONE SEEN
Bilirubin Urine: NEGATIVE
Glucose, UA: NEGATIVE mg/dL
Ketones, ur: NEGATIVE mg/dL
Nitrite: NEGATIVE
PROTEIN: NEGATIVE mg/dL
SPECIFIC GRAVITY, URINE: 1.008 (ref 1.005–1.030)
pH: 6 (ref 5.0–8.0)

## 2018-10-15 LAB — BASIC METABOLIC PANEL
Anion gap: 8 (ref 5–15)
BUN: 17 mg/dL (ref 8–23)
CALCIUM: 9.2 mg/dL (ref 8.9–10.3)
CHLORIDE: 105 mmol/L (ref 98–111)
CO2: 27 mmol/L (ref 22–32)
CREATININE: 0.69 mg/dL (ref 0.44–1.00)
GFR calc Af Amer: >60 mL/min (ref >60)
GFR calc non Af Amer: >60 mL/min (ref >60)
Glucose, Bld: 166 mg/dL — ABNORMAL HIGH (ref 70–99)
Potassium: 4.2 mmol/L (ref 3.5–5.1)
SODIUM: 140 mmol/L (ref 135–145)

## 2018-10-15 LAB — PROTIME-INR
INR: 1.09
PROTHROMBIN TIME: 14 s (ref 11.4–15.2)

## 2018-10-15 LAB — SEDIMENTATION RATE: Sed Rate: 10 mm/hr (ref 0–22)

## 2018-10-15 NOTE — ED Triage Notes (Signed)
Pt in reports having a headache & lethargy since Friday, denies taking blood thinners onset of symptoms since Friday, A&O x4, negative VAN, NIH 0

## 2018-10-15 NOTE — ED Provider Notes (Signed)
[facility] EMERGENCY DEPARTMENT Provider Note   CSN: [id number] Arrival date & time: 10/15/18  1228     History   Chief Complaint No chief complaint on file.   HPI Jaime Mooney is a 85 y.o. female.  HPI Patient reports she is had a headache for about the past 5 or 6 days.  She reports that it is moderate in nature and mostly on the top of her head and somewhat more to the left side.  It has been aching in quality.  She reports she has has not felt very well.  She has had very low energy.  Patient denies a headache was sudden in onset.  She has not noticed any specific weakness.  She reports that she is not dropping things or feeling numb or tingling in her extremities.  She does not notice any facial drooping or slurring of her speech.  Patient reports that today her doctor advised that she get checked in the emergency department.  She reports she does have a follow-up appointment with neurology scheduled but does not for another month or more.  Patient reports she is always had problems with dizziness.  She does not note that she is any more dizzy than usual.  This is typically a movement sensation.  She has not had any fever, blurred vision or vomiting. Past Medical History:  Diagnosis Date  . Allergy   . Arthritis   . Diabetes mellitus type 2, uncomplicated ([place]) 0/93/8182  . Gallstones 2010  . GERD (gastroesophageal reflux disease)   . HLD (hyperlipidemia)   . HTN (hypertension)   . NSTEMI (non-ST elevated myocardial infarction) ([place])   . PVC (premature ventricular contraction)     Patient Active Problem List   Diagnosis Date Noted  . Moderate dementia without behavioral disturbance ([place]) 10/19/2017  . Elevated troponin   . Other forms of angina pectoris ([place])   . Chest pain 11/01/2016  . Non-STEMI (non-ST elevated myocardial infarction) ([place]) 11/01/2016  . Dyspnea 11/01/2016  . Type 2 diabetes mellitus without complication, with long-term current use  of insulin ([place]) 06/19/2016  . Abnormal EKG 11/15/2014  . PVC (premature ventricular contraction)   . Urinary incontinence 12/02/2013  . Left hip pain 12/02/2013  . Left shoulder pain 12/02/2013  . Scoliosis 12/02/2013  . Allergic rhinitis due to pollen 12/02/2013    Past Surgical History:  Procedure Laterality Date  . BREAST SURGERY    . CARDIAC CATHETERIZATION N/A 11/02/2016   Procedure: Left Heart Cath and Coronary Angiography;  Surgeon: Belva Crome, MD;  Location: [facility];  Service: Cardiovascular;  Laterality: N/A;  . CHOLECYSTECTOMY    . EYE SURGERY     cataracts bilateral   . FRACTURE SURGERY    . gallbladder surgery 5 years ago    . left breast surgery 20 yrs ago    . nose fracture 16 yeras ago    . right breast surgery 2012       OB History   No obstetric history on file.      Home Medications    Prior to Admission medications   Medication Sig Start Date End Date Taking? Authorizing Provider  calcium elemental as carbonate (TUMS ULTRA 1000) 400 MG chewable tablet Chew 1,000 mg by mouth at bedtime.    Yes [provider]  fluconazole (DIFLUCAN) 100 MG tablet Take 100 mg by mouth daily. FOR 5 DAYS 10/08/18  Yes [provider]  nystatin cream (MYCOSTATIN)  Apply 1 application topically as directed. 10/08/18  Yes [provider]  TURMERIC PO Take 1 capsule by mouth daily.   Yes [provider]    Family History Family History  Problem Relation Age of Onset  . Esophageal cancer Mother        mets  . Diabetes Father   . Heart disease Father   . Stroke Father   . Prostate cancer Father   . Skin cancer Sister   . Heart disease Maternal Grandmother   . Heart disease Maternal Grandfather   . Heart disease Paternal Grandmother   . Diabetes Paternal Grandfather     Social History Social History   Tobacco Use  . Smoking status: Former Research scientist (life sciences)  . Smokeless tobacco: Never Used  Substance Use Topics  . Alcohol use: Yes     Comment: occasional  . Drug use: No     Allergies   Acetaminophen; Lisinopril; Norvasc [amlodipine besylate]; Prednisone; and Epinephrine   Review of Systems Review of Systems 10 Systems reviewed and are negative for acute change except as noted in the HPI.   Physical Exam Updated Vital Signs BP (!) 161/60   Pulse 66   Temp 98.2 F (36.8 C) (Temporal)   Resp 17   SpO2 99%   Physical Exam Constitutional:      Appearance: She is well-developed.  HENT:     Head: Normocephalic and atraumatic.  Eyes:     Pupils: Pupils are equal, round, and reactive to light.  Neck:     Musculoskeletal: Neck supple.  Cardiovascular:     Rate and Rhythm: Normal rate and regular rhythm.     Heart sounds: Normal heart sounds.  Pulmonary:     Effort: Pulmonary effort is normal.     Breath sounds: Normal breath sounds.  Abdominal:     General: Bowel sounds are normal. There is no distension.     Palpations: Abdomen is soft.     Tenderness: There is no abdominal tenderness.  Musculoskeletal: Normal range of motion.  Skin:    General: Skin is warm and dry.  Neurological:     General: No focal deficit present.     Mental Status: She is alert and oriented to person, place, and time.     GCS: GCS eye subscore is 4. GCS verbal subscore is 5. GCS motor subscore is 6.     Coordination: Coordination normal.     Comments: Mental status is clear.  Speech is clear.  Cognitive function normal.  Upper and lower extremity motor strength 5\5.  No sensory deficits.  Psychiatric:        Mood and Affect: Mood normal.      ED Treatments / Results  Labs (all labs ordered are listed, but only abnormal results are displayed) Labs Reviewed  BASIC METABOLIC PANEL - Abnormal; Notable for the following components:      Result Value   Glucose, Bld 166 (*)    All other components within normal limits  CBC WITH DIFFERENTIAL/PLATELET - Abnormal; Notable for the following components:   MCV 100.7 (*)     All other components within normal limits  URINALYSIS, ROUTINE W REFLEX MICROSCOPIC - Abnormal; Notable for the following components:   Color, Urine STRAW (*)    Hgb urine dipstick SMALL (*)    Leukocytes, UA SMALL (*)    All other components within normal limits  PROTIME-INR  SEDIMENTATION RATE    EKG EKG Interpretation  Date/Time:  Wednesday October 15 2018 12:51:22 EST Ventricular Rate:  68 PR Interval:    QRS Duration: 103 QT Interval:  408 QTC Calculation: 434 R Axis:   12 Text Interpretation:  Sinus rhythm Ventricular premature complex Probable left atrial enlargement Low voltage, precordial leads no change from previous Confirmed by Charlesetta Shanks [phone]) on 10/15/2018 5:57:53 PM   Radiology Ct Head Wo Contrast  Result Date: 10/15/2018 CLINICAL DATA:  Pt in reports having a headache AND lethargy since Friday, denies taking blood thinners onset of symptoms since Friday EXAM: CT HEAD WITHOUT CONTRAST TECHNIQUE: Contiguous axial images were obtained from the base of the skull through the vertex without intravenous contrast. COMPARISON:  Brain MRI, 04/06/2017 FINDINGS: Brain: No evidence of acute infarction, hemorrhage, hydrocephalus, extra-axial collection or mass lesion/mass effect. There is age appropriate ventricular and sulcal enlargement. Mild periventricular white matter hypoattenuation is present consistent with chronic microvascular ischemic change. These changes are stable from the prior brain MRI. Vascular: No hyperdense vessel or unexpected calcification. Skull: Normal. Negative for fracture or focal lesion. Sinuses/Orbits: Globes and orbits are unremarkable. Sinuses and mastoid air cells are clear. Other: None. IMPRESSION: 1. No acute intracranial abnormalities. 2. Age-appropriate volume loss and mild chronic microvascular ischemic change. Electronically Signed   By: Lajean Manes M.D.   On: 10/15/2018 15:17    Procedures Procedures (including critical care  time)  Medications Ordered in ED Medications - No data to display   Initial Impression / Assessment and Plan / ED Course  I have reviewed the triage vital signs and the nursing notes.  Pertinent labs & imaging results that were available during my care of the patient were reviewed by me and considered in my medical decision making (see chart for details).    Patient was referred to the emergency department for headache and some potential concern for stroke.  Patient's history and physical examination are not suggestive of strokelike illness.  Her mental status is clear and she has no neurologic deficits.  She does not describe, resolved deficits.  Patient has had a headache which is mostly left-sided.  This was not sudden or severe in onset to suggest subarachnoid hemorrhage.  CT shows no acute findings.  Sed rate is normal.  I have very low suspicion for infectious etiology or autoimmune such as temporal arteritis.  Patient's general condition appears to be good.  She is alert and without deficit.  At this time I feel she is stable for continued outpatient management.  Final Clinical Impressions(s) / ED Diagnoses   Final diagnoses:  Nonintractable episodic headache, unspecified headache type    ED Discharge Orders    None       Charlesetta Shanks, MD 10/15/18 1759

## 2018-10-16 DIAGNOSIS — Z79899 Other long term (current) drug therapy: Secondary | ICD-10-CM | POA: Diagnosis not present

## 2018-10-21 DIAGNOSIS — R498 Other voice and resonance disorders: Secondary | ICD-10-CM | POA: Diagnosis not present

## 2018-10-21 DIAGNOSIS — R41841 Cognitive communication deficit: Secondary | ICD-10-CM | POA: Diagnosis not present

## 2018-10-23 DIAGNOSIS — R498 Other voice and resonance disorders: Secondary | ICD-10-CM | POA: Diagnosis not present

## 2018-10-23 DIAGNOSIS — R41841 Cognitive communication deficit: Secondary | ICD-10-CM | POA: Diagnosis not present

## 2018-10-28 DIAGNOSIS — R41841 Cognitive communication deficit: Secondary | ICD-10-CM | POA: Diagnosis not present

## 2018-10-28 DIAGNOSIS — R498 Other voice and resonance disorders: Secondary | ICD-10-CM | POA: Diagnosis not present

## 2018-10-30 DIAGNOSIS — R41841 Cognitive communication deficit: Secondary | ICD-10-CM | POA: Diagnosis not present

## 2018-10-30 DIAGNOSIS — R498 Other voice and resonance disorders: Secondary | ICD-10-CM | POA: Diagnosis not present

## 2018-11-04 DIAGNOSIS — R498 Other voice and resonance disorders: Secondary | ICD-10-CM | POA: Diagnosis not present

## 2018-11-04 DIAGNOSIS — R41841 Cognitive communication deficit: Secondary | ICD-10-CM | POA: Diagnosis not present

## 2018-11-06 DIAGNOSIS — R498 Other voice and resonance disorders: Secondary | ICD-10-CM | POA: Diagnosis not present

## 2018-11-06 DIAGNOSIS — R41841 Cognitive communication deficit: Secondary | ICD-10-CM | POA: Diagnosis not present

## 2018-11-10 ENCOUNTER — Encounter: Payer: Self-pay | Admitting: Neurology

## 2018-11-10 DIAGNOSIS — R498 Other voice and resonance disorders: Secondary | ICD-10-CM | POA: Diagnosis not present

## 2018-11-10 DIAGNOSIS — R41841 Cognitive communication deficit: Secondary | ICD-10-CM | POA: Diagnosis not present

## 2018-11-12 DIAGNOSIS — R41841 Cognitive communication deficit: Secondary | ICD-10-CM | POA: Diagnosis not present

## 2018-11-12 DIAGNOSIS — R498 Other voice and resonance disorders: Secondary | ICD-10-CM | POA: Diagnosis not present

## 2018-11-18 DIAGNOSIS — R498 Other voice and resonance disorders: Secondary | ICD-10-CM | POA: Diagnosis not present

## 2018-11-18 DIAGNOSIS — R41841 Cognitive communication deficit: Secondary | ICD-10-CM | POA: Diagnosis not present

## 2018-11-20 DIAGNOSIS — R498 Other voice and resonance disorders: Secondary | ICD-10-CM | POA: Diagnosis not present

## 2018-11-20 DIAGNOSIS — R41841 Cognitive communication deficit: Secondary | ICD-10-CM | POA: Diagnosis not present

## 2018-11-24 DIAGNOSIS — R41841 Cognitive communication deficit: Secondary | ICD-10-CM | POA: Diagnosis not present

## 2018-11-24 DIAGNOSIS — R498 Other voice and resonance disorders: Secondary | ICD-10-CM | POA: Diagnosis not present

## 2018-11-26 DIAGNOSIS — R41841 Cognitive communication deficit: Secondary | ICD-10-CM | POA: Diagnosis not present

## 2018-11-26 DIAGNOSIS — R498 Other voice and resonance disorders: Secondary | ICD-10-CM | POA: Diagnosis not present

## 2018-12-01 ENCOUNTER — Ambulatory Visit: Payer: Medicare Other | Admitting: Neurology

## 2018-12-01 DIAGNOSIS — R498 Other voice and resonance disorders: Secondary | ICD-10-CM | POA: Diagnosis not present

## 2018-12-01 DIAGNOSIS — R41841 Cognitive communication deficit: Secondary | ICD-10-CM | POA: Diagnosis not present

## 2018-12-02 ENCOUNTER — Other Ambulatory Visit: Payer: Self-pay

## 2018-12-02 ENCOUNTER — Ambulatory Visit (INDEPENDENT_AMBULATORY_CARE_PROVIDER_SITE_OTHER): Payer: Medicare Other | Admitting: Neurology

## 2018-12-02 ENCOUNTER — Encounter: Payer: Self-pay | Admitting: Neurology

## 2018-12-02 VITALS — BP 187/75 | HR 76 | Resp 18 | Ht 60.0 in | Wt 135.0 lb

## 2018-12-02 DIAGNOSIS — G301 Alzheimer's disease with late onset: Secondary | ICD-10-CM

## 2018-12-02 DIAGNOSIS — E538 Deficiency of other specified B group vitamins: Secondary | ICD-10-CM | POA: Diagnosis not present

## 2018-12-02 DIAGNOSIS — R413 Other amnesia: Secondary | ICD-10-CM | POA: Diagnosis not present

## 2018-12-02 DIAGNOSIS — F0280 Dementia in other diseases classified elsewhere without behavioral disturbance: Secondary | ICD-10-CM

## 2018-12-02 NOTE — Progress Notes (Signed)
Reason for visit: Headache, memory disturbance, aphasia  Referring physician: Dr. Domingo Mooney is a 85 y.o. female  History of present illness:  Jaime Mooney is an 85 year old right-handed white female with a history of a progressive aphasia syndrome and a memory disturbance.  Jaime Mooney comes in today with her daughter who gives most of Jaime history.  She has had some troubles with memory and language over Jaime last 2 years.  Jaime Mooney currently lives in [place] with her husband who currently is on hospice.  Jaime Mooney has been under a lot of stress, approximately 1 month ago she had onset of a headache which is very unusual for her.  Jaime Mooney claims of Jaime headache did not come on suddenly but more gradually.  Jaime Mooney had a headache for about 2 days, she had a decrease in her ability to walk, she was taken to Jaime emergency room Jaime second day of Jaime headache, CT scan of Jaime brain did not show an acute stroke.  Jaime Mooney has resolved Jaime headache and has not had any residual changes in speech, sensation, or strength.  Jaime Mooney has some gait instability, she refuses to use a cane or walker, she will fall on occasion.  Jaime Mooney in Jaime past has refused to go on any medications for memory.  Jaime Mooney has not been sleeping well secondary to Jaime recent stress.  Blood work shows an elevated MCV level, she had a urinalysis that did not show evidence of a bladder infection.  She had gone to Jaime emergency room on 15 October 2018.  Past Medical History:  Diagnosis Date  . Allergy   . Arthritis   . Diabetes mellitus type 2, uncomplicated ([place]) 5/36/6440  . Gallstones 2010  . GERD (gastroesophageal reflux disease)   . HLD (hyperlipidemia)   . HTN (hypertension)   . NSTEMI (non-ST elevated myocardial infarction) ([place])   . PVC (premature ventricular contraction)   . Vision abnormalities     Past Surgical History:  Procedure Laterality Date  . BREAST SURGERY    .  CARDIAC CATHETERIZATION N/A 11/02/2016   Procedure: Left Heart Cath and Coronary Angiography;  Surgeon: Belva Crome, MD;  Location: [facility];  Service: Cardiovascular;  Laterality: N/A;  . CHOLECYSTECTOMY    . EYE SURGERY     cataracts bilateral   . FRACTURE SURGERY    . gallbladder surgery 5 years ago    . left breast surgery 20 yrs ago    . nose fracture 16 yeras ago    . right breast surgery 2012      Family History  Problem Relation Age of Onset  . Esophageal cancer Mother        mets  . Diabetes Father   . Heart disease Father   . Stroke Father   . Prostate cancer Father   . Skin cancer Sister   . Heart disease Maternal Grandmother   . Heart disease Maternal Grandfather   . Heart disease Paternal Grandmother   . Diabetes Paternal Grandfather     Social history:  reports that she has quit smoking. She has never used smokeless tobacco. She reports current alcohol use. She reports that she does not use drugs.  Medications:  Prior to Admission medications   Medication Sig Start Date End Date Taking? Authorizing Provider  calcium elemental as carbonate (TUMS ULTRA 1000) 400 MG chewable tablet Chew 1,000 mg by mouth at bedtime.  Yes [provider]  nystatin cream (MYCOSTATIN) Apply 1 application topically as directed. 10/08/18  Yes [provider]  TURMERIC PO Take 1 capsule by mouth daily.   Yes [provider]      Allergies  Allergen Reactions  . Acetaminophen Swelling    Mooney doesn't recall site of swelling (??)  . Lisinopril Swelling and Other (See Comments)    Facial swelling and tongue swelling  . Norvasc [Amlodipine Besylate] Swelling    Mooney doesn't recall site of swelling (??)  . Prednisone Swelling and Other (See Comments)    Facial swelling, can take in small doses  . Epinephrine Palpitations    ROS:  Out of a complete 14 system review of symptoms, Jaime Mooney complains only of Jaime following symptoms, and all  other reviewed systems are negative.  Palpitations of Jaime heart Blurred vision, floaters Skin rash Incontinence of Jaime bladder Aching muscles, back and hip pain Skin sensitivity Memory loss, confusion, headache, dizziness Depression, anxiety, racing thoughts  Blood pressure (!) 187/75, pulse 76, resp. rate 18, height 5' (1.524 m), weight 135 lb (61.2 kg).  Physical Exam  General: Jaime Mooney is alert and cooperative at Jaime time of Jaime examination.  Eyes: Pupils are equal, round, and reactive to light. Discs are flat bilaterally.  Neck: Jaime neck is supple, no carotid bruits are noted.  Respiratory: Jaime respiratory examination is clear.  Cardiovascular: Jaime cardiovascular examination reveals a regular rate and rhythm, no obvious murmurs or rubs are noted.  Neuromuscular: Jaime Mooney has severe scoliosis, kyphosis.  Skin: Extremities are without significant edema.  Neurologic Exam  Mental status: Jaime Mooney is alert and oriented x 2 at Jaime time of Jaime examination (not oriented to date). Jaime Mini-Mental status examination done today shows a total score of 22/30.  Jaime Mooney is able to name 6 four-legged animals in 1 minute.  Cranial nerves: Facial symmetry is present. There is good sensation of Jaime face to pinprick and soft touch bilaterally. Jaime strength of Jaime facial muscles and Jaime muscles to head turning and shoulder shrug are normal bilaterally. Speech is nonfluent, aphasic, not dysarthric. Extraocular movements are full. Visual fields are full. Jaime tongue is midline, and Jaime Mooney has symmetric elevation of Jaime soft palate. No obvious hearing deficits are noted.  Motor: Jaime motor testing reveals 5 over 5 strength of all 4 extremities. Good symmetric motor tone is noted throughout.  Sensory: Sensory testing is intact to pinprick, soft touch, vibration sensation, and position sense on all 4 extremities, with exception of decreased position sense on both feet. No evidence of  extinction is noted.  Coordination: Cerebellar testing reveals good finger-nose-finger and heel-to-shin bilaterally.  No significant apraxia was noted.  Gait and station: Gait is slightly wide-based, unsteady.  Tandem gait was not tested.  Romberg is negative.  Reflexes: Deep tendon reflexes are symmetric and normal bilaterally. Toes are downgoing bilaterally.   CT head 10/15/18:  IMPRESSION: 1. No acute intracranial abnormalities. 2. Age-appropriate volume loss and mild chronic microvascular ischemic change.  * CT scan images were reviewed online. I agree with Jaime written report.    Assessment/Plan:  1.  Progressive memory disturbance, progressive aphasia  2.  Gait disorder  3.  Headache, transient  Jaime Mooney likely has an Alzheimer's process associated with a progressive aphasia.  Jaime Mooney does not wish to go on a medication for memory.  She has been under a lot of stress recently with Jaime illness of her husband,  she does not wish to go on medications for depression or anxiety.  Jaime Mooney does not operate a motor vehicle, she does require assistance keeping up with medications and appointments, she requires some assistance with bathing and dressing.  She will follow-up here on an as-needed basis, blood work will be done today.  She has not had any further headaches.   Jill Alexanders MD 12/02/2018 7:34 AM  [facility] [address] [place], [place] [address]  Phone [phone] Fax [phone]

## 2018-12-03 DIAGNOSIS — R498 Other voice and resonance disorders: Secondary | ICD-10-CM | POA: Diagnosis not present

## 2018-12-03 DIAGNOSIS — R41841 Cognitive communication deficit: Secondary | ICD-10-CM | POA: Diagnosis not present

## 2018-12-03 LAB — VITAMIN B12: Vitamin B-12: 360 pg/mL (ref 232–1245)

## 2018-12-03 LAB — SEDIMENTATION RATE: Sed Rate: 4 mm/hr (ref 0–40)

## 2018-12-03 LAB — RPR: RPR: NONREACTIVE

## 2018-12-04 ENCOUNTER — Telehealth: Payer: Self-pay | Admitting: Neurology

## 2018-12-04 MED ORDER — ALPRAZOLAM 0.25 MG PO TABS: 0.2500 mg | ORAL_TABLET | Freq: Three times a day (TID) | ORAL | 1 refills | Status: AC | PRN

## 2018-12-04 NOTE — Telephone Encounter (Signed)
Pts daughter Belenda Cruise on the [place] called wanting to know if her mother can be given something for her anxiety. She just had a panic attack and daughter is wanting to know if she can get something that is not a daily thing. Please advise.

## 2018-12-04 NOTE — Telephone Encounter (Signed)
I called the daughter.  The patient is having some problems with anxiety and panic attacks, she will not take a daily medication, I will give him low-dose alprazolam to take if needed for anxiety episodes.

## 2018-12-06 ENCOUNTER — Emergency Department (HOSPITAL_COMMUNITY): Payer: Medicare Other

## 2018-12-06 ENCOUNTER — Encounter (HOSPITAL_COMMUNITY): Payer: Self-pay | Admitting: *Deleted

## 2018-12-06 ENCOUNTER — Other Ambulatory Visit: Payer: Self-pay

## 2018-12-06 ENCOUNTER — Inpatient Hospital Stay (HOSPITAL_COMMUNITY)
Admission: EM | Admit: 2018-12-06 | Discharge: 2018-12-10 | DRG: 392 | Disposition: A | Discharge status: Home Health | Payer: Medicare Other | Attending: Internal Medicine | Admitting: Internal Medicine

## 2018-12-06 DIAGNOSIS — R1084 Generalized abdominal pain: Secondary | ICD-10-CM | POA: Diagnosis not present

## 2018-12-06 DIAGNOSIS — I10 Essential (primary) hypertension: Secondary | ICD-10-CM

## 2018-12-06 DIAGNOSIS — K5730 Diverticulosis of large intestine without perforation or abscess without bleeding: Secondary | ICD-10-CM | POA: Diagnosis not present

## 2018-12-06 DIAGNOSIS — K529 Noninfective gastroenteritis and colitis, unspecified: Principal | ICD-10-CM | POA: Diagnosis present

## 2018-12-06 DIAGNOSIS — Z6825 Body mass index (BMI) 25.0-25.9, adult: Secondary | ICD-10-CM

## 2018-12-06 DIAGNOSIS — R4701 Aphasia: Secondary | ICD-10-CM | POA: Diagnosis not present

## 2018-12-06 DIAGNOSIS — K5900 Constipation, unspecified: Secondary | ICD-10-CM | POA: Diagnosis present

## 2018-12-06 DIAGNOSIS — Z888 Allergy status to other drugs, medicaments and biological substances status: Secondary | ICD-10-CM

## 2018-12-06 DIAGNOSIS — E119 Type 2 diabetes mellitus without complications: Secondary | ICD-10-CM

## 2018-12-06 DIAGNOSIS — R000 Tachycardia, unspecified: Secondary | ICD-10-CM | POA: Diagnosis not present

## 2018-12-06 DIAGNOSIS — I2510 Atherosclerotic heart disease of native coronary artery without angina pectoris: Secondary | ICD-10-CM | POA: Diagnosis not present

## 2018-12-06 DIAGNOSIS — G309 Alzheimer's disease, unspecified: Secondary | ICD-10-CM | POA: Diagnosis present

## 2018-12-06 DIAGNOSIS — K449 Diaphragmatic hernia without obstruction or gangrene: Secondary | ICD-10-CM

## 2018-12-06 DIAGNOSIS — F419 Anxiety disorder, unspecified: Secondary | ICD-10-CM | POA: Diagnosis present

## 2018-12-06 DIAGNOSIS — F0280 Dementia in other diseases classified elsewhere without behavioral disturbance: Secondary | ICD-10-CM | POA: Diagnosis present

## 2018-12-06 DIAGNOSIS — K5790 Diverticulosis of intestine, part unspecified, without perforation or abscess without bleeding: Secondary | ICD-10-CM | POA: Diagnosis present

## 2018-12-06 DIAGNOSIS — Z8249 Family history of ischemic heart disease and other diseases of the circulatory system: Secondary | ICD-10-CM

## 2018-12-06 DIAGNOSIS — R627 Adult failure to thrive: Secondary | ICD-10-CM

## 2018-12-06 DIAGNOSIS — R112 Nausea with vomiting, unspecified: Secondary | ICD-10-CM | POA: Diagnosis present

## 2018-12-06 DIAGNOSIS — I491 Atrial premature depolarization: Secondary | ICD-10-CM | POA: Diagnosis not present

## 2018-12-06 DIAGNOSIS — Z833 Family history of diabetes mellitus: Secondary | ICD-10-CM

## 2018-12-06 DIAGNOSIS — Z794 Long term (current) use of insulin: Secondary | ICD-10-CM

## 2018-12-06 DIAGNOSIS — Z79899 Other long term (current) drug therapy: Secondary | ICD-10-CM

## 2018-12-06 DIAGNOSIS — R52 Pain, unspecified: Secondary | ICD-10-CM | POA: Diagnosis not present

## 2018-12-06 DIAGNOSIS — R0789 Other chest pain: Secondary | ICD-10-CM | POA: Diagnosis present

## 2018-12-06 DIAGNOSIS — R109 Unspecified abdominal pain: Secondary | ICD-10-CM | POA: Diagnosis not present

## 2018-12-06 DIAGNOSIS — E1165 Type 2 diabetes mellitus with hyperglycemia: Secondary | ICD-10-CM | POA: Diagnosis not present

## 2018-12-06 HISTORY — DX: Diaphragmatic hernia without obstruction or gangrene: K44.9

## 2018-12-06 HISTORY — DX: Diverticulosis of intestine, part unspecified, without perforation or abscess without bleeding: K57.90

## 2018-12-06 HISTORY — DX: Essential (primary) hypertension: I10

## 2018-12-06 LAB — COMPREHENSIVE METABOLIC PANEL
ALT: 20 U/L (ref 0–44)
ANION GAP: 14 (ref 5–15)
AST: 22 U/L (ref 15–41)
Albumin: 3.8 g/dL (ref 3.5–5.0)
Alkaline Phosphatase: 76 U/L (ref 38–126)
BUN: 24 mg/dL — ABNORMAL HIGH (ref 8–23)
CO2: 23 mmol/L (ref 22–32)
Calcium: 8.9 mg/dL (ref 8.9–10.3)
Chloride: 103 mmol/L (ref 98–111)
Creatinine, Ser: 0.65 mg/dL (ref 0.44–1.00)
GFR calc Af Amer: >60 mL/min (ref >60)
Glucose, Bld: 210 mg/dL — ABNORMAL HIGH (ref 70–99)
Potassium: 4.1 mmol/L (ref 3.5–5.1)
Sodium: 140 mmol/L (ref 135–145)
Total Bilirubin: 0.9 mg/dL (ref 0.3–1.2)
Total Protein: 7.2 g/dL (ref 6.5–8.1)

## 2018-12-06 LAB — CBC
HCT: 44 % (ref 36.0–46.0)
Hemoglobin: 14 g/dL (ref 12.0–15.0)
MCH: 31.6 pg (ref 26.0–34.0)
MCHC: 31.8 g/dL (ref 30.0–36.0)
MCV: 99.3 fL (ref 80.0–100.0)
Platelets: 189 10*3/uL (ref 150–400)
RBC: 4.43 MIL/uL (ref 3.87–5.11)
RDW: 11.9 % (ref 11.5–15.5)
WBC: 13.1 10*3/uL — ABNORMAL HIGH (ref 4.0–10.5)
nRBC: 0 % (ref 0.0–0.2)

## 2018-12-06 LAB — URINALYSIS, ROUTINE W REFLEX MICROSCOPIC
Bacteria, UA: NONE SEEN
Bilirubin Urine: NEGATIVE
Glucose, UA: 150 mg/dL — AB
Ketones, ur: 20 mg/dL — AB
Nitrite: NEGATIVE
Protein, ur: NEGATIVE mg/dL
Specific Gravity, Urine: 1.013 (ref 1.005–1.030)
pH: 7 (ref 5.0–8.0)

## 2018-12-06 LAB — I-STAT TROPONIN, ED: Troponin i, poc: 0.01 ng/mL (ref 0.00–0.08)

## 2018-12-06 LAB — LIPASE, BLOOD: LIPASE: 23 U/L (ref 11–51)

## 2018-12-06 MED ORDER — ONDANSETRON HCL 4 MG/2ML IJ SOLN
4.0000 mg | Freq: Once | INTRAMUSCULAR | Status: AC
  Administered 2018-12-06: 4 mg via INTRAVENOUS
  Filled 2018-12-06: qty 2

## 2018-12-06 MED ORDER — MORPHINE SULFATE (PF) 2 MG/ML IV SOLN
2.0000 mg | Freq: Once | INTRAVENOUS | Status: AC
  Administered 2018-12-07: 2 mg via INTRAVENOUS
  Filled 2018-12-06: qty 1

## 2018-12-06 MED ORDER — ONDANSETRON HCL 4 MG/2ML IJ SOLN
4.0000 mg | Freq: Once | INTRAMUSCULAR | Status: AC
  Administered 2018-12-07: 4 mg via INTRAVENOUS
  Filled 2018-12-06: qty 2

## 2018-12-06 MED ORDER — SODIUM CHLORIDE 0.9 % IV BOLUS
1000.0000 mL | Freq: Once | INTRAVENOUS | Status: AC
  Administered 2018-12-06: 1000 mL via INTRAVENOUS

## 2018-12-06 MED ORDER — SODIUM CHLORIDE 0.9 % IV BOLUS
500.0000 mL | Freq: Once | INTRAVENOUS | Status: AC
  Administered 2018-12-07: 500 mL via INTRAVENOUS

## 2018-12-06 NOTE — ED Triage Notes (Signed)
Pt arrived by [facility] from [facility]  She hs been ill  For 2 hours

## 2018-12-06 NOTE — ED Provider Notes (Signed)
[facility] EMERGENCY DEPARTMENT Provider Note   CSN: [id number] Arrival date & time: 12/06/18  2123    History   Chief Complaint Chief Complaint  Patient presents with  . Abdominal Pain    HPI Jaime Mooney is a 85 y.o. female.     Patient c/o acute onset nausea, vomiting, and upper abd pain today. Patient states numerous episodes vomiting. Emesis color of recently ingested food/liquids, and not clear to yellowish, not bloody or bilious. Mild abd distension. Upper abd pain, moderate, cramping, nonradiating. Did have small loose bm earlier, no severe diarrhea. Remote hx cholecystectomy. Denies chest pain but did have some indigestion/heartburn. No sob. No fever or chills. No known bad food ingestion or ill contacts.   The history is provided by the patient, a relative and the EMS personnel.  Abdominal Pain  Associated symptoms: nausea and vomiting   Associated symptoms: no chest pain, no cough, no dysuria, no fever, no shortness of breath and no sore throat     Past Medical History:  Diagnosis Date  . Allergy   . Arthritis   . Diabetes mellitus type 2, uncomplicated ([place]) 1/93/7902  . Gallstones 2010  . GERD (gastroesophageal reflux disease)   . HLD (hyperlipidemia)   . HTN (hypertension)   . NSTEMI (non-ST elevated myocardial infarction) ([place])   . PVC (premature ventricular contraction)   . Vision abnormalities     Patient Active Problem List   Diagnosis Date Noted  . Alzheimer disease ([place]) 10/19/2017  . Elevated troponin   . Other forms of angina pectoris ([place])   . Chest pain 11/01/2016  . Non-STEMI (non-ST elevated myocardial infarction) ([place]) 11/01/2016  . Dyspnea 11/01/2016  . Type 2 diabetes mellitus without complication, with long-term current use of insulin ([place]) 06/19/2016  . Abnormal EKG 11/15/2014  . PVC (premature ventricular contraction)   . Urinary incontinence 12/02/2013  . Left hip pain 12/02/2013  . Left shoulder pain  12/02/2013  . Scoliosis 12/02/2013  . Allergic rhinitis due to pollen 12/02/2013    Past Surgical History:  Procedure Laterality Date  . BREAST SURGERY    . CARDIAC CATHETERIZATION N/A 11/02/2016   Procedure: Left Heart Cath and Coronary Angiography;  Surgeon: Belva Crome, MD;  Location: [facility];  Service: Cardiovascular;  Laterality: N/A;  . CHOLECYSTECTOMY    . EYE SURGERY     cataracts bilateral   . FRACTURE SURGERY    . gallbladder surgery 5 years ago    . left breast surgery 20 yrs ago    . nose fracture 16 yeras ago    . right breast surgery 2012       OB History   No obstetric history on file.      Home Medications    Prior to Admission medications   Medication Sig Start Date End Date Taking? Authorizing Provider  ALPRAZolam (XANAX) 0.25 MG tablet Take 1 tablet (0.25 mg total) by mouth 3 (three) times daily as needed for anxiety. 12/04/18   Kathrynn Ducking, MD  calcium elemental as carbonate (TUMS ULTRA 1000) 400 MG chewable tablet Chew 1,000 mg by mouth at bedtime.     [provider]  nystatin cream (MYCOSTATIN) Apply 1 application topically as directed. 10/08/18   [provider]  TURMERIC PO Take 1 capsule by mouth daily.    [provider]    Family History Family History  Problem Relation Age of Onset  . Esophageal cancer Mother  mets  . Diabetes Father   . Heart disease Father   . Stroke Father   . Prostate cancer Father   . Skin cancer Sister   . Heart disease Maternal Grandmother   . Heart disease Maternal Grandfather   . Heart disease Paternal Grandmother   . Diabetes Paternal Grandfather     Social History Social History   Tobacco Use  . Smoking status: Former Research scientist (life sciences)  . Smokeless tobacco: Never Used  Substance Use Topics  . Alcohol use: Yes    Comment: occasional 1-2 times per month  . Drug use: No     Allergies   Acetaminophen; Lisinopril; Norvasc [amlodipine besylate]; Prednisone; and  Epinephrine   Review of Systems Review of Systems  Constitutional: Negative for fever.  HENT: Negative for sore throat.   Eyes: Negative for redness.  Respiratory: Negative for cough and shortness of breath.   Cardiovascular: Negative for chest pain.  Gastrointestinal: Positive for abdominal pain, nausea and vomiting.  Endocrine: Negative for polyuria.  Genitourinary: Negative for dysuria and flank pain.  Musculoskeletal: Negative for back pain and neck pain.  Skin: Negative for rash.  Neurological: Negative for headaches.  Hematological: Does not bruise/bleed easily.  Psychiatric/Behavioral: Negative for confusion.     Physical Exam Updated Vital Signs BP (!) 152/59 (BP Location: Right Arm)   Pulse (!) 101   Temp 98 F (36.7 C) (Oral)   Resp (!) 23   SpO2 93%   Physical Exam Vitals signs and nursing note reviewed.  Constitutional:      Appearance: Normal appearance. She is well-developed.  HENT:     Head: Atraumatic.     Nose: Nose normal.     Mouth/Throat:     Mouth: Mucous membranes are moist.  Eyes:     General: No scleral icterus.    Conjunctiva/sclera: Conjunctivae normal.     Pupils: Pupils are equal, round, and reactive to light.  Neck:     Musculoskeletal: Normal range of motion and neck supple. No neck rigidity or muscular tenderness.     Trachea: No tracheal deviation.  Cardiovascular:     Rate and Rhythm: Normal rate and regular rhythm.     Pulses: Normal pulses.     Heart sounds: Normal heart sounds. No murmur. No friction rub. No gallop.   Pulmonary:     Effort: Pulmonary effort is normal. No respiratory distress.     Breath sounds: Normal breath sounds.  Abdominal:     General: Bowel sounds are normal. There is distension.     Palpations: Abdomen is soft. There is no mass.     Tenderness: There is abdominal tenderness. There is no guarding or rebound.     Hernia: No hernia is present.     Comments: Mild distention. Upper abd tenderness.     Genitourinary:    Comments: No cva tenderness.  Musculoskeletal:        General: No swelling.  Skin:    General: Skin is warm and dry.     Findings: No rash.  Neurological:     Mental Status: She is alert.     Comments: Alert, speech normal.   Psychiatric:        Mood and Affect: Mood normal.      ED Treatments / Results  Labs (all labs ordered are listed, but only abnormal results are displayed) Results for orders placed or performed during the hospital encounter of 12/06/18  CBC  Result Value Ref Range   WBC 13.1 (  H) 4.0 - 10.5 K/uL   RBC 4.43 3.87 - 5.11 MIL/uL   Hemoglobin 14.0 12.0 - 15.0 g/dL   HCT 44.0 36.0 - 46.0 %   MCV 99.3 80.0 - 100.0 fL   MCH 31.6 26.0 - 34.0 pg   MCHC 31.8 30.0 - 36.0 g/dL   RDW 11.9 11.5 - 15.5 %   Platelets 189 150 - 400 K/uL   nRBC 0.0 0.0 - 0.2 %  Comprehensive metabolic panel  Result Value Ref Range   Sodium 140 135 - 145 mmol/L   Potassium 4.1 3.5 - 5.1 mmol/L   Chloride 103 98 - 111 mmol/L   CO2 23 22 - 32 mmol/L   Glucose, Bld 210 (H) 70 - 99 mg/dL   BUN 24 (H) 8 - 23 mg/dL   Creatinine, Ser 0.65 0.44 - 1.00 mg/dL   Calcium 8.9 8.9 - 10.3 mg/dL   Total Protein 7.2 6.5 - 8.1 g/dL   Albumin 3.8 3.5 - 5.0 g/dL   AST 22 15 - 41 U/L   ALT 20 0 - 44 U/L   Alkaline Phosphatase 76 38 - 126 U/L   Total Bilirubin 0.9 0.3 - 1.2 mg/dL   GFR calc non Af Amer >60 >60 mL/min   GFR calc Af Amer >60 >60 mL/min   Anion gap 14 5 - 15  Lipase, blood  Result Value Ref Range   Lipase 23 11 - 51 U/L  I-stat troponin, ED  Result Value Ref Range   Troponin i, poc 0.01 0.00 - 0.08 ng/mL   Comment 3           Dg Abd Acute 2+v W 1v Chest  Result Date: 12/06/2018 CLINICAL DATA:  Abdominal pain. EXAM: DG ABDOMEN ACUTE W/ 1V CHEST COMPARISON:  October 31, 2016 FINDINGS: Scoliotic curvature of the thoracic spine. No pneumothorax. The cardiomediastinal silhouette is stable. Scar/atelectasis in the left base, unchanged. The lungs are otherwise clear.  No free air, portal venous gas, or pneumatosis. No bowel obstruction. IMPRESSION: Negative abdominal radiographs.  No acute cardiopulmonary disease. Electronically Signed   By: Dorise Bullion III M.D   On: 12/06/2018 23:00    EKG EKG Interpretation  Date/Time:  Saturday December 06 2018 21:33:38 EST Ventricular Rate:  101 PR Interval:    QRS Duration: 92 QT Interval:  360 QTC Calculation: 467 R Axis:   2 Text Interpretation:  Sinus tachycardia Ventricular premature complex Probable LVH with secondary repol abnrm Nonspecific ST abnormality Confirmed by Lajean Saver [phone]) on 12/06/2018 9:40:51 PM   Radiology Dg Abd Acute 2+v W 1v Chest  Result Date: 12/06/2018 CLINICAL DATA:  Abdominal pain. EXAM: DG ABDOMEN ACUTE W/ 1V CHEST COMPARISON:  October 31, 2016 FINDINGS: Scoliotic curvature of the thoracic spine. No pneumothorax. The cardiomediastinal silhouette is stable. Scar/atelectasis in the left base, unchanged. The lungs are otherwise clear. No free air, portal venous gas, or pneumatosis. No bowel obstruction. IMPRESSION: Negative abdominal radiographs.  No acute cardiopulmonary disease. Electronically Signed   By: Dorise Bullion III M.D   On: 12/06/2018 23:00    Procedures Procedures (including critical care time)  Medications Ordered in ED Medications  ondansetron [facility]) injection 4 mg (has no administration in time range)  sodium chloride 0.9 % bolus 1,000 mL (1,000 mLs Intravenous New Bag/Given 12/06/18 2206)     Initial Impression / Assessment and Plan / ED Course  I have reviewed the triage vital signs and the nursing notes.  Pertinent labs & imaging results that  were available during my care of the patient were reviewed by me and considered in my medical decision making (see chart for details).  Iv ns bolus. zofran iv. Stat labs. Imaging.   Reviewed nursing notes and prior charts for additional history.   Labs reviewed - chem normal.   abd xrays reviewed - no acute  sbo.  Persistent nausea, and abd pain. Persistent mid/diffuse abd tenderness with palpation. Will get CT. Additional ivf.  Morphine 2 mg iv. zofran iv.   Signed out to Dr Leonides Schanz to check CT when resulted, recheck pt, and dispo appropriately.      Final Clinical Impressions(s) / ED Diagnoses   Final diagnoses:  None    ED Discharge Orders    None       Lajean Saver, MD 12/06/18 2327

## 2018-12-07 ENCOUNTER — Emergency Department (HOSPITAL_COMMUNITY): Payer: Medicare Other

## 2018-12-07 ENCOUNTER — Other Ambulatory Visit: Payer: Self-pay

## 2018-12-07 ENCOUNTER — Encounter (HOSPITAL_COMMUNITY): Payer: Self-pay | Admitting: *Deleted

## 2018-12-07 DIAGNOSIS — R112 Nausea with vomiting, unspecified: Secondary | ICD-10-CM | POA: Diagnosis not present

## 2018-12-07 DIAGNOSIS — K529 Noninfective gastroenteritis and colitis, unspecified: Secondary | ICD-10-CM | POA: Diagnosis present

## 2018-12-07 DIAGNOSIS — K5730 Diverticulosis of large intestine without perforation or abscess without bleeding: Secondary | ICD-10-CM | POA: Diagnosis not present

## 2018-12-07 LAB — MRSA PCR SCREENING: MRSA by PCR: NEGATIVE

## 2018-12-07 LAB — GLUCOSE, CAPILLARY
GLUCOSE-CAPILLARY: 94 mg/dL (ref 70–99)
Glucose-Capillary: 167 mg/dL — ABNORMAL HIGH (ref 70–99)
Glucose-Capillary: 110 mg/dL — ABNORMAL HIGH (ref 70–99)
Glucose-Capillary: 123 mg/dL — ABNORMAL HIGH (ref 70–99)

## 2018-12-07 LAB — CBG MONITORING, ED: Glucose-Capillary: 241 mg/dL — ABNORMAL HIGH (ref 70–99)

## 2018-12-07 MED ORDER — ENOXAPARIN SODIUM 40 MG/0.4ML ~~LOC~~ SOLN
40.0000 mg | SUBCUTANEOUS | Status: DC
  Administered 2018-12-07 – 2018-12-09 (×3): 40 mg via SUBCUTANEOUS
  Filled 2018-12-07 (×4): qty 0.4

## 2018-12-07 MED ORDER — ONDANSETRON HCL 4 MG/2ML IJ SOLN: 4.0000 mg | Freq: Four times a day (QID) | INTRAMUSCULAR | Status: DC | PRN

## 2018-12-07 MED ORDER — FAMOTIDINE IN NACL 20-0.9 MG/50ML-% IV SOLN
20.0000 mg | Freq: Once | INTRAVENOUS | Status: AC
  Administered 2018-12-07: 20 mg via INTRAVENOUS
  Filled 2018-12-07: qty 50

## 2018-12-07 MED ORDER — IOHEXOL 300 MG/ML  SOLN
100.0000 mL | Freq: Once | INTRAMUSCULAR | Status: AC | PRN
  Administered 2018-12-07: 100 mL via INTRAVENOUS

## 2018-12-07 MED ORDER — MORPHINE SULFATE (PF) 2 MG/ML IV SOLN: 0.5000 mg | INTRAVENOUS | Status: DC | PRN

## 2018-12-07 MED ORDER — MORPHINE SULFATE (PF) 2 MG/ML IV SOLN: 1.0000 mg | INTRAVENOUS | Status: DC | PRN

## 2018-12-07 MED ORDER — INSULIN ASPART 100 UNIT/ML ~~LOC~~ SOLN
0.0000 [IU] | Freq: Three times a day (TID) | SUBCUTANEOUS | Status: DC
  Administered 2018-12-08 – 2018-12-09 (×2): 1 [IU] via SUBCUTANEOUS

## 2018-12-07 MED ORDER — PROMETHAZINE HCL 25 MG/ML IJ SOLN: 12.5000 mg | Freq: Once | INTRAMUSCULAR | Status: DC

## 2018-12-07 MED ORDER — SODIUM CHLORIDE 0.9 % IV SOLN
INTRAVENOUS | Status: DC
  Administered 2018-12-07: 07:00:00 via INTRAVENOUS

## 2018-12-07 MED ORDER — SODIUM CHLORIDE 0.9 % IV SOLN
INTRAVENOUS | Status: AC
  Administered 2018-12-07: 13:00:00 via INTRAVENOUS

## 2018-12-07 MED ORDER — INSULIN ASPART 100 UNIT/ML ~~LOC~~ SOLN
0.0000 [IU] | SUBCUTANEOUS | Status: DC
  Administered 2018-12-07: 2 [IU] via SUBCUTANEOUS
  Administered 2018-12-07: 3 [IU] via SUBCUTANEOUS

## 2018-12-07 MED ORDER — ALPRAZOLAM 0.25 MG PO TABS
0.2500 mg | ORAL_TABLET | Freq: Two times a day (BID) | ORAL | Status: DC | PRN
  Administered 2018-12-09: 0.25 mg via ORAL
  Filled 2018-12-07: qty 1

## 2018-12-07 NOTE — Progress Notes (Signed)
Received from ED via stretcher. Daughter with patient. VS as charted.

## 2018-12-07 NOTE — Plan of Care (Signed)

## 2018-12-07 NOTE — ED Notes (Signed)
Purewick placed on pt. 

## 2018-12-07 NOTE — H&P (Signed)
History and Physical    Jaime Mooney UJW:[id number] DOB: 1929-03-28 DOA: 12/06/2018  Referring MD/NP/PA: EDP PCP:  Patient coming from: [facility] assisted living  Chief Complaint: Abdominal pain nausea vomiting  HPI: Jaime Mooney is a 85 y.o. female with medical history significant of Alzheimer's dementia, diet-controlled diabetes, anxiety, resident of assisted living facility was brought to the emergency room with abdominal pain nausea vomiting and diarrhea since last p.m. -Patient is a very poor historian, reported history of sick contacts at ALF with similar GI complaints. -Failed p.o. trial in the emergency room hence admission/observation requested per [facility] ED Course: Stable vital signs, while mild leukocytosis white count of 13,000, CT abdomen pelvis without acute findings  Review of Systems: Unable to obtain due to dementia  Past Medical History:  Diagnosis Date  . Allergy   . Arthritis   . Diabetes mellitus type 2, uncomplicated ([place]) 5/62/1308  . Gallstones 2010  . GERD (gastroesophageal reflux disease)   . HLD (hyperlipidemia)   . HTN (hypertension)   . NSTEMI (non-ST elevated myocardial infarction) ([place])   . PVC (premature ventricular contraction)   . Vision abnormalities     Past Surgical History:  Procedure Laterality Date  . BREAST SURGERY    . CARDIAC CATHETERIZATION N/A 11/02/2016   Procedure: Left Heart Cath and Coronary Angiography;  Surgeon: Belva Crome, MD;  Location: [facility];  Service: Cardiovascular;  Laterality: N/A;  . CHOLECYSTECTOMY    . EYE SURGERY     cataracts bilateral   . FRACTURE SURGERY    . gallbladder surgery 5 years ago    . left breast surgery 20 yrs ago    . nose fracture 16 yeras ago    . right breast surgery 2012       reports that she has quit smoking. She has never used smokeless tobacco. She reports current alcohol use. She reports that she does not use drugs.  Allergies  Allergen Reactions  . Acetaminophen  Swelling    Patient doesn't recall site of swelling (??)  . Lisinopril Swelling and Other (See Comments)    Facial swelling and tongue swelling  . Norvasc [Amlodipine Besylate] Swelling    Patient doesn't recall site of swelling (??)  . Prednisone Swelling and Other (See Comments)    Facial swelling, can take in small doses  . Epinephrine Palpitations    Family History  Problem Relation Age of Onset  . Esophageal cancer Mother        mets  . Diabetes Father   . Heart disease Father   . Stroke Father   . Prostate cancer Father   . Skin cancer Sister   . Parkinson's disease Sister   . Heart disease Maternal Grandmother   . Heart disease Maternal Grandfather   . Heart disease Paternal Grandmother   . Diabetes Paternal Grandfather      Prior to Admission medications   Medication Sig Start Date End Date Taking? Authorizing Provider  ALPRAZolam (XANAX) 0.25 MG tablet Take 1 tablet (0.25 mg total) by mouth 3 (three) times daily as needed for anxiety. 12/04/18   Kathrynn Ducking, MD  calcium elemental as carbonate (TUMS ULTRA 1000) 400 MG chewable tablet Chew 1,000 mg by mouth at bedtime.     [provider]  nystatin cream (MYCOSTATIN) Apply 1 application topically as directed. 10/08/18   [provider]  TURMERIC PO Take 1 capsule by mouth daily.    [provider]  Physical Exam: Vitals:   12/07/18 0145 12/07/18 0413 12/07/18 0441 12/07/18 0840  BP: 127/78  (!) 165/71 (!) 106/56  Pulse: 95  (!) 101 84  Resp: 19  15 16   Temp:  98.5 F (36.9 C) 98.8 F (37.1 C) 98.2 F (36.8 C)  TempSrc:   Oral Axillary  SpO2: 94%  96% 99%  Weight:   60.9 kg   Height:   5' (1.524 m)       Constitutional: Frail elderly female, sitting up in bed, no distress Vitals:   12/07/18 0145 12/07/18 0413 12/07/18 0441 12/07/18 0840  BP: 127/78  (!) 165/71 (!) 106/56  Pulse: 95  (!) 101 84  Resp: 19  15 16   Temp:  98.5 F (36.9 C) 98.8 F (37.1 C) 98.2 F (36.8 C)   TempSrc:   Oral Axillary  SpO2: 94%  96% 99%  Weight:   60.9 kg   Height:   5' (1.524 m)    Eyes: PERRL, lids and conjunctivae normal ENMT: Dry mucous membranes Neck: normal, supple Respiratory: Clear bilaterally respiratory effort. No accessory muscle use.  Cardiovascular: Regular rate and rhythm, no murmurs / rubs / gallops Abdomen: Soft, mildly distended, nontender, bowel sounds present Musculoskeletal: No joint deformity upper and lower extremities. Ext: No edema Skin: no rashes, lesions, ulcers.  Neurologic: Moves all extremities, no localizing signs Psychiatric: Flat affect, awake alert oriented to self only  Labs on Admission: I have personally reviewed following labs and imaging studies  CBC: Recent Labs  Lab 12/06/18 2220  WBC 13.1*  HGB 14.0  HCT 44.0  MCV 99.3  PLT 161   Basic Metabolic Panel: Recent Labs  Lab 12/06/18 2220  NA 140  K 4.1  CL 103  CO2 23  GLUCOSE 210*  BUN 24*  CREATININE 0.65  CALCIUM 8.9   GFR: Estimated Creatinine Clearance: 39.7 mL/min (by C-G formula based on SCr of 0.65 mg/dL). Liver Function Tests: Recent Labs  Lab 12/06/18 2220  AST 22  ALT 20  ALKPHOS 76  BILITOT 0.9  PROT 7.2  ALBUMIN 3.8   Recent Labs  Lab 12/06/18 2220  LIPASE 23   No results for input(s): AMMONIA in the last 168 hours. Coagulation Profile: No results for input(s): INR, PROTIME in the last 168 hours. Cardiac Enzymes: No results for input(s): CKTOTAL, CKMB, CKMBINDEX, TROPONINI in the last 168 hours. BNP (last 3 results) No results for input(s): PROBNP in the last 8760 hours. HbA1C: No results for input(s): HGBA1C in the last 72 hours. CBG: Recent Labs  Lab 12/07/18 0404 12/07/18 0749  GLUCAP 241* 167*   Lipid Profile: No results for input(s): CHOL, HDL, LDLCALC, TRIG, CHOLHDL, LDLDIRECT in the last 72 hours. Thyroid Function Tests: No results for input(s): TSH, T4TOTAL, FREET4, T3FREE, THYROIDAB in the last 72 hours. Anemia  Panel: No results for input(s): VITAMINB12, FOLATE, FERRITIN, TIBC, IRON, RETICCTPCT in the last 72 hours. Urine analysis:    Component Value Date/Time   COLORURINE STRAW (A) 12/06/2018 2326   APPEARANCEUR CLEAR 12/06/2018 2326   LABSPEC 1.013 12/06/2018 2326   PHURINE 7.0 12/06/2018 2326   GLUCOSEU 150 (A) 12/06/2018 2326   HGBUR SMALL (A) 12/06/2018 2326   BILIRUBINUR NEGATIVE 12/06/2018 2326   BILIRUBINUR n 05/05/2018 1019   KETONESUR 20 (A) 12/06/2018 2326   PROTEINUR NEGATIVE 12/06/2018 2326   UROBILINOGEN negative (A) 05/05/2018 1019   NITRITE NEGATIVE 12/06/2018 2326   LEUKOCYTESUR SMALL (A) 12/06/2018 2326   Sepsis Labs: @LABRCNTIP (procalcitonin:4,lacticidven:4) ) Recent  Results (from the past 240 hour(s))  MRSA PCR Screening     Status: None   Collection Time: 12/07/18  5:10 AM  Result Value Ref Range Status   MRSA by PCR NEGATIVE NEGATIVE Final    Comment:        The GeneXpert MRSA Assay (FDA approved for NASAL specimens only), is one component of a comprehensive MRSA colonization surveillance program. It is not intended to diagnose MRSA infection nor to guide or monitor treatment for MRSA infections. Performed at [facility], [place] [address], [place], [place] [address]      Radiological Exams on Admission: Ct Abdomen Pelvis W Contrast  Result Date: 12/07/2018 CLINICAL DATA:  Acute abdominal pain. Nausea. EXAM: CT ABDOMEN AND PELVIS WITH CONTRAST TECHNIQUE: Multidetector CT imaging of the abdomen and pelvis was performed using the standard protocol following bolus administration of intravenous contrast. CONTRAST:  168mL OMNIPAQUE IOHEXOL 300 MG/ML  SOLN COMPARISON:  Radiograph earlier this day. CT 06/19/2016 FINDINGS: Lower chest: Scoliotic curvature distorts normal anatomy. Bilateral lower lobe atelectasis. No pleural fluid. Hepatobiliary: Calcified granuloma without focal lesion. Clips in the gallbladder fossa postcholecystectomy. No biliary dilatation.  Pancreas: Parenchymal atrophy. No ductal dilatation or inflammation. Spleen: Calcified granuloma. Adrenals/Urinary Tract: Normal adrenal glands. No hydronephrosis or perinephric edema. Homogeneous renal enhancement with symmetric excretion on delayed phase imaging. Simple cysts in the upper and mid left kidney. Urinary bladder is physiologically distended without wall thickening. Stomach/Bowel: Small hiatal hernia. Stomach is partially distended. Motion artifact limits detailed assessment. No bowel wall thickening or inflammatory change. No evidence of obstruction. Normal appendix. Moderate stool burden in the colon. Mild diverticulosis of the distal colon without diverticulitis. Vascular/Lymphatic: Prominent left adnexal vascularity. Dilatation of the left ovarian vein at 8 mm. Aortic atherosclerosis. Portal vein and mesenteric vessels are patent. No adenopathy. Reproductive: Prominent left adnexal vascularity and dilatation of the ovarian pain. Coarse calcification in the right adnexa, unchanged. Uterus is unremarkable. Other: Laxity of the anterior abdominal wall musculature. No free air, free fluid, or intra-abdominal fluid collection. Musculoskeletal: Scoliosis and degenerative change throughout the spine. There are no acute or suspicious osseous abnormalities. IMPRESSION: 1. No acute findings. 2. Prominent left adnexal vascularity and dilatation of the ovarian vein, can be seen with pelvic congestion syndrome. 3. Mild distal colonic diverticulosis without diverticulitis. 4.  Aortic Atherosclerosis (ICD10-I70.0). Electronically Signed   By: Keith Rake M.D.   On: 12/07/2018 00:39   Dg Abd Acute 2+v W 1v Chest  Result Date: 12/06/2018 CLINICAL DATA:  Abdominal pain. EXAM: DG ABDOMEN ACUTE W/ 1V CHEST COMPARISON:  October 31, 2016 FINDINGS: Scoliotic curvature of the thoracic spine. No pneumothorax. The cardiomediastinal silhouette is stable. Scar/atelectasis in the left base, unchanged. The lungs are  otherwise clear. No free air, portal venous gas, or pneumatosis. No bowel obstruction. IMPRESSION: Negative abdominal radiographs.  No acute cardiopulmonary disease. Electronically Signed   By: Dorise Bullion III M.D   On: 12/06/2018 23:00    EKG: Independently reviewed.  Sinus rhythm, LVH with repolarization abnormality  Assessment/Plan Principal Problem:    Intractable nausea and vomiting -With report of nausea vomiting and diarrhea and sick contacts suspect viral gastroenteritis versus food poisoning -Appears to be improving, no further vomiting this morning -CT abdomen pelvis without any acute findings -Supportive care, gentle IV fluids, clear liquid diet, antiemetics, advance as tolerated -If diarrhea recurs will check GI pathogen panel    Type 2 diabetes mellitus without complication, with long-term current use of insulin (HCC) -Controlled,  sensitive sliding scale for now    Alzheimer disease ([place]) -Stable    Anxiety -recently started on low-dose Xanax as needed for anxiety  DVT prophylaxis: lovenox Code Status: Full code Family Communication:  no family at bedside Disposition Plan: Back to assisted living tomorrow if stable Consults called: None Admission status: Observation  Domenic Polite MD [facility]  12/07/2018, 10:16 AM

## 2018-12-07 NOTE — ED Notes (Signed)
Unable to do fluid challenge  Pt too nauseated still

## 2018-12-07 NOTE — ED Notes (Signed)
Pt nauseatedd attgempting to vomit

## 2018-12-07 NOTE — ED Provider Notes (Signed)
12:00 AM  Assumed care from Dr. Ashok Cordia.  Patient is a 85 y.o. female who presents to the emergency department with generalized abdominal pain, nausea and vomiting that started today.  Has a leukocytosis of 13,000.  Otherwise labs unremarkable.  Urine shows no obvious sign of infection.  Abdominal x-ray unremarkable.  CT of the abdomen pelvis pending.  1:10 AM  Pt's CT of abdomen pelvis shows no acute findings.  She reports her abdominal pain and nausea have improved.  She is not having any diarrhea.  Will p.o. challenge patient.  If she continues to do well, anticipate discharge back to her nursing facility.  She is comfortable with this plan.  1:45 AM  Pt began vomiting in after only a few sips of water.  She has received 2 doses of Zofran.  Will give dose of IV Phenergan.  Will admit for intractable vomiting.  3:12 AM Discussed patient's case with hospitalist, Dr. Myna Hidalgo.  I have recommended admission and patient (and family if present) agree with this plan. Admitting physician will place admission orders.   I reviewed all nursing notes, vitals, pertinent previous records, EKGs, lab and urine results, imaging (as available).     Amilio Zehnder, Delice Bison, DO 12/07/18 [phone]

## 2018-12-08 ENCOUNTER — Encounter (HOSPITAL_COMMUNITY): Payer: Self-pay | Admitting: Internal Medicine

## 2018-12-08 DIAGNOSIS — E119 Type 2 diabetes mellitus without complications: Secondary | ICD-10-CM

## 2018-12-08 DIAGNOSIS — I10 Essential (primary) hypertension: Secondary | ICD-10-CM | POA: Diagnosis present

## 2018-12-08 DIAGNOSIS — K5900 Constipation, unspecified: Secondary | ICD-10-CM | POA: Diagnosis present

## 2018-12-08 DIAGNOSIS — K529 Noninfective gastroenteritis and colitis, unspecified: Principal | ICD-10-CM

## 2018-12-08 DIAGNOSIS — F0280 Dementia in other diseases classified elsewhere without behavioral disturbance: Secondary | ICD-10-CM

## 2018-12-08 DIAGNOSIS — Z794 Long term (current) use of insulin: Secondary | ICD-10-CM | POA: Diagnosis not present

## 2018-12-08 DIAGNOSIS — K5790 Diverticulosis of intestine, part unspecified, without perforation or abscess without bleeding: Secondary | ICD-10-CM | POA: Diagnosis not present

## 2018-12-08 DIAGNOSIS — G301 Alzheimer's disease with late onset: Secondary | ICD-10-CM | POA: Diagnosis not present

## 2018-12-08 DIAGNOSIS — Z79899 Other long term (current) drug therapy: Secondary | ICD-10-CM | POA: Diagnosis not present

## 2018-12-08 DIAGNOSIS — I491 Atrial premature depolarization: Secondary | ICD-10-CM | POA: Diagnosis present

## 2018-12-08 DIAGNOSIS — R112 Nausea with vomiting, unspecified: Secondary | ICD-10-CM | POA: Diagnosis not present

## 2018-12-08 DIAGNOSIS — R0789 Other chest pain: Secondary | ICD-10-CM | POA: Diagnosis present

## 2018-12-08 DIAGNOSIS — K449 Diaphragmatic hernia without obstruction or gangrene: Secondary | ICD-10-CM

## 2018-12-08 DIAGNOSIS — Z6825 Body mass index (BMI) 25.0-25.9, adult: Secondary | ICD-10-CM | POA: Diagnosis not present

## 2018-12-08 DIAGNOSIS — R4701 Aphasia: Secondary | ICD-10-CM | POA: Diagnosis present

## 2018-12-08 DIAGNOSIS — Z7401 Bed confinement status: Secondary | ICD-10-CM | POA: Diagnosis not present

## 2018-12-08 DIAGNOSIS — Z8249 Family history of ischemic heart disease and other diseases of the circulatory system: Secondary | ICD-10-CM | POA: Diagnosis not present

## 2018-12-08 DIAGNOSIS — I2510 Atherosclerotic heart disease of native coronary artery without angina pectoris: Secondary | ICD-10-CM | POA: Diagnosis present

## 2018-12-08 DIAGNOSIS — G309 Alzheimer's disease, unspecified: Secondary | ICD-10-CM | POA: Diagnosis present

## 2018-12-08 DIAGNOSIS — F419 Anxiety disorder, unspecified: Secondary | ICD-10-CM | POA: Diagnosis present

## 2018-12-08 DIAGNOSIS — R410 Disorientation, unspecified: Secondary | ICD-10-CM | POA: Diagnosis not present

## 2018-12-08 DIAGNOSIS — R627 Adult failure to thrive: Secondary | ICD-10-CM | POA: Diagnosis present

## 2018-12-08 DIAGNOSIS — Z833 Family history of diabetes mellitus: Secondary | ICD-10-CM | POA: Diagnosis not present

## 2018-12-08 DIAGNOSIS — Z888 Allergy status to other drugs, medicaments and biological substances status: Secondary | ICD-10-CM | POA: Diagnosis not present

## 2018-12-08 DIAGNOSIS — M2550 Pain in unspecified joint: Secondary | ICD-10-CM | POA: Diagnosis not present

## 2018-12-08 LAB — CBC
HCT: 38.3 % (ref 36.0–46.0)
Hemoglobin: 12.5 g/dL (ref 12.0–15.0)
MCH: 32.3 pg (ref 26.0–34.0)
MCHC: 32.6 g/dL (ref 30.0–36.0)
MCV: 99 fL (ref 80.0–100.0)
Platelets: 167 10*3/uL (ref 150–400)
RBC: 3.87 MIL/uL (ref 3.87–5.11)
RDW: 12.4 % (ref 11.5–15.5)
WBC: 7.7 10*3/uL (ref 4.0–10.5)
nRBC: 0 % (ref 0.0–0.2)

## 2018-12-08 LAB — GLUCOSE, CAPILLARY
Glucose-Capillary: 124 mg/dL — ABNORMAL HIGH (ref 70–99)
Glucose-Capillary: 112 mg/dL — ABNORMAL HIGH (ref 70–99)
Glucose-Capillary: 101 mg/dL — ABNORMAL HIGH (ref 70–99)
Glucose-Capillary: 112 mg/dL — ABNORMAL HIGH (ref 70–99)

## 2018-12-08 LAB — BASIC METABOLIC PANEL
Anion gap: 10 (ref 5–15)
BUN: 20 mg/dL (ref 8–23)
CO2: 22 mmol/L (ref 22–32)
Calcium: 7.9 mg/dL — ABNORMAL LOW (ref 8.9–10.3)
Chloride: 105 mmol/L (ref 98–111)
Creatinine, Ser: 0.61 mg/dL (ref 0.44–1.00)
GFR calc Af Amer: >60 mL/min (ref >60)
Glucose, Bld: 131 mg/dL — ABNORMAL HIGH (ref 70–99)
Potassium: 3.6 mmol/L (ref 3.5–5.1)
Sodium: 137 mmol/L (ref 135–145)

## 2018-12-08 LAB — HEMOGLOBIN A1C
Hgb A1c MFr Bld: 7.2 % — ABNORMAL HIGH (ref 4.8–5.6)
Mean Plasma Glucose: 159.94 mg/dL

## 2018-12-08 MED ORDER — MAGNESIUM HYDROXIDE 400 MG/5ML PO SUSP
30.0000 mL | Freq: Every day | ORAL | Status: AC
  Administered 2018-12-08 – 2018-12-09 (×2): 30 mL via ORAL
  Filled 2018-12-08 (×2): qty 30

## 2018-12-08 MED ORDER — ONDANSETRON HCL 4 MG/2ML IJ SOLN
4.0000 mg | INTRAMUSCULAR | Status: AC
  Administered 2018-12-08 (×4): 4 mg via INTRAVENOUS
  Filled 2018-12-08 (×5): qty 2

## 2018-12-08 MED ORDER — SODIUM CHLORIDE 0.9 % IV SOLN
INTRAVENOUS | Status: AC
  Administered 2018-12-08 – 2018-12-09 (×2): via INTRAVENOUS

## 2018-12-08 MED ORDER — DOCUSATE SODIUM 100 MG PO CAPS
100.0000 mg | ORAL_CAPSULE | Freq: Two times a day (BID) | ORAL | Status: AC
  Administered 2018-12-08 – 2018-12-09 (×4): 100 mg via ORAL
  Filled 2018-12-08 (×4): qty 1

## 2018-12-08 NOTE — Progress Notes (Addendum)
[facility] PROGRESS NOTE  Jaime Mooney:[id number] DOB: [birth year] DOA: 12/06/2018 PCP: Rosina Lowenstein, FNP  Assessment/Plan:  Intractable nausea and vomiting With report of nausea vomiting and diarrhea and sick contacts suspect viral gastroenteritis versus food poisoning. Improved this am but unable to tolerate fluids. Denies abdominal pain but does endorse "bloating". CT of  abdomen pelvis without any acute findings, no obstruction but reveals moderate stool burden. -milk of mag daily for 2 days -colace bid -will provide scheduled IV zofran for 24 hours to manage lingering nausea. - continue supportive care in form of gentle IV fluids, clear liquid diet -If diarrhea recurs will check GI pathogen panel    Type 2 diabetes mellitus without complication, with long-term current use of insulin (HCC) serum glucose 210 on admission. Appears to be diet contrlloed -will obtain HgA1c -carb modified diet -. SSI for optimal control    Alzheimer disease ([place]) -Stable    Anxiety -recently started on low-dose Xanax as needed for anxiety  Code Status: full Family Communication: none present Disposition Plan: back fo facility   Consultants:    Procedures:    Antibiotics:    HPI/Subjective: 85 yo with progressive aphasia disorder with memory issues admitted for intractable nausea and vomiting.   Objective: Vitals:   12/08/18 0442 12/08/18 0827  BP: (!) 146/51 (!) 153/64  Pulse: 79 78  Resp: 16 18  Temp: 98.5 F (36.9 C) 98.2 F (36.8 C)  SpO2: 95% 94%    Intake/Output Summary (Last 24 hours) at 12/08/2018 1022 Last data filed at 12/08/2018 0600 Gross per 24 hour  Intake 1178.42 ml  Output 450 ml  Net 728.42 ml   Filed Weights   12/07/18 0441 12/07/18 2109  Weight: 60.9 kg 60.8 kg    Exam:   General:  Awake alert in no acute distress  Cardiovascular: rrr no mgr no Le edema  Respiratory: normal effort BS clear bilaterally no wheeze  Abdomen:  non-distended slightly firm no guarding or rebounding, +BS but sluggish  Musculoskeletal: joints without swelling/erythema   Data Reviewed: Basic Metabolic Panel: Recent Labs  Lab 12/06/18 2220 12/08/18 0511  NA 140 137  K 4.1 3.6  CL 103 105  CO2 23 22  GLUCOSE 210* 131*  BUN 24* 20  CREATININE 0.65 0.61  CALCIUM 8.9 7.9*   Liver Function Tests: Recent Labs  Lab 12/06/18 2220  AST 22  ALT 20  ALKPHOS 76  BILITOT 0.9  PROT 7.2  ALBUMIN 3.8   Recent Labs  Lab 12/06/18 2220  LIPASE 23   No results for input(s): AMMONIA in the last 168 hours. CBC: Recent Labs  Lab 12/06/18 2220 12/08/18 0511  WBC 13.1* 7.7  HGB 14.0 12.5  HCT 44.0 38.3  MCV 99.3 99.0  PLT 189 167   Cardiac Enzymes: No results for input(s): CKTOTAL, CKMB, CKMBINDEX, TROPONINI in the last 168 hours. BNP (last 3 results) No results for input(s): BNP in the last 8760 hours.  ProBNP (last 3 results) No results for input(s): PROBNP in the last 8760 hours.  CBG: Recent Labs  Lab 12/07/18 0749 12/07/18 1127 12/07/18 1633 12/07/18 2110 12/08/18 0716  GLUCAP 167* 110* 94 123* 124*    Recent Results (from the past 240 hour(s))  MRSA PCR Screening     Status: None   Collection Time: 12/07/18  5:10 AM  Result Value Ref Range Status   MRSA by PCR NEGATIVE NEGATIVE Final    Comment:        The  GeneXpert MRSA Assay (FDA approved for NASAL specimens only), is one component of a comprehensive MRSA colonization surveillance program. It is not intended to diagnose MRSA infection nor to guide or monitor treatment for MRSA infections. Performed at [facility], [place] [address] [place], [place] [address]      Studies: Ct Abdomen Pelvis W Contrast  Result Date: 12/07/2018 CLINICAL DATA:  Acute abdominal pain. Nausea. EXAM: CT ABDOMEN AND PELVIS WITH CONTRAST TECHNIQUE: Multidetector CT imaging of the abdomen and pelvis was performed using the standard protocol following bolus  administration of intravenous contrast. CONTRAST:  174mL OMNIPAQUE IOHEXOL 300 MG/ML  SOLN COMPARISON:  Radiograph earlier this day. CT 06/19/2016 FINDINGS: Lower chest: Scoliotic curvature distorts normal anatomy. Bilateral lower lobe atelectasis. No pleural fluid. Hepatobiliary: Calcified granuloma without focal lesion. Clips in the gallbladder fossa postcholecystectomy. No biliary dilatation. Pancreas: Parenchymal atrophy. No ductal dilatation or inflammation. Spleen: Calcified granuloma. Adrenals/Urinary Tract: Normal adrenal glands. No hydronephrosis or perinephric edema. Homogeneous renal enhancement with symmetric excretion on delayed phase imaging. Simple cysts in the upper and mid left kidney. Urinary bladder is physiologically distended without wall thickening. Stomach/Bowel: Small hiatal hernia. Stomach is partially distended. Motion artifact limits detailed assessment. No bowel wall thickening or inflammatory change. No evidence of obstruction. Normal appendix. Moderate stool burden in the colon. Mild diverticulosis of the distal colon without diverticulitis. Vascular/Lymphatic: Prominent left adnexal vascularity. Dilatation of the left ovarian vein at 8 mm. Aortic atherosclerosis. Portal vein and mesenteric vessels are patent. No adenopathy. Reproductive: Prominent left adnexal vascularity and dilatation of the ovarian pain. Coarse calcification in the right adnexa, unchanged. Uterus is unremarkable. Other: Laxity of the anterior abdominal wall musculature. No free air, free fluid, or intra-abdominal fluid collection. Musculoskeletal: Scoliosis and degenerative change throughout the spine. There are no acute or suspicious osseous abnormalities. IMPRESSION: 1. No acute findings. 2. Prominent left adnexal vascularity and dilatation of the ovarian vein, can be seen with pelvic congestion syndrome. 3. Mild distal colonic diverticulosis without diverticulitis. 4.  Aortic Atherosclerosis (ICD10-I70.0).  Electronically Signed   By: Keith Rake M.D.   On: 12/07/2018 00:39   Dg Abd Acute 2+v W 1v Chest  Result Date: 12/06/2018 CLINICAL DATA:  Abdominal pain. EXAM: DG ABDOMEN ACUTE W/ 1V CHEST COMPARISON:  October 31, 2016 FINDINGS: Scoliotic curvature of the thoracic spine. No pneumothorax. The cardiomediastinal silhouette is stable. Scar/atelectasis in the left base, unchanged. The lungs are otherwise clear. No free air, portal venous gas, or pneumatosis. No bowel obstruction. IMPRESSION: Negative abdominal radiographs.  No acute cardiopulmonary disease. Electronically Signed   By: Dorise Bullion III M.D   On: 12/06/2018 23:00    Scheduled Meds: . enoxaparin (LOVENOX) injection  40 mg Subcutaneous Q24H  . insulin aspart  0-9 Units Subcutaneous TID WC  . ondansetron (ZOFRAN) IV  4 mg Intravenous Q4H  . promethazine  12.5 mg Intravenous Once   Continuous Infusions: . sodium chloride      Principal Problem:   Intractable nausea and vomiting Active Problems:   Type 2 diabetes mellitus without complication, with long-term current use of insulin (HCC)   Alzheimer disease (HCC)   Gastroenteritis    Time spent: 19 minutes    [place] NP [facility]  If 7PM-7AM, please contact night-coverage at [email or web address], password [facility] 12/08/2018, 10:22 AM  LOS: 0 days

## 2018-12-08 NOTE — Evaluation (Signed)
Physical Therapy Evaluation Patient Details Name: Jaime Mooney MRN: [id number] DOB: 1929/04/23 Today's Date: 12/08/2018   History of Present Illness  85 yo admitted for intractable N/V with gastroenteritis from Jaime Mooney. PMhx: DM, Alzheimer's, anxiety, HTN, HLD  Clinical Impression  Pt an 85yo female admitted for above. Pt awake and in bed upon arrival and agreed to participate with therapy. Pt presents with confusion about the day and cognitive impairments consistent with PMH. Pt presents with decreased strength, cognition, and balance deficits that are limiting her ability to perform all functional mobility safely and return home. Pt would benefit from skilled therapy to improve function, safety and independence.     Follow Up Recommendations SNF;Supervision/Assistance - 24 hour(unless assisted living can provide home health)    Equipment Recommendations  Rolling walker with 5" wheels    Recommendations for Other Services       Precautions / Restrictions Precautions Precautions: Fall Restrictions Weight Bearing Restrictions: No      Mobility  Bed Mobility Overal bed mobility: Needs Assistance Bed Mobility: Supine to Sit     Supine to sit: HOB elevated;Min assist     General bed mobility comments: increased time to scoot EOB, min assist to fully square up and scoot to EOB, use of railings   Transfers Overall transfer level: Needs assistance Equipment used: 2 person hand held assist;Rolling walker (2 wheeled) Transfers: Sit to/from Stand Sit to Stand: Min assist         General transfer comment: initial sit to stand from bed with 2 person HHA for balance, pt then given RW due to unsteadiness upon standing, pt required cuing for hand placement  Ambulation/Gait Ambulation/Gait assistance: Min guard Gait Distance (Feet): 200 Feet Assistive device: Rolling walker (2 wheeled) Gait Pattern/deviations: Step-through pattern;Trunk flexed;Decreased stride length Gait  velocity: decreased but did improve during last bit of ambulation   General Gait Details: pt ambulates with decreased gait speed, pt reports she normally "whizzes" around, pt required frequent cuing for keeping walker close and maintaining upright posture, pt took very wide turn to turn around in hallway  Stairs            Wheelchair Mobility    Modified Rankin (Stroke Patients Only)       Balance Overall balance assessment: Needs assistance Sitting-balance support: Single extremity supported Sitting balance-Leahy Scale: Poor     Standing balance support: Bilateral upper extremity supported Standing balance-Leahy Scale: Poor Standing balance comment: pt required UE support to maintain balance                             Pertinent Vitals/Pain Pain Assessment: No/denies pain    Home Living Family/patient expects to be discharged to:: Assisted living                      Prior Function           Comments: pt reports no falls in the last year, pt reports ambulating with no AD     Hand Dominance        Extremity/Trunk Assessment   Upper Extremity Assessment Upper Extremity Assessment: Generalized weakness    Lower Extremity Assessment Lower Extremity Assessment: Generalized weakness       Communication   Communication: No difficulties  Cognition Arousal/Alertness: Awake/alert Behavior During Therapy: WFL for tasks assessed/performed Overall Cognitive Status: History of cognitive impairments - at baseline  General Comments      Exercises     Assessment/Plan    PT Assessment Patient needs continued PT services  PT Problem List Decreased strength;Decreased range of motion;Decreased cognition;Decreased activity tolerance;Decreased knowledge of use of DME;Decreased balance;Decreased safety awareness;Decreased mobility       PT Treatment Interventions DME  instruction;Therapeutic exercise;Gait training;Balance training;Stair training;Neuromuscular re-education;Functional mobility training;Cognitive remediation;Therapeutic activities;Patient/family education    PT Goals (Current goals can be found in the Care Plan section)  Acute Rehab PT Goals Patient Stated Goal: eat and walk PT Goal Formulation: With patient Time For Goal Achievement: 12/15/18 Potential to Achieve Goals: Good    Frequency Min 3X/week   Barriers to discharge Decreased caregiver support      Co-evaluation               AM-PAC PT "6 Clicks" Mobility  Outcome Measure Help needed turning from your back to your side while in a flat bed without using bedrails?: A Little Help needed moving from lying on your back to sitting on the side of a flat bed without using bedrails?: A Little Help needed moving to and from a bed to a chair (including a wheelchair)?: A Little Help needed standing up from a chair using your arms (e.g., wheelchair or bedside chair)?: A Little Help needed to walk in hospital room?: A Little Help needed climbing 3-5 steps with a railing? : A Lot 6 Click Score: 17    End of Session Equipment Utilized During Treatment: Gait belt Activity Tolerance: Patient tolerated treatment well Patient left: in chair;with chair alarm set;with call bell/phone within reach Nurse Communication: Mobility status PT Visit Diagnosis: Unsteadiness on feet (R26.81);Muscle weakness (generalized) (M62.81);Other abnormalities of gait and mobility (R26.89)    Time: 4098-1191 PT Time Calculation (min) (ACUTE ONLY): 25 min   Charges:   PT Evaluation $PT Eval Moderate Complexity: 1 Mod PT Treatments $Gait Training: 8-22 mins        Jaime Mooney, [place] [phone]   Jaime Mooney 12/08/2018, 1:52 PM

## 2018-12-09 ENCOUNTER — Encounter (HOSPITAL_COMMUNITY): Payer: Self-pay | Admitting: Internal Medicine

## 2018-12-09 DIAGNOSIS — R627 Adult failure to thrive: Secondary | ICD-10-CM

## 2018-12-09 DIAGNOSIS — G309 Alzheimer's disease, unspecified: Secondary | ICD-10-CM

## 2018-12-09 DIAGNOSIS — I10 Essential (primary) hypertension: Secondary | ICD-10-CM

## 2018-12-09 DIAGNOSIS — I491 Atrial premature depolarization: Secondary | ICD-10-CM

## 2018-12-09 LAB — GLUCOSE, CAPILLARY
GLUCOSE-CAPILLARY: 135 mg/dL — AB (ref 70–99)
Glucose-Capillary: 114 mg/dL — ABNORMAL HIGH (ref 70–99)
Glucose-Capillary: 134 mg/dL — ABNORMAL HIGH (ref 70–99)
Glucose-Capillary: 165 mg/dL — ABNORMAL HIGH (ref 70–99)

## 2018-12-09 MED ORDER — METOPROLOL TARTRATE 12.5 MG HALF TABLET
12.5000 mg | ORAL_TABLET | Freq: Two times a day (BID) | ORAL | Status: DC
  Administered 2018-12-09 – 2018-12-10 (×3): 12.5 mg via ORAL
  Filled 2018-12-09 (×3): qty 1

## 2018-12-09 MED ORDER — POTASSIUM CHLORIDE CRYS ER 20 MEQ PO TBCR
40.0000 meq | EXTENDED_RELEASE_TABLET | Freq: Once | ORAL | Status: AC
  Administered 2018-12-09: 40 meq via ORAL
  Filled 2018-12-09: qty 2

## 2018-12-09 MED ORDER — HYDRALAZINE HCL 20 MG/ML IJ SOLN: 5.0000 mg | Freq: Four times a day (QID) | INTRAMUSCULAR | Status: DC | PRN

## 2018-12-09 MED ORDER — CALCIUM CARBONATE ANTACID 500 MG PO CHEW
400.0000 mg | CHEWABLE_TABLET | Freq: Once | ORAL | Status: AC
  Administered 2018-12-09: 400 mg via ORAL
  Filled 2018-12-09: qty 2

## 2018-12-09 MED ORDER — HYDRALAZINE HCL 20 MG/ML IJ SOLN
5.0000 mg | Freq: Four times a day (QID) | INTRAMUSCULAR | Status: DC | PRN
  Filled 2018-12-09: qty 1

## 2018-12-09 NOTE — Progress Notes (Signed)
I talked to the pt.'s son mr.chip Liggins, he said her mother doesn't have  home medication pills because she doesn't like to take it. She only used a yeast cream for an infection. But I was able to give her P.O. medication pill here as ordered and was able to take and tolerate it. Spoke with MD no further orders.     Britta Mccreedy RN

## 2018-12-09 NOTE — Progress Notes (Addendum)
PROGRESS NOTE  SEVEN MARENGO VVZ:[id number] DOB: 1928-12-19 DOA: 12/06/2018 PCP: Rosina Lowenstein, FNP  Brief Summary:  63 yof with T2DM, CAD, and Alzheimer's , not on any meds at home due to refusing to take meds, coming from [facility] with acute upper abdominal pain and intractable N/V and diarrhea, now resolving, likely gastroenteritis, CT negative, supportive care Awaiting for SNF placement      HPI/Recap of past 24 hours:  Demented elderly  Reports feeling weak, denies pain, no n/v, no fever Very poor Oral intake , remain on gentle hydration  Assessment/Plan: Principal Problem:   Intractable nausea and vomiting Active Problems:   Type 2 diabetes mellitus without complication, with long-term current use of insulin (HCC)   Alzheimer disease ([place])   Gastroenteritis   Constipated   Diverticulosis   Hiatal hernia  Intractable nausea/ vomiting/Diarrhea - With report of nausea vomiting and diarrhea and sick contacts at ALF  -CT of  abdomen pelvis without any acute findings, no obstruction but reveals moderate stool burden. -suspect viral gastroenteritis versus food poisoning.  -symptom resolved,stool sample was not collected, remain to have poor  oral intake, continue hydration   Type 2 diabetes mellitus, noninsulin dependent -Controlled,  -a1c 7.2, am fasting blood glucose  130, does not appear to be on any meds at home -sensitive sliding scale for now  H/o CAD, atypical chest pain S/p cardiac cath in 2018, though chest discomfort was not due to obstructive coronary disease. (detail please see cardiology discharge summary on 11/03/2016) she was discharged on lipitor 40mg  daily and lopressor 12.5 bid at that time, however, I donot see this meds on her home meds list this time, I have talked to pharmacy to verify home meds  Addendum: per family patient has been refusing to take any meds at home.  HTN: Tele with sinus rhythm with frequent pac's Start  on lopressor 12.5mg  bid with holding parameters  Alzheimer disease ([place]) -possible progressive, she lives with her [facility] at [facility], currently , she needs SNF per PT recommendation.   Anxiety -recently started on low-dose Xanax as needed for anxiety -has been calm since admitted to the hospital  Code Status: full  Family Communication: patient   Disposition Plan: SNF when bed is available   Consultants:  none  Procedures:  none  Antibiotics:  none   Objective: BP (!) 156/62 (BP Location: Right Arm)   Pulse 77   Temp 97.9 F (36.6 C) (Oral)   Resp 18   Ht 5' (1.524 m)   Wt 60.8 kg   SpO2 95%   BMI 26.18 kg/m   Intake/Output Summary (Last 24 hours) at 12/09/2018 1317 Last data filed at 12/09/2018 1000 Gross per 24 hour  Intake 1738.07 ml  Output 1451 ml  Net 287.07 ml   Filed Weights   12/07/18 0441 12/07/18 2109 12/08/18 2040  Weight: 60.9 kg 60.8 kg 60.8 kg    Exam: Patient is examined daily including today on 12/09/2018, exams remain the same as of yesterday except that has changed    General:  NAD, demented   Cardiovascular: RRR  Respiratory: CTABL  Abdomen: Soft/ND/NT, positive BS  Musculoskeletal: No Edema  Neuro: alert, oriented to self, know she is in the hospital, but could not remember why or how long she is in the hospital  Data Reviewed: Basic Metabolic Panel: Recent Labs  Lab 12/06/18 2220 12/08/18 0511  NA 140 137  K 4.1 3.6  CL 103 105  CO2 23 22  GLUCOSE 210* 131*  BUN 24* 20  CREATININE 0.65 0.61  CALCIUM 8.9 7.9*   Liver Function Tests: Recent Labs  Lab 12/06/18 2220  AST 22  ALT 20  ALKPHOS 76  BILITOT 0.9  PROT 7.2  ALBUMIN 3.8   Recent Labs  Lab 12/06/18 2220  LIPASE 23   No results for input(s): AMMONIA in the last 168 hours. CBC: Recent Labs  Lab 12/06/18 2220 12/08/18 0511  WBC 13.1* 7.7  HGB 14.0 12.5  HCT 44.0 38.3  MCV 99.3 99.0  PLT 189 167   Cardiac Enzymes:   No results for  input(s): CKTOTAL, CKMB, CKMBINDEX, TROPONINI in the last 168 hours. BNP (last 3 results) No results for input(s): BNP in the last 8760 hours.  ProBNP (last 3 results) No results for input(s): PROBNP in the last 8760 hours.  CBG: Recent Labs  Lab 12/08/18 1103 12/08/18 1607 12/08/18 2040 12/09/18 0650 12/09/18 1115  GLUCAP 112* 101* 112* 135* 114*    Recent Results (from the past 240 hour(s))  MRSA PCR Screening     Status: None   Collection Time: 12/07/18  5:10 AM  Result Value Ref Range Status   MRSA by PCR NEGATIVE NEGATIVE Final    Comment:        The GeneXpert MRSA Assay (FDA approved for NASAL specimens only), is one component of a comprehensive MRSA colonization surveillance program. It is not intended to diagnose MRSA infection nor to guide or monitor treatment for MRSA infections. Performed at [facility], [place] [address], [place], [place] [address]      Studies: No results found.  Scheduled Meds: . docusate sodium  100 mg Oral BID  . enoxaparin (LOVENOX) injection  40 mg Subcutaneous Q24H  . insulin aspart  0-9 Units Subcutaneous TID WC  . promethazine  12.5 mg Intravenous Once    Continuous Infusions: . sodium chloride 75 mL/hr at 12/09/18 1000     Time spent: 82mins I have personally reviewed and interpreted on  12/09/2018 daily labs, tele strips, imagings as discussed above under date review session and assessment and plans.  I reviewed all nursing notes, pharmacy notes, vitals, pertinent old records  I have discussed plan of care as described above with RN , patient  on 12/09/2018   Florencia Reasons MD, PhD  [facility] Pager [phone]. If 7PM-7AM, please contact night-coverage at [email or web address], password [facility] 12/09/2018, 1:17 PM  LOS: 1 day

## 2018-12-09 NOTE — Clinical Social Work Note (Signed)
Clinical Social Work Assessment  Patient Details  Name: Jaime Mooney MRN: [id number] Date of Birth: [birth year]  Date of referral:  12/09/18               Reason for consult:  Discharge Planning                Permission sought to share information with:  Family Supports Permission granted to share information::  Yes, Verbal Permission Granted  Name::     Chip Renato Battles and [place]::     Relationship::  Son and daughter   Sport and exercise psychologist Information:  Chip - [phone] and Belenda Cruise [phone]  Housing/Transportation Living arrangements for the past 2 months:  Apartment([facility]) Source of Information:  Adult Children Patient Interpreter Needed:  None Criminal Activity/Legal Involvement Pertinent to Current Situation/Hospitalization:  No - Comment as needed Significant Relationships:  Adult Children Lives with:  Spouse Do you feel safe going back to the place where you live?  Yes(Daughter expressed awareness that her mother will need services in place to safely return home) Need for family participation in patient care:  Yes (Comment)  Care giving concerns:  Daughter Belenda Cruise expressed awareness that her mother has dementia, and is weak and needs additional assistance at home. She informed CSW that her dad is under a program through [facility] called "[facility]" and she explained the services they provide. Per Belenda Cruise she has applied for her mother to be in this program and will follow-up with [facility] tomorrow (talked with daughter at 4:58 pm) regarding services for her at discharge.  Social Worker assessment / plan: CSW talked with patient at the bedside regarding her current living situation and readiness for discharge. Mrs. Scrivens was sitting in a chair and was very pleasant, talkative and pleasantly confused. When asked a question, patient would talk about things not related to what she was asked. Patient said at one point, "I haven't seen you in such a long  time". Mrs. Swango gave CSW permission to call her son Chip and her daughter Belenda Cruise. Chip was contacted first and advised CSW to call his sister as she is patient's HCPOA. Talked with Belenda Cruise by phone and she expressed awareness that her mom is weak and needs additional services at home. She asked CSW if any medical equipment was recommended and this information was provided. She indicated that her mom will use a walker at times and sometimes fusses at her husband for using his walker. Daughter advised that patient is ready for discharge and she will informed CSW that she will call facility tomorrow to see if the "Living Well" program can start tomorrow for her mom.   Employment status:  Retired Forensic scientist:  Med[employer]are, Engineer, agricultural Supplement) PT Recommendations:  [place], [place] / Referral to community resources:  Other (Comment Required)(None needed or requested by family at this time. Daughter plan to contact facility regarding home care services for patient.)  Patient/Family's Response to care:  No concerns expressed by patient or daughter regarding the care patient has received during hospitalization.  Patient/Family's Understanding of and Emotional Response to Diagnosis, Current Treatment, and Prognosis: Daughter expressed awareness of her mother's medical issues and current weakness and need for additional services at home. Her plan is to call facility tomorrow (3/11) and get back with CSW regarding the plan for her mother, in terms of services she can receive at home.  Emotional Assessment Appearance:  Appears stated age Attitude/Demeanor/Rapport:  Engaged, Other(Pleasantly confused)  Affect (typically observed):  Pleasant Orientation:  Oriented to Situation, Oriented to Place, Oriented to Self(CSW advised that patient oriented to time and situation. However CSW unsure if patient oriented to situation based on conversation with  her.) Alcohol / Substance use:  Tobacco Use, Alcohol Use, Illicit Drugs(Patient reports (per H&P) that she quit smoking, has current alcohol use and does not use illicit drugs) Psych involvement (Current and /or in the community):  No (Comment)  Discharge Needs  Concerns to be addressed:  Discharge Planning Concerns Readmission within the last 30 days:  No Current discharge risk:  None Barriers to Discharge:  Other(Daughter plans to contact [facility] on Wednesday regarding home care services for patient)   Suzette Battiest 12/09/2018, 6:35 PM

## 2018-12-10 LAB — BASIC METABOLIC PANEL
ANION GAP: 7 (ref 5–15)
BUN: 9 mg/dL (ref 8–23)
CO2: 25 mmol/L (ref 22–32)
Calcium: 8.4 mg/dL — ABNORMAL LOW (ref 8.9–10.3)
Chloride: 109 mmol/L (ref 98–111)
Creatinine, Ser: 0.55 mg/dL (ref 0.44–1.00)
GFR calc Af Amer: >60 mL/min (ref >60)
GFR calc non Af Amer: >60 mL/min (ref >60)
Glucose, Bld: 116 mg/dL — ABNORMAL HIGH (ref 70–99)
Potassium: 3.5 mmol/L (ref 3.5–5.1)
Sodium: 141 mmol/L (ref 135–145)

## 2018-12-10 LAB — MAGNESIUM: Magnesium: 1.8 mg/dL (ref 1.7–2.4)

## 2018-12-10 LAB — GLUCOSE, CAPILLARY
Glucose-Capillary: 110 mg/dL — ABNORMAL HIGH (ref 70–99)
Glucose-Capillary: 116 mg/dL — ABNORMAL HIGH (ref 70–99)

## 2018-12-10 LAB — TSH: TSH: 1.378 u[IU]/mL (ref 0.350–4.500)

## 2018-12-10 MED ORDER — METOPROLOL TARTRATE 25 MG PO TABS: 12.5000 mg | ORAL_TABLET | Freq: Two times a day (BID) | ORAL | 0 refills | Status: AC

## 2018-12-10 NOTE — Discharge Summary (Signed)
Physician Discharge Summary  Jaime Mooney VFI:[id number] DOB: 11/20/1928 DOA: 12/06/2018  PCP: Rosina Lowenstein, FNP  Admit date: 12/06/2018 Discharge date: 12/10/2018  Admitted From: ALF Disposition:  ALF  Recommendations for Outpatient Follow-up:  1. Follow up with PCP in 1-2 weeks 2. Please obtain BMP/CBC in one week 3. Please encourage patient to eat and drink fluids.  4. Please start ensure.     Discharge Condition: stable.  CODE STATUS: Full code Diet recommendation: Heart Healthy  Brief/Interim Summary: 66 yof with T2DM, CAD, and Alzheimer's , not on any meds at home due to refusing to take meds, coming from [facility] with acute upper abdominal pain and intractable N/V and diarrhea, now resolving, likely gastroenteritis, CT negative, supportive care Awaiting for SNF placement .  Intractable nausea/ vomiting/Diarrhea: Gastroenteritis. Viral.  - With report of nausea vomiting and diarrhea and sick contacts at ALF  -CT ofabdomen pelvis without any acute findings, no obstruction but reveals moderate stool burden. -suspect viral gastroenteritis versus food poisoning.  -symptom resolved,stool sample was not collected, remain to have poor  oral intake, continue hydration. Symptoms resolved.  Encourage patient to eat.    Type 2 diabetes mellitus, noninsulin dependent -Controlled,  -a1c 7.2, am fasting blood glucose  130, does not appear to be on any meds at home   H/o CAD, atypical chest pain S/p cardiac cath in 2018, though chest discomfort was not due to obstructive coronary disease. (detail please see cardiology discharge summary on 11/03/2016) -resume lipitor.  Addendum: per family patient has been refusing to take any meds at home.  HTN: Tele with sinus rhythm with frequent pac's Started  on lopressor 12.5mg  bid with holding parameters  Alzheimer disease ([place]) -possible progressive, she [facility] her [facility], currently , she  needs SNF per PT recommendation. more confuse than baseline per family. Hopefully will get better over time.   Anxiety -recently started on low-dose Xanax as needed for anxiety -has been calm since admitted to the hospital  Discharge Diagnoses:  Principal Problem:   Intractable nausea and vomiting Active Problems:   Diabetes mellitus type 2, noninsulin dependent ([place])   Alzheimer disease ([place])   Gastroenteritis   Constipated   Diverticulosis   Hiatal hernia   PAC (premature atrial contraction)   Essential hypertension   FTT (failure to thrive) in adult    Discharge Instructions  Discharge Instructions    Diet - low sodium heart healthy   Complete by:  As directed    Increase activity slowly   Complete by:  As directed      Allergies as of 12/10/2018      Reactions   Acetaminophen Swelling   Patient doesn't recall site of swelling (??)   Lisinopril Swelling, Other (See Comments)   Facial swelling and tongue swelling   Norvasc [amlodipine Besylate] Swelling   Patient doesn't recall site of swelling (??)   Prednisone Swelling, Other (See Comments)   Facial swelling, can take in small doses   Epinephrine Palpitations      Medication List    TAKE these medications   ALPRAZolam 0.25 MG tablet Commonly known as:  Xanax Take 1 tablet (0.25 mg total) by mouth 3 (three) times daily as needed for anxiety.   metoprolol tartrate 25 MG tablet Commonly known as:  LOPRESSOR Take 0.5 tablets (12.5 mg total) by mouth 2 (two) times daily.   nystatin cream Commonly known as:  MYCOSTATIN Apply 1 application topically as directed.   Tums Ultra  1000 400 MG chewable tablet Generic drug:  calcium elemental as carbonate Chew 1,000 mg by mouth at bedtime.   TURMERIC PO Take 1 capsule by mouth daily.            Durable Medical Equipment  (From admission, onward)         Start     Ordered   12/10/18 1030  For home use only DME Walker  Once    Question:  Patient  needs a walker to treat with the following condition  Answer:  Balance disorder   12/10/18 1029          Allergies  Allergen Reactions  . Acetaminophen Swelling    Patient doesn't recall site of swelling (??)  . Lisinopril Swelling and Other (See Comments)    Facial swelling and tongue swelling  . Norvasc [Amlodipine Besylate] Swelling    Patient doesn't recall site of swelling (??)  . Prednisone Swelling and Other (See Comments)    Facial swelling, can take in small doses  . Epinephrine Palpitations    Consultations:  none   Procedures/Studies: Ct Abdomen Pelvis W Contrast  Result Date: 12/07/2018 CLINICAL DATA:  Acute abdominal pain. Nausea. EXAM: CT ABDOMEN AND PELVIS WITH CONTRAST TECHNIQUE: Multidetector CT imaging of the abdomen and pelvis was performed using the standard protocol following bolus administration of intravenous contrast. CONTRAST:  158mL OMNIPAQUE IOHEXOL 300 MG/ML  SOLN COMPARISON:  Radiograph earlier this day. CT 06/19/2016 FINDINGS: Lower chest: Scoliotic curvature distorts normal anatomy. Bilateral lower lobe atelectasis. No pleural fluid. Hepatobiliary: Calcified granuloma without focal lesion. Clips in the gallbladder fossa postcholecystectomy. No biliary dilatation. Pancreas: Parenchymal atrophy. No ductal dilatation or inflammation. Spleen: Calcified granuloma. Adrenals/Urinary Tract: Normal adrenal glands. No hydronephrosis or perinephric edema. Homogeneous renal enhancement with symmetric excretion on delayed phase imaging. Simple cysts in the upper and mid left kidney. Urinary bladder is physiologically distended without wall thickening. Stomach/Bowel: Small hiatal hernia. Stomach is partially distended. Motion artifact limits detailed assessment. No bowel wall thickening or inflammatory change. No evidence of obstruction. Normal appendix. Moderate stool burden in the colon. Mild diverticulosis of the distal colon without diverticulitis. Vascular/Lymphatic:  Prominent left adnexal vascularity. Dilatation of the left ovarian vein at 8 mm. Aortic atherosclerosis. Portal vein and mesenteric vessels are patent. No adenopathy. Reproductive: Prominent left adnexal vascularity and dilatation of the ovarian pain. Coarse calcification in the right adnexa, unchanged. Uterus is unremarkable. Other: Laxity of the anterior abdominal wall musculature. No free air, free fluid, or intra-abdominal fluid collection. Musculoskeletal: Scoliosis and degenerative change throughout the spine. There are no acute or suspicious osseous abnormalities. IMPRESSION: 1. No acute findings. 2. Prominent left adnexal vascularity and dilatation of the ovarian vein, can be seen with pelvic congestion syndrome. 3. Mild distal colonic diverticulosis without diverticulitis. 4.  Aortic Atherosclerosis (ICD10-I70.0). Electronically Signed   By: Keith Rake M.D.   On: 12/07/2018 00:39   Dg Abd Acute 2+v W 1v Chest  Result Date: 12/06/2018 CLINICAL DATA:  Abdominal pain. EXAM: DG ABDOMEN ACUTE W/ 1V CHEST COMPARISON:  October 31, 2016 FINDINGS: Scoliotic curvature of the thoracic spine. No pneumothorax. The cardiomediastinal silhouette is stable. Scar/atelectasis in the left base, unchanged. The lungs are otherwise clear. No free air, portal venous gas, or pneumatosis. No bowel obstruction. IMPRESSION: Negative abdominal radiographs.  No acute cardiopulmonary disease. Electronically Signed   By: Dorise Bullion III M.D   On: 12/06/2018 23:00    Subjective: Alert, denies pain   Discharge Exam: Vitals:  12/10/18 0223 12/10/18 0951  BP: (!) 142/45 (!) 152/54  Pulse: 60 61  Resp:  18  Temp:  98 F (36.7 C)  SpO2:  96%     General: Pt is alert, awake, not in acute distress Cardiovascular: RRR, S1/S2 +, no rubs, no gallops Respiratory: CTA bilaterally, no wheezing, no rhonchi Abdominal: Soft, NT, ND, bowel sounds + Extremities: no edema, no cyanosis    The results of significant  diagnostics from this hospitalization (including imaging, microbiology, ancillary and laboratory) are listed below for reference.     Microbiology: Recent Results (from the past 240 hour(s))  MRSA PCR Screening     Status: None   Collection Time: 12/07/18  5:10 AM  Result Value Ref Range Status   MRSA by PCR NEGATIVE NEGATIVE Final    Comment:        The GeneXpert MRSA Assay (FDA approved for NASAL specimens only), is one component of a comprehensive MRSA colonization surveillance program. It is not intended to diagnose MRSA infection nor to guide or monitor treatment for MRSA infections. Performed at [facility], [place] [address], [place], [place] [address]      Labs: BNP (last 3 results) No results for input(s): BNP in the last 8760 hours. Basic Metabolic Panel: Recent Labs  Lab 12/06/18 2220 12/08/18 0511 12/10/18 0324  NA 140 137 141  K 4.1 3.6 3.5  CL 103 105 109  CO2 23 22 25   GLUCOSE 210* 131* 116*  BUN 24* 20 9  CREATININE 0.65 0.61 0.55  CALCIUM 8.9 7.9* 8.4*  MG  --   --  1.8   Liver Function Tests: Recent Labs  Lab 12/06/18 2220  AST 22  ALT 20  ALKPHOS 76  BILITOT 0.9  PROT 7.2  ALBUMIN 3.8   Recent Labs  Lab 12/06/18 2220  LIPASE 23   No results for input(s): AMMONIA in the last 168 hours. CBC: Recent Labs  Lab 12/06/18 2220 12/08/18 0511  WBC 13.1* 7.7  HGB 14.0 12.5  HCT 44.0 38.3  MCV 99.3 99.0  PLT 189 167   Cardiac Enzymes: No results for input(s): CKTOTAL, CKMB, CKMBINDEX, TROPONINI in the last 168 hours. BNP: Invalid input(s): POCBNP CBG: Recent Labs  Lab 12/09/18 0650 12/09/18 1115 12/09/18 1557 12/09/18 2139 12/10/18 0707  GLUCAP 135* 114* 134* 165* 110*   D-Dimer No results for input(s): DDIMER in the last 72 hours. Hgb A1c Recent Labs    12/08/18 0511  HGBA1C 7.2*   Lipid Profile No results for input(s): CHOL, HDL, LDLCALC, TRIG, CHOLHDL, LDLDIRECT in the last 72 hours. Thyroid function  studies Recent Labs    12/10/18 0324  TSH 1.378   Anemia work up No results for input(s): VITAMINB12, FOLATE, FERRITIN, TIBC, IRON, RETICCTPCT in the last 72 hours. Urinalysis    Component Value Date/Time   COLORURINE STRAW (A) 12/06/2018 2326   APPEARANCEUR CLEAR 12/06/2018 2326   LABSPEC 1.013 12/06/2018 2326   PHURINE 7.0 12/06/2018 2326   GLUCOSEU 150 (A) 12/06/2018 2326   HGBUR SMALL (A) 12/06/2018 2326   BILIRUBINUR NEGATIVE 12/06/2018 2326   BILIRUBINUR n 05/05/2018 1019   KETONESUR 20 (A) 12/06/2018 2326   PROTEINUR NEGATIVE 12/06/2018 2326   UROBILINOGEN negative (A) 05/05/2018 1019   NITRITE NEGATIVE 12/06/2018 2326   LEUKOCYTESUR SMALL (A) 12/06/2018 2326   Sepsis Labs Invalid input(s): PROCALCITONIN,  WBC,  LACTICIDVEN Microbiology Recent Results (from the past 240 hour(s))  MRSA PCR Screening     Status:  None   Collection Time: 12/07/18  5:10 AM  Result Value Ref Range Status   MRSA by PCR NEGATIVE NEGATIVE Final    Comment:        The GeneXpert MRSA Assay (FDA approved for NASAL specimens only), is one component of a comprehensive MRSA colonization surveillance program. It is not intended to diagnose MRSA infection nor to guide or monitor treatment for MRSA infections. Performed at [facility], [place] [address] [place], [place] [address]      Time coordinating discharge: 40 minutes  SIGNED:   Elmarie Shiley, MD  [facility]

## 2018-12-10 NOTE — Progress Notes (Signed)
Jaime Mooney to be D/C'd Home per MD order.  Discussed prescriptions and follow up appointments with the patient. Prescriptions given to patient, medication list explained in detail. Pt verbalized understanding.  Allergies as of 12/10/2018      Reactions   Acetaminophen Swelling   Patient doesn't recall site of swelling (??)   Lisinopril Swelling, Other (See Comments)   Facial swelling and tongue swelling   Norvasc [amlodipine Besylate] Swelling   Patient doesn't recall site of swelling (??)   Prednisone Swelling, Other (See Comments)   Facial swelling, can take in small doses   Epinephrine Palpitations      Medication List    TAKE these medications   ALPRAZolam 0.25 MG tablet Commonly known as:  Xanax Take 1 tablet (0.25 mg total) by mouth 3 (three) times daily as needed for anxiety.   metoprolol tartrate 25 MG tablet Commonly known as:  LOPRESSOR Take 0.5 tablets (12.5 mg total) by mouth 2 (two) times daily.   nystatin cream Commonly known as:  MYCOSTATIN Apply 1 application topically as directed.   Tums Ultra 1000 400 MG chewable tablet Generic drug:  calcium elemental as carbonate Chew 1,000 mg by mouth at bedtime.   TURMERIC PO Take 1 capsule by mouth daily.            Durable Medical Equipment  (From admission, onward)         Start     Ordered   12/10/18 1030  For home use only DME Walker  Once    Question:  Patient needs a walker to treat with the following condition  Answer:  Balance disorder   12/10/18 1029          Vitals:   12/10/18 0223 12/10/18 0951  BP: (!) 142/45 (!) 152/54  Pulse: 60 61  Resp:  18  Temp:  98 F (36.7 C)  SpO2:  96%    Skin clean, dry and intact without evidence of skin break down, no evidence of skin tears noted. IV catheter discontinued intact. Site without signs and symptoms of complications. Dressing and pressure applied. Pt denies pain at this time. No complaints noted.  An After Visit Summary was printed and  given to the patient. Patient escorted via stretcher, and D/C home via [facility].  Aneta Mins BSN, RN

## 2018-12-10 NOTE — Progress Notes (Signed)
Physical Therapy Treatment Patient Details Name: Jaime Mooney MRN: [id number] DOB: [birth year] Today's Date: 12/10/2018    History of Present Illness Pt is an 85 y/o female admitted for intractable N/V with gastroenteritis from [facility]. PMH significant for DM, Alzheimer's, anxiety, HTN, HLD    PT Comments    Pt progressing towards physical therapy goals. Was able to perform transfers and ambulation with gross min assist for balance and safety, and RW for support. Pt with urinary incontinence in the bed, and focus of session was transfer to chair, for gown change and peri-care/washing legs off. Pt was able to attempt washing herself, however required VC's for attention to task, and mod assist for reaching back side. Will continue to follow and progress as able per POC.    Follow Up Recommendations  SNF;Supervision/Assistance - 24 hour     Equipment Recommendations  Rolling walker with 5" wheels    Recommendations for Other Services       Precautions / Restrictions Precautions Precautions: Fall Restrictions Weight Bearing Restrictions: No    Mobility  Bed Mobility Overal bed mobility: Needs Assistance Bed Mobility: Rolling;Sidelying to Sit Rolling: Min guard Sidelying to sit: Min assist       General bed mobility comments: VC's for log roll technique. Pt required assist to initiate and to bring LE's off EOB, and pt was able to transition up to full sitting position without assistance.  Transfers Overall transfer level: Needs assistance Equipment used: Rolling walker (2 wheeled) Transfers: Sit to/from [facility] Sit to Stand: Min assist Stand pivot transfers: Min assist       General transfer comment: VC's for hand placement on seated surface for safety. Min assist to power-up to full standing position. She was able to take a few pivotal steps around to the recliner with assistance for walker management and balance support.   Ambulation/Gait              General Gait Details: Not tested   Stairs             Wheelchair Mobility    Modified Rankin (Stroke Patients Only)       Balance Overall balance assessment: Needs assistance Sitting-balance support: Single extremity supported Sitting balance-Leahy Scale: Poor     Standing balance support: Bilateral upper extremity supported Standing balance-Leahy Scale: Poor Standing balance comment: pt required UE support to maintain balance                            Cognition Arousal/Alertness: Awake/alert Behavior During Therapy: WFL for tasks assessed/performed Overall Cognitive Status: History of cognitive impairments - at baseline                                        Exercises      General Comments        Pertinent Vitals/Pain Pain Assessment: No/denies pain    Home Living                      Prior Function            PT Goals (current goals can now be found in the care plan section) Acute Rehab PT Goals Patient Stated Goal: Go "home" today PT Goal Formulation: With patient Time For Goal Achievement: 12/15/18 Potential to Achieve Goals: Good Progress towards PT goals: Progressing  toward goals    Frequency    Min 2X/week      PT Plan Frequency needs to be updated    Co-evaluation              AM-PAC PT "6 Clicks" Mobility   Outcome Measure  Help needed turning from your back to your side while in a flat bed without using bedrails?: A Little Help needed moving from lying on your back to sitting on the side of a flat bed without using bedrails?: A Little Help needed moving to and from a bed to a chair (including a wheelchair)?: A Little Help needed standing up from a chair using your arms (e.g., wheelchair or bedside chair)?: A Little Help needed to walk in hospital room?: A Little Help needed climbing 3-5 steps with a railing? : A Lot 6 Click Score: 17    End of Session Equipment  Utilized During Treatment: Gait belt Activity Tolerance: Patient tolerated treatment well Patient left: in chair;with call bell/phone within reach;with chair alarm set Nurse Communication: Mobility status PT Visit Diagnosis: Unsteadiness on feet (R26.81);Muscle weakness (generalized) (M62.81);Other abnormalities of gait and mobility (R26.89)     Time: 5003-7048 PT Time Calculation (min) (ACUTE ONLY): 28 min  Charges:  $Gait Training: 23-37 mins                     Rolinda Roan, PT, DPT Acute Rehabilitation Services Pager: [phone] Office: [phone]    Thelma Comp 12/10/2018, 1:49 PM

## 2018-12-11 DIAGNOSIS — R41841 Cognitive communication deficit: Secondary | ICD-10-CM | POA: Diagnosis not present

## 2018-12-11 DIAGNOSIS — R498 Other voice and resonance disorders: Secondary | ICD-10-CM | POA: Diagnosis not present

## 2018-12-11 DIAGNOSIS — M6281 Muscle weakness (generalized): Secondary | ICD-10-CM | POA: Diagnosis not present

## 2018-12-11 DIAGNOSIS — R262 Difficulty in walking, not elsewhere classified: Secondary | ICD-10-CM | POA: Diagnosis not present

## 2018-12-11 DIAGNOSIS — R293 Abnormal posture: Secondary | ICD-10-CM | POA: Diagnosis not present

## 2018-12-12 DIAGNOSIS — R41841 Cognitive communication deficit: Secondary | ICD-10-CM | POA: Diagnosis not present

## 2018-12-12 DIAGNOSIS — R262 Difficulty in walking, not elsewhere classified: Secondary | ICD-10-CM | POA: Diagnosis not present

## 2018-12-12 DIAGNOSIS — R498 Other voice and resonance disorders: Secondary | ICD-10-CM | POA: Diagnosis not present

## 2018-12-12 DIAGNOSIS — R293 Abnormal posture: Secondary | ICD-10-CM | POA: Diagnosis not present

## 2018-12-12 DIAGNOSIS — M6281 Muscle weakness (generalized): Secondary | ICD-10-CM | POA: Diagnosis not present

## 2018-12-15 DIAGNOSIS — R498 Other voice and resonance disorders: Secondary | ICD-10-CM | POA: Diagnosis not present

## 2018-12-15 DIAGNOSIS — R262 Difficulty in walking, not elsewhere classified: Secondary | ICD-10-CM | POA: Diagnosis not present

## 2018-12-15 DIAGNOSIS — M6281 Muscle weakness (generalized): Secondary | ICD-10-CM | POA: Diagnosis not present

## 2018-12-15 DIAGNOSIS — R41841 Cognitive communication deficit: Secondary | ICD-10-CM | POA: Diagnosis not present

## 2018-12-15 DIAGNOSIS — R293 Abnormal posture: Secondary | ICD-10-CM | POA: Diagnosis not present

## 2018-12-16 DIAGNOSIS — R293 Abnormal posture: Secondary | ICD-10-CM | POA: Diagnosis not present

## 2018-12-16 DIAGNOSIS — R498 Other voice and resonance disorders: Secondary | ICD-10-CM | POA: Diagnosis not present

## 2018-12-16 DIAGNOSIS — R262 Difficulty in walking, not elsewhere classified: Secondary | ICD-10-CM | POA: Diagnosis not present

## 2018-12-16 DIAGNOSIS — M6281 Muscle weakness (generalized): Secondary | ICD-10-CM | POA: Diagnosis not present

## 2018-12-16 DIAGNOSIS — R41841 Cognitive communication deficit: Secondary | ICD-10-CM | POA: Diagnosis not present

## 2018-12-17 DIAGNOSIS — R498 Other voice and resonance disorders: Secondary | ICD-10-CM | POA: Diagnosis not present

## 2018-12-17 DIAGNOSIS — R293 Abnormal posture: Secondary | ICD-10-CM | POA: Diagnosis not present

## 2018-12-17 DIAGNOSIS — R41841 Cognitive communication deficit: Secondary | ICD-10-CM | POA: Diagnosis not present

## 2018-12-17 DIAGNOSIS — M6281 Muscle weakness (generalized): Secondary | ICD-10-CM | POA: Diagnosis not present

## 2018-12-17 DIAGNOSIS — R262 Difficulty in walking, not elsewhere classified: Secondary | ICD-10-CM | POA: Diagnosis not present

## 2018-12-18 DIAGNOSIS — R293 Abnormal posture: Secondary | ICD-10-CM | POA: Diagnosis not present

## 2018-12-18 DIAGNOSIS — R262 Difficulty in walking, not elsewhere classified: Secondary | ICD-10-CM | POA: Diagnosis not present

## 2018-12-18 DIAGNOSIS — M6281 Muscle weakness (generalized): Secondary | ICD-10-CM | POA: Diagnosis not present

## 2018-12-18 DIAGNOSIS — R498 Other voice and resonance disorders: Secondary | ICD-10-CM | POA: Diagnosis not present

## 2018-12-18 DIAGNOSIS — R41841 Cognitive communication deficit: Secondary | ICD-10-CM | POA: Diagnosis not present

## 2018-12-19 DIAGNOSIS — R498 Other voice and resonance disorders: Secondary | ICD-10-CM | POA: Diagnosis not present

## 2018-12-19 DIAGNOSIS — R293 Abnormal posture: Secondary | ICD-10-CM | POA: Diagnosis not present

## 2018-12-19 DIAGNOSIS — R41841 Cognitive communication deficit: Secondary | ICD-10-CM | POA: Diagnosis not present

## 2018-12-19 DIAGNOSIS — R262 Difficulty in walking, not elsewhere classified: Secondary | ICD-10-CM | POA: Diagnosis not present

## 2018-12-19 DIAGNOSIS — M6281 Muscle weakness (generalized): Secondary | ICD-10-CM | POA: Diagnosis not present

## 2018-12-22 DIAGNOSIS — R7309 Other abnormal glucose: Secondary | ICD-10-CM | POA: Diagnosis not present

## 2018-12-22 DIAGNOSIS — F0390 Unspecified dementia without behavioral disturbance: Secondary | ICD-10-CM | POA: Diagnosis not present

## 2018-12-22 DIAGNOSIS — I10 Essential (primary) hypertension: Secondary | ICD-10-CM | POA: Diagnosis not present

## 2018-12-22 DIAGNOSIS — R498 Other voice and resonance disorders: Secondary | ICD-10-CM | POA: Diagnosis not present

## 2018-12-22 DIAGNOSIS — R293 Abnormal posture: Secondary | ICD-10-CM | POA: Diagnosis not present

## 2018-12-22 DIAGNOSIS — M6281 Muscle weakness (generalized): Secondary | ICD-10-CM | POA: Diagnosis not present

## 2018-12-22 DIAGNOSIS — E782 Mixed hyperlipidemia: Secondary | ICD-10-CM | POA: Diagnosis not present

## 2018-12-22 DIAGNOSIS — R262 Difficulty in walking, not elsewhere classified: Secondary | ICD-10-CM | POA: Diagnosis not present

## 2018-12-22 DIAGNOSIS — Z79899 Other long term (current) drug therapy: Secondary | ICD-10-CM | POA: Diagnosis not present

## 2018-12-22 DIAGNOSIS — R41841 Cognitive communication deficit: Secondary | ICD-10-CM | POA: Diagnosis not present

## 2018-12-23 DIAGNOSIS — M6281 Muscle weakness (generalized): Secondary | ICD-10-CM | POA: Diagnosis not present

## 2018-12-23 DIAGNOSIS — R262 Difficulty in walking, not elsewhere classified: Secondary | ICD-10-CM | POA: Diagnosis not present

## 2018-12-23 DIAGNOSIS — R498 Other voice and resonance disorders: Secondary | ICD-10-CM | POA: Diagnosis not present

## 2018-12-23 DIAGNOSIS — R293 Abnormal posture: Secondary | ICD-10-CM | POA: Diagnosis not present

## 2018-12-23 DIAGNOSIS — R41841 Cognitive communication deficit: Secondary | ICD-10-CM | POA: Diagnosis not present

## 2018-12-24 DIAGNOSIS — R262 Difficulty in walking, not elsewhere classified: Secondary | ICD-10-CM | POA: Diagnosis not present

## 2018-12-24 DIAGNOSIS — R41841 Cognitive communication deficit: Secondary | ICD-10-CM | POA: Diagnosis not present

## 2018-12-24 DIAGNOSIS — M6281 Muscle weakness (generalized): Secondary | ICD-10-CM | POA: Diagnosis not present

## 2018-12-24 DIAGNOSIS — R498 Other voice and resonance disorders: Secondary | ICD-10-CM | POA: Diagnosis not present

## 2018-12-24 DIAGNOSIS — R293 Abnormal posture: Secondary | ICD-10-CM | POA: Diagnosis not present

## 2018-12-25 DIAGNOSIS — R293 Abnormal posture: Secondary | ICD-10-CM | POA: Diagnosis not present

## 2018-12-25 DIAGNOSIS — R498 Other voice and resonance disorders: Secondary | ICD-10-CM | POA: Diagnosis not present

## 2018-12-25 DIAGNOSIS — R41841 Cognitive communication deficit: Secondary | ICD-10-CM | POA: Diagnosis not present

## 2018-12-25 DIAGNOSIS — M6281 Muscle weakness (generalized): Secondary | ICD-10-CM | POA: Diagnosis not present

## 2018-12-25 DIAGNOSIS — R262 Difficulty in walking, not elsewhere classified: Secondary | ICD-10-CM | POA: Diagnosis not present

## 2018-12-26 DIAGNOSIS — R41841 Cognitive communication deficit: Secondary | ICD-10-CM | POA: Diagnosis not present

## 2018-12-26 DIAGNOSIS — R262 Difficulty in walking, not elsewhere classified: Secondary | ICD-10-CM | POA: Diagnosis not present

## 2018-12-26 DIAGNOSIS — R498 Other voice and resonance disorders: Secondary | ICD-10-CM | POA: Diagnosis not present

## 2018-12-26 DIAGNOSIS — M6281 Muscle weakness (generalized): Secondary | ICD-10-CM | POA: Diagnosis not present

## 2018-12-26 DIAGNOSIS — R293 Abnormal posture: Secondary | ICD-10-CM | POA: Diagnosis not present

## 2018-12-29 DIAGNOSIS — R262 Difficulty in walking, not elsewhere classified: Secondary | ICD-10-CM | POA: Diagnosis not present

## 2018-12-29 DIAGNOSIS — R41841 Cognitive communication deficit: Secondary | ICD-10-CM | POA: Diagnosis not present

## 2018-12-29 DIAGNOSIS — R293 Abnormal posture: Secondary | ICD-10-CM | POA: Diagnosis not present

## 2018-12-29 DIAGNOSIS — R498 Other voice and resonance disorders: Secondary | ICD-10-CM | POA: Diagnosis not present

## 2018-12-29 DIAGNOSIS — M6281 Muscle weakness (generalized): Secondary | ICD-10-CM | POA: Diagnosis not present

## 2018-12-30 DIAGNOSIS — M6281 Muscle weakness (generalized): Secondary | ICD-10-CM | POA: Diagnosis not present

## 2018-12-30 DIAGNOSIS — R498 Other voice and resonance disorders: Secondary | ICD-10-CM | POA: Diagnosis not present

## 2018-12-30 DIAGNOSIS — R41841 Cognitive communication deficit: Secondary | ICD-10-CM | POA: Diagnosis not present

## 2018-12-30 DIAGNOSIS — R262 Difficulty in walking, not elsewhere classified: Secondary | ICD-10-CM | POA: Diagnosis not present

## 2018-12-30 DIAGNOSIS — R293 Abnormal posture: Secondary | ICD-10-CM | POA: Diagnosis not present

## 2018-12-31 DIAGNOSIS — R293 Abnormal posture: Secondary | ICD-10-CM | POA: Diagnosis not present

## 2018-12-31 DIAGNOSIS — R262 Difficulty in walking, not elsewhere classified: Secondary | ICD-10-CM | POA: Diagnosis not present

## 2018-12-31 DIAGNOSIS — R41841 Cognitive communication deficit: Secondary | ICD-10-CM | POA: Diagnosis not present

## 2018-12-31 DIAGNOSIS — M6281 Muscle weakness (generalized): Secondary | ICD-10-CM | POA: Diagnosis not present

## 2019-01-01 DIAGNOSIS — R293 Abnormal posture: Secondary | ICD-10-CM | POA: Diagnosis not present

## 2019-01-01 DIAGNOSIS — M6281 Muscle weakness (generalized): Secondary | ICD-10-CM | POA: Diagnosis not present

## 2019-01-01 DIAGNOSIS — R41841 Cognitive communication deficit: Secondary | ICD-10-CM | POA: Diagnosis not present

## 2019-01-01 DIAGNOSIS — R262 Difficulty in walking, not elsewhere classified: Secondary | ICD-10-CM | POA: Diagnosis not present

## 2019-01-05 DIAGNOSIS — M6281 Muscle weakness (generalized): Secondary | ICD-10-CM | POA: Diagnosis not present

## 2019-01-05 DIAGNOSIS — R41841 Cognitive communication deficit: Secondary | ICD-10-CM | POA: Diagnosis not present

## 2019-01-05 DIAGNOSIS — R262 Difficulty in walking, not elsewhere classified: Secondary | ICD-10-CM | POA: Diagnosis not present

## 2019-01-05 DIAGNOSIS — R293 Abnormal posture: Secondary | ICD-10-CM | POA: Diagnosis not present

## 2019-01-06 DIAGNOSIS — R293 Abnormal posture: Secondary | ICD-10-CM | POA: Diagnosis not present

## 2019-01-06 DIAGNOSIS — R41841 Cognitive communication deficit: Secondary | ICD-10-CM | POA: Diagnosis not present

## 2019-01-06 DIAGNOSIS — M6281 Muscle weakness (generalized): Secondary | ICD-10-CM | POA: Diagnosis not present

## 2019-01-06 DIAGNOSIS — R262 Difficulty in walking, not elsewhere classified: Secondary | ICD-10-CM | POA: Diagnosis not present

## 2019-01-07 DIAGNOSIS — R41841 Cognitive communication deficit: Secondary | ICD-10-CM | POA: Diagnosis not present

## 2019-01-07 DIAGNOSIS — R262 Difficulty in walking, not elsewhere classified: Secondary | ICD-10-CM | POA: Diagnosis not present

## 2019-01-07 DIAGNOSIS — M6281 Muscle weakness (generalized): Secondary | ICD-10-CM | POA: Diagnosis not present

## 2019-01-07 DIAGNOSIS — R293 Abnormal posture: Secondary | ICD-10-CM | POA: Diagnosis not present

## 2019-01-08 DIAGNOSIS — R41841 Cognitive communication deficit: Secondary | ICD-10-CM | POA: Diagnosis not present

## 2019-01-08 DIAGNOSIS — M6281 Muscle weakness (generalized): Secondary | ICD-10-CM | POA: Diagnosis not present

## 2019-01-08 DIAGNOSIS — R262 Difficulty in walking, not elsewhere classified: Secondary | ICD-10-CM | POA: Diagnosis not present

## 2019-01-08 DIAGNOSIS — R293 Abnormal posture: Secondary | ICD-10-CM | POA: Diagnosis not present

## 2019-01-12 DIAGNOSIS — R41841 Cognitive communication deficit: Secondary | ICD-10-CM | POA: Diagnosis not present

## 2019-01-12 DIAGNOSIS — R262 Difficulty in walking, not elsewhere classified: Secondary | ICD-10-CM | POA: Diagnosis not present

## 2019-01-12 DIAGNOSIS — R293 Abnormal posture: Secondary | ICD-10-CM | POA: Diagnosis not present

## 2019-01-12 DIAGNOSIS — M6281 Muscle weakness (generalized): Secondary | ICD-10-CM | POA: Diagnosis not present

## 2019-01-13 DIAGNOSIS — M6281 Muscle weakness (generalized): Secondary | ICD-10-CM | POA: Diagnosis not present

## 2019-01-13 DIAGNOSIS — R293 Abnormal posture: Secondary | ICD-10-CM | POA: Diagnosis not present

## 2019-01-13 DIAGNOSIS — R262 Difficulty in walking, not elsewhere classified: Secondary | ICD-10-CM | POA: Diagnosis not present

## 2019-01-13 DIAGNOSIS — R41841 Cognitive communication deficit: Secondary | ICD-10-CM | POA: Diagnosis not present

## 2019-01-14 DIAGNOSIS — R293 Abnormal posture: Secondary | ICD-10-CM | POA: Diagnosis not present

## 2019-01-14 DIAGNOSIS — R41841 Cognitive communication deficit: Secondary | ICD-10-CM | POA: Diagnosis not present

## 2019-01-14 DIAGNOSIS — M6281 Muscle weakness (generalized): Secondary | ICD-10-CM | POA: Diagnosis not present

## 2019-01-14 DIAGNOSIS — R262 Difficulty in walking, not elsewhere classified: Secondary | ICD-10-CM | POA: Diagnosis not present

## 2019-01-15 DIAGNOSIS — R41841 Cognitive communication deficit: Secondary | ICD-10-CM | POA: Diagnosis not present

## 2019-01-15 DIAGNOSIS — R293 Abnormal posture: Secondary | ICD-10-CM | POA: Diagnosis not present

## 2019-01-15 DIAGNOSIS — R262 Difficulty in walking, not elsewhere classified: Secondary | ICD-10-CM | POA: Diagnosis not present

## 2019-01-15 DIAGNOSIS — M6281 Muscle weakness (generalized): Secondary | ICD-10-CM | POA: Diagnosis not present

## 2019-01-16 DIAGNOSIS — R262 Difficulty in walking, not elsewhere classified: Secondary | ICD-10-CM | POA: Diagnosis not present

## 2019-01-16 DIAGNOSIS — R293 Abnormal posture: Secondary | ICD-10-CM | POA: Diagnosis not present

## 2019-01-16 DIAGNOSIS — M6281 Muscle weakness (generalized): Secondary | ICD-10-CM | POA: Diagnosis not present

## 2019-01-16 DIAGNOSIS — R41841 Cognitive communication deficit: Secondary | ICD-10-CM | POA: Diagnosis not present

## 2019-01-20 DIAGNOSIS — R293 Abnormal posture: Secondary | ICD-10-CM | POA: Diagnosis not present

## 2019-01-20 DIAGNOSIS — R262 Difficulty in walking, not elsewhere classified: Secondary | ICD-10-CM | POA: Diagnosis not present

## 2019-01-20 DIAGNOSIS — R41841 Cognitive communication deficit: Secondary | ICD-10-CM | POA: Diagnosis not present

## 2019-01-20 DIAGNOSIS — M6281 Muscle weakness (generalized): Secondary | ICD-10-CM | POA: Diagnosis not present

## 2019-01-21 DIAGNOSIS — R293 Abnormal posture: Secondary | ICD-10-CM | POA: Diagnosis not present

## 2019-01-21 DIAGNOSIS — R41841 Cognitive communication deficit: Secondary | ICD-10-CM | POA: Diagnosis not present

## 2019-01-21 DIAGNOSIS — R262 Difficulty in walking, not elsewhere classified: Secondary | ICD-10-CM | POA: Diagnosis not present

## 2019-01-21 DIAGNOSIS — M6281 Muscle weakness (generalized): Secondary | ICD-10-CM | POA: Diagnosis not present

## 2019-01-22 DIAGNOSIS — R41841 Cognitive communication deficit: Secondary | ICD-10-CM | POA: Diagnosis not present

## 2019-01-22 DIAGNOSIS — R293 Abnormal posture: Secondary | ICD-10-CM | POA: Diagnosis not present

## 2019-01-22 DIAGNOSIS — R262 Difficulty in walking, not elsewhere classified: Secondary | ICD-10-CM | POA: Diagnosis not present

## 2019-01-22 DIAGNOSIS — M6281 Muscle weakness (generalized): Secondary | ICD-10-CM | POA: Diagnosis not present

## 2019-01-23 DIAGNOSIS — R293 Abnormal posture: Secondary | ICD-10-CM | POA: Diagnosis not present

## 2019-01-23 DIAGNOSIS — R262 Difficulty in walking, not elsewhere classified: Secondary | ICD-10-CM | POA: Diagnosis not present

## 2019-01-23 DIAGNOSIS — M6281 Muscle weakness (generalized): Secondary | ICD-10-CM | POA: Diagnosis not present

## 2019-01-23 DIAGNOSIS — R41841 Cognitive communication deficit: Secondary | ICD-10-CM | POA: Diagnosis not present

## 2019-01-26 DIAGNOSIS — M6281 Muscle weakness (generalized): Secondary | ICD-10-CM | POA: Diagnosis not present

## 2019-01-26 DIAGNOSIS — R41841 Cognitive communication deficit: Secondary | ICD-10-CM | POA: Diagnosis not present

## 2019-01-26 DIAGNOSIS — R262 Difficulty in walking, not elsewhere classified: Secondary | ICD-10-CM | POA: Diagnosis not present

## 2019-01-26 DIAGNOSIS — R293 Abnormal posture: Secondary | ICD-10-CM | POA: Diagnosis not present

## 2019-01-27 DIAGNOSIS — R293 Abnormal posture: Secondary | ICD-10-CM | POA: Diagnosis not present

## 2019-01-27 DIAGNOSIS — R262 Difficulty in walking, not elsewhere classified: Secondary | ICD-10-CM | POA: Diagnosis not present

## 2019-01-27 DIAGNOSIS — M6281 Muscle weakness (generalized): Secondary | ICD-10-CM | POA: Diagnosis not present

## 2019-01-27 DIAGNOSIS — R41841 Cognitive communication deficit: Secondary | ICD-10-CM | POA: Diagnosis not present

## 2019-01-29 DIAGNOSIS — R262 Difficulty in walking, not elsewhere classified: Secondary | ICD-10-CM | POA: Diagnosis not present

## 2019-01-29 DIAGNOSIS — M6281 Muscle weakness (generalized): Secondary | ICD-10-CM | POA: Diagnosis not present

## 2019-01-29 DIAGNOSIS — R41841 Cognitive communication deficit: Secondary | ICD-10-CM | POA: Diagnosis not present

## 2019-01-29 DIAGNOSIS — R293 Abnormal posture: Secondary | ICD-10-CM | POA: Diagnosis not present

## 2019-01-30 DIAGNOSIS — M6281 Muscle weakness (generalized): Secondary | ICD-10-CM | POA: Diagnosis not present

## 2019-01-30 DIAGNOSIS — R41841 Cognitive communication deficit: Secondary | ICD-10-CM | POA: Diagnosis not present

## 2019-01-30 DIAGNOSIS — R498 Other voice and resonance disorders: Secondary | ICD-10-CM | POA: Diagnosis not present

## 2019-02-04 DIAGNOSIS — R498 Other voice and resonance disorders: Secondary | ICD-10-CM | POA: Diagnosis not present

## 2019-02-04 DIAGNOSIS — R41841 Cognitive communication deficit: Secondary | ICD-10-CM | POA: Diagnosis not present

## 2019-02-04 DIAGNOSIS — M6281 Muscle weakness (generalized): Secondary | ICD-10-CM | POA: Diagnosis not present

## 2019-02-05 DIAGNOSIS — R498 Other voice and resonance disorders: Secondary | ICD-10-CM | POA: Diagnosis not present

## 2019-02-05 DIAGNOSIS — M6281 Muscle weakness (generalized): Secondary | ICD-10-CM | POA: Diagnosis not present

## 2019-02-05 DIAGNOSIS — R41841 Cognitive communication deficit: Secondary | ICD-10-CM | POA: Diagnosis not present

## 2019-02-09 DIAGNOSIS — R41841 Cognitive communication deficit: Secondary | ICD-10-CM | POA: Diagnosis not present

## 2019-02-09 DIAGNOSIS — R498 Other voice and resonance disorders: Secondary | ICD-10-CM | POA: Diagnosis not present

## 2019-02-09 DIAGNOSIS — M6281 Muscle weakness (generalized): Secondary | ICD-10-CM | POA: Diagnosis not present

## 2019-02-10 DIAGNOSIS — M6281 Muscle weakness (generalized): Secondary | ICD-10-CM | POA: Diagnosis not present

## 2019-02-10 DIAGNOSIS — R41841 Cognitive communication deficit: Secondary | ICD-10-CM | POA: Diagnosis not present

## 2019-02-10 DIAGNOSIS — R498 Other voice and resonance disorders: Secondary | ICD-10-CM | POA: Diagnosis not present

## 2019-02-11 DIAGNOSIS — M6281 Muscle weakness (generalized): Secondary | ICD-10-CM | POA: Diagnosis not present

## 2019-02-11 DIAGNOSIS — R41841 Cognitive communication deficit: Secondary | ICD-10-CM | POA: Diagnosis not present

## 2019-02-11 DIAGNOSIS — R498 Other voice and resonance disorders: Secondary | ICD-10-CM | POA: Diagnosis not present

## 2019-02-12 DIAGNOSIS — Z79899 Other long term (current) drug therapy: Secondary | ICD-10-CM | POA: Diagnosis not present

## 2019-02-13 DIAGNOSIS — R498 Other voice and resonance disorders: Secondary | ICD-10-CM | POA: Diagnosis not present

## 2019-02-13 DIAGNOSIS — R41841 Cognitive communication deficit: Secondary | ICD-10-CM | POA: Diagnosis not present

## 2019-02-13 DIAGNOSIS — M6281 Muscle weakness (generalized): Secondary | ICD-10-CM | POA: Diagnosis not present

## 2019-02-16 DIAGNOSIS — R41841 Cognitive communication deficit: Secondary | ICD-10-CM | POA: Diagnosis not present

## 2019-02-16 DIAGNOSIS — M6281 Muscle weakness (generalized): Secondary | ICD-10-CM | POA: Diagnosis not present

## 2019-02-16 DIAGNOSIS — R498 Other voice and resonance disorders: Secondary | ICD-10-CM | POA: Diagnosis not present

## 2019-02-17 DIAGNOSIS — R498 Other voice and resonance disorders: Secondary | ICD-10-CM | POA: Diagnosis not present

## 2019-02-17 DIAGNOSIS — R41841 Cognitive communication deficit: Secondary | ICD-10-CM | POA: Diagnosis not present

## 2019-02-17 DIAGNOSIS — M6281 Muscle weakness (generalized): Secondary | ICD-10-CM | POA: Diagnosis not present

## 2019-02-19 DIAGNOSIS — R498 Other voice and resonance disorders: Secondary | ICD-10-CM | POA: Diagnosis not present

## 2019-02-19 DIAGNOSIS — M6281 Muscle weakness (generalized): Secondary | ICD-10-CM | POA: Diagnosis not present

## 2019-02-19 DIAGNOSIS — R41841 Cognitive communication deficit: Secondary | ICD-10-CM | POA: Diagnosis not present

## 2019-02-20 DIAGNOSIS — R498 Other voice and resonance disorders: Secondary | ICD-10-CM | POA: Diagnosis not present

## 2019-02-20 DIAGNOSIS — R41841 Cognitive communication deficit: Secondary | ICD-10-CM | POA: Diagnosis not present

## 2019-02-20 DIAGNOSIS — M6281 Muscle weakness (generalized): Secondary | ICD-10-CM | POA: Diagnosis not present

## 2019-02-25 DIAGNOSIS — M6281 Muscle weakness (generalized): Secondary | ICD-10-CM | POA: Diagnosis not present

## 2019-02-25 DIAGNOSIS — R41841 Cognitive communication deficit: Secondary | ICD-10-CM | POA: Diagnosis not present

## 2019-02-25 DIAGNOSIS — R498 Other voice and resonance disorders: Secondary | ICD-10-CM | POA: Diagnosis not present

## 2019-02-26 DIAGNOSIS — R498 Other voice and resonance disorders: Secondary | ICD-10-CM | POA: Diagnosis not present

## 2019-02-26 DIAGNOSIS — R7309 Other abnormal glucose: Secondary | ICD-10-CM | POA: Diagnosis not present

## 2019-02-26 DIAGNOSIS — I10 Essential (primary) hypertension: Secondary | ICD-10-CM | POA: Diagnosis not present

## 2019-02-26 DIAGNOSIS — R41841 Cognitive communication deficit: Secondary | ICD-10-CM | POA: Diagnosis not present

## 2019-02-26 DIAGNOSIS — Z79899 Other long term (current) drug therapy: Secondary | ICD-10-CM | POA: Diagnosis not present

## 2019-02-26 DIAGNOSIS — R350 Frequency of micturition: Secondary | ICD-10-CM | POA: Diagnosis not present

## 2019-02-26 DIAGNOSIS — F419 Anxiety disorder, unspecified: Secondary | ICD-10-CM | POA: Diagnosis not present

## 2019-02-26 DIAGNOSIS — F0390 Unspecified dementia without behavioral disturbance: Secondary | ICD-10-CM | POA: Diagnosis not present

## 2019-02-26 DIAGNOSIS — M6281 Muscle weakness (generalized): Secondary | ICD-10-CM | POA: Diagnosis not present

## 2019-02-27 DIAGNOSIS — R41841 Cognitive communication deficit: Secondary | ICD-10-CM | POA: Diagnosis not present

## 2019-02-27 DIAGNOSIS — M6281 Muscle weakness (generalized): Secondary | ICD-10-CM | POA: Diagnosis not present

## 2019-02-27 DIAGNOSIS — R498 Other voice and resonance disorders: Secondary | ICD-10-CM | POA: Diagnosis not present

## 2019-03-02 DIAGNOSIS — R41841 Cognitive communication deficit: Secondary | ICD-10-CM | POA: Diagnosis not present

## 2019-03-02 DIAGNOSIS — R498 Other voice and resonance disorders: Secondary | ICD-10-CM | POA: Diagnosis not present

## 2019-03-03 DIAGNOSIS — R498 Other voice and resonance disorders: Secondary | ICD-10-CM | POA: Diagnosis not present

## 2019-03-03 DIAGNOSIS — R41841 Cognitive communication deficit: Secondary | ICD-10-CM | POA: Diagnosis not present

## 2019-03-04 DIAGNOSIS — R41841 Cognitive communication deficit: Secondary | ICD-10-CM | POA: Diagnosis not present

## 2019-03-04 DIAGNOSIS — R498 Other voice and resonance disorders: Secondary | ICD-10-CM | POA: Diagnosis not present

## 2019-03-05 DIAGNOSIS — R498 Other voice and resonance disorders: Secondary | ICD-10-CM | POA: Diagnosis not present

## 2019-03-05 DIAGNOSIS — R41841 Cognitive communication deficit: Secondary | ICD-10-CM | POA: Diagnosis not present

## 2019-03-11 DIAGNOSIS — N390 Urinary tract infection, site not specified: Secondary | ICD-10-CM | POA: Diagnosis not present

## 2019-03-11 DIAGNOSIS — Z79899 Other long term (current) drug therapy: Secondary | ICD-10-CM | POA: Diagnosis not present

## 2019-03-12 DIAGNOSIS — R41841 Cognitive communication deficit: Secondary | ICD-10-CM | POA: Diagnosis not present

## 2019-03-12 DIAGNOSIS — R498 Other voice and resonance disorders: Secondary | ICD-10-CM | POA: Diagnosis not present

## 2019-03-13 DIAGNOSIS — R498 Other voice and resonance disorders: Secondary | ICD-10-CM | POA: Diagnosis not present

## 2019-03-13 DIAGNOSIS — R41841 Cognitive communication deficit: Secondary | ICD-10-CM | POA: Diagnosis not present

## 2019-03-14 DIAGNOSIS — F331 Major depressive disorder, recurrent, moderate: Secondary | ICD-10-CM | POA: Diagnosis not present

## 2019-03-14 DIAGNOSIS — F411 Generalized anxiety disorder: Secondary | ICD-10-CM | POA: Diagnosis not present

## 2019-03-16 DIAGNOSIS — R41841 Cognitive communication deficit: Secondary | ICD-10-CM | POA: Diagnosis not present

## 2019-03-16 DIAGNOSIS — R498 Other voice and resonance disorders: Secondary | ICD-10-CM | POA: Diagnosis not present

## 2019-03-17 DIAGNOSIS — R498 Other voice and resonance disorders: Secondary | ICD-10-CM | POA: Diagnosis not present

## 2019-03-17 DIAGNOSIS — R41841 Cognitive communication deficit: Secondary | ICD-10-CM | POA: Diagnosis not present

## 2019-03-19 DIAGNOSIS — R41841 Cognitive communication deficit: Secondary | ICD-10-CM | POA: Diagnosis not present

## 2019-03-19 DIAGNOSIS — R498 Other voice and resonance disorders: Secondary | ICD-10-CM | POA: Diagnosis not present

## 2019-03-20 DIAGNOSIS — R41841 Cognitive communication deficit: Secondary | ICD-10-CM | POA: Diagnosis not present

## 2019-03-20 DIAGNOSIS — R498 Other voice and resonance disorders: Secondary | ICD-10-CM | POA: Diagnosis not present

## 2019-03-21 DIAGNOSIS — F331 Major depressive disorder, recurrent, moderate: Secondary | ICD-10-CM | POA: Diagnosis not present

## 2019-03-23 DIAGNOSIS — R498 Other voice and resonance disorders: Secondary | ICD-10-CM | POA: Diagnosis not present

## 2019-03-23 DIAGNOSIS — R41841 Cognitive communication deficit: Secondary | ICD-10-CM | POA: Diagnosis not present

## 2019-03-24 DIAGNOSIS — R498 Other voice and resonance disorders: Secondary | ICD-10-CM | POA: Diagnosis not present

## 2019-03-24 DIAGNOSIS — R41841 Cognitive communication deficit: Secondary | ICD-10-CM | POA: Diagnosis not present

## 2019-03-25 DIAGNOSIS — R498 Other voice and resonance disorders: Secondary | ICD-10-CM | POA: Diagnosis not present

## 2019-03-25 DIAGNOSIS — R41841 Cognitive communication deficit: Secondary | ICD-10-CM | POA: Diagnosis not present

## 2019-03-26 DIAGNOSIS — R498 Other voice and resonance disorders: Secondary | ICD-10-CM | POA: Diagnosis not present

## 2019-03-26 DIAGNOSIS — R41841 Cognitive communication deficit: Secondary | ICD-10-CM | POA: Diagnosis not present

## 2019-03-27 DIAGNOSIS — R41841 Cognitive communication deficit: Secondary | ICD-10-CM | POA: Diagnosis not present

## 2019-03-27 DIAGNOSIS — R498 Other voice and resonance disorders: Secondary | ICD-10-CM | POA: Diagnosis not present

## 2019-03-28 DIAGNOSIS — F331 Major depressive disorder, recurrent, moderate: Secondary | ICD-10-CM | POA: Diagnosis not present

## 2019-03-30 DIAGNOSIS — R41841 Cognitive communication deficit: Secondary | ICD-10-CM | POA: Diagnosis not present

## 2019-03-30 DIAGNOSIS — R498 Other voice and resonance disorders: Secondary | ICD-10-CM | POA: Diagnosis not present

## 2019-03-31 DIAGNOSIS — R41841 Cognitive communication deficit: Secondary | ICD-10-CM | POA: Diagnosis not present

## 2019-03-31 DIAGNOSIS — R498 Other voice and resonance disorders: Secondary | ICD-10-CM | POA: Diagnosis not present

## 2019-04-02 DIAGNOSIS — R498 Other voice and resonance disorders: Secondary | ICD-10-CM | POA: Diagnosis not present

## 2019-04-02 DIAGNOSIS — R41841 Cognitive communication deficit: Secondary | ICD-10-CM | POA: Diagnosis not present

## 2019-04-03 DIAGNOSIS — R41841 Cognitive communication deficit: Secondary | ICD-10-CM | POA: Diagnosis not present

## 2019-04-03 DIAGNOSIS — R498 Other voice and resonance disorders: Secondary | ICD-10-CM | POA: Diagnosis not present

## 2019-04-06 DIAGNOSIS — I10 Essential (primary) hypertension: Secondary | ICD-10-CM | POA: Diagnosis not present

## 2019-04-06 DIAGNOSIS — F419 Anxiety disorder, unspecified: Secondary | ICD-10-CM | POA: Diagnosis not present

## 2019-04-06 DIAGNOSIS — R498 Other voice and resonance disorders: Secondary | ICD-10-CM | POA: Diagnosis not present

## 2019-04-06 DIAGNOSIS — R300 Dysuria: Secondary | ICD-10-CM | POA: Diagnosis not present

## 2019-04-06 DIAGNOSIS — F0390 Unspecified dementia without behavioral disturbance: Secondary | ICD-10-CM | POA: Diagnosis not present

## 2019-04-06 DIAGNOSIS — R319 Hematuria, unspecified: Secondary | ICD-10-CM | POA: Diagnosis not present

## 2019-04-06 DIAGNOSIS — Z79899 Other long term (current) drug therapy: Secondary | ICD-10-CM | POA: Diagnosis not present

## 2019-04-06 DIAGNOSIS — R41841 Cognitive communication deficit: Secondary | ICD-10-CM | POA: Diagnosis not present

## 2019-04-07 DIAGNOSIS — R498 Other voice and resonance disorders: Secondary | ICD-10-CM | POA: Diagnosis not present

## 2019-04-07 DIAGNOSIS — R41841 Cognitive communication deficit: Secondary | ICD-10-CM | POA: Diagnosis not present

## 2019-04-08 DIAGNOSIS — R498 Other voice and resonance disorders: Secondary | ICD-10-CM | POA: Diagnosis not present

## 2019-04-08 DIAGNOSIS — R41841 Cognitive communication deficit: Secondary | ICD-10-CM | POA: Diagnosis not present

## 2019-04-09 DIAGNOSIS — R41841 Cognitive communication deficit: Secondary | ICD-10-CM | POA: Diagnosis not present

## 2019-04-09 DIAGNOSIS — R498 Other voice and resonance disorders: Secondary | ICD-10-CM | POA: Diagnosis not present

## 2019-04-10 DIAGNOSIS — R498 Other voice and resonance disorders: Secondary | ICD-10-CM | POA: Diagnosis not present

## 2019-04-10 DIAGNOSIS — R41841 Cognitive communication deficit: Secondary | ICD-10-CM | POA: Diagnosis not present

## 2019-04-11 DIAGNOSIS — F331 Major depressive disorder, recurrent, moderate: Secondary | ICD-10-CM | POA: Diagnosis not present

## 2019-04-13 DIAGNOSIS — R41841 Cognitive communication deficit: Secondary | ICD-10-CM | POA: Diagnosis not present

## 2019-04-13 DIAGNOSIS — F0390 Unspecified dementia without behavioral disturbance: Secondary | ICD-10-CM | POA: Diagnosis not present

## 2019-04-13 DIAGNOSIS — F419 Anxiety disorder, unspecified: Secondary | ICD-10-CM | POA: Diagnosis not present

## 2019-04-13 DIAGNOSIS — R319 Hematuria, unspecified: Secondary | ICD-10-CM | POA: Diagnosis not present

## 2019-04-13 DIAGNOSIS — Z79899 Other long term (current) drug therapy: Secondary | ICD-10-CM | POA: Diagnosis not present

## 2019-04-13 DIAGNOSIS — R498 Other voice and resonance disorders: Secondary | ICD-10-CM | POA: Diagnosis not present

## 2019-04-13 DIAGNOSIS — R300 Dysuria: Secondary | ICD-10-CM | POA: Diagnosis not present

## 2019-04-13 DIAGNOSIS — I10 Essential (primary) hypertension: Secondary | ICD-10-CM | POA: Diagnosis not present

## 2019-04-15 DIAGNOSIS — R498 Other voice and resonance disorders: Secondary | ICD-10-CM | POA: Diagnosis not present

## 2019-04-15 DIAGNOSIS — R41841 Cognitive communication deficit: Secondary | ICD-10-CM | POA: Diagnosis not present

## 2019-04-16 DIAGNOSIS — Z79899 Other long term (current) drug therapy: Secondary | ICD-10-CM | POA: Diagnosis not present

## 2019-04-16 DIAGNOSIS — R498 Other voice and resonance disorders: Secondary | ICD-10-CM | POA: Diagnosis not present

## 2019-04-16 DIAGNOSIS — N390 Urinary tract infection, site not specified: Secondary | ICD-10-CM | POA: Diagnosis not present

## 2019-04-16 DIAGNOSIS — R41841 Cognitive communication deficit: Secondary | ICD-10-CM | POA: Diagnosis not present

## 2019-04-30 ENCOUNTER — Other Ambulatory Visit: Payer: Self-pay

## 2019-06-04 DIAGNOSIS — M419 Scoliosis, unspecified: Secondary | ICD-10-CM | POA: Diagnosis not present

## 2019-06-04 DIAGNOSIS — R269 Unspecified abnormalities of gait and mobility: Secondary | ICD-10-CM | POA: Diagnosis not present

## 2019-06-04 DIAGNOSIS — R262 Difficulty in walking, not elsewhere classified: Secondary | ICD-10-CM | POA: Diagnosis not present

## 2019-06-04 DIAGNOSIS — E1369 Other specified diabetes mellitus with other specified complication: Secondary | ICD-10-CM | POA: Diagnosis not present

## 2019-06-04 DIAGNOSIS — I10 Essential (primary) hypertension: Secondary | ICD-10-CM | POA: Diagnosis not present

## 2019-06-04 DIAGNOSIS — R293 Abnormal posture: Secondary | ICD-10-CM | POA: Diagnosis not present

## 2019-06-04 DIAGNOSIS — J3800 Paralysis of vocal cords and larynx, unspecified: Secondary | ICD-10-CM | POA: Diagnosis not present

## 2019-06-04 DIAGNOSIS — G309 Alzheimer's disease, unspecified: Secondary | ICD-10-CM | POA: Diagnosis not present

## 2019-06-04 DIAGNOSIS — R41841 Cognitive communication deficit: Secondary | ICD-10-CM | POA: Diagnosis not present

## 2019-06-04 DIAGNOSIS — N39498 Other specified urinary incontinence: Secondary | ICD-10-CM | POA: Diagnosis not present

## 2019-06-04 DIAGNOSIS — M6281 Muscle weakness (generalized): Secondary | ICD-10-CM | POA: Diagnosis not present

## 2019-06-04 DIAGNOSIS — R2681 Unsteadiness on feet: Secondary | ICD-10-CM | POA: Diagnosis not present

## 2019-06-04 DIAGNOSIS — F0390 Unspecified dementia without behavioral disturbance: Secondary | ICD-10-CM | POA: Diagnosis not present

## 2019-06-04 DIAGNOSIS — K219 Gastro-esophageal reflux disease without esophagitis: Secondary | ICD-10-CM | POA: Diagnosis not present

## 2019-06-04 DIAGNOSIS — R488 Other symbolic dysfunctions: Secondary | ICD-10-CM | POA: Diagnosis not present

## 2019-06-04 DIAGNOSIS — M4124 Other idiopathic scoliosis, thoracic region: Secondary | ICD-10-CM | POA: Diagnosis not present

## 2019-06-04 DIAGNOSIS — G308 Other Alzheimer's disease: Secondary | ICD-10-CM | POA: Diagnosis not present

## 2019-06-05 DIAGNOSIS — M6281 Muscle weakness (generalized): Secondary | ICD-10-CM | POA: Diagnosis not present

## 2019-06-05 DIAGNOSIS — R488 Other symbolic dysfunctions: Secondary | ICD-10-CM | POA: Diagnosis not present

## 2019-06-05 DIAGNOSIS — R293 Abnormal posture: Secondary | ICD-10-CM | POA: Diagnosis not present

## 2019-06-05 DIAGNOSIS — R262 Difficulty in walking, not elsewhere classified: Secondary | ICD-10-CM | POA: Diagnosis not present

## 2019-06-05 DIAGNOSIS — R2681 Unsteadiness on feet: Secondary | ICD-10-CM | POA: Diagnosis not present

## 2019-06-05 DIAGNOSIS — F0390 Unspecified dementia without behavioral disturbance: Secondary | ICD-10-CM | POA: Diagnosis not present

## 2019-06-10 DIAGNOSIS — F0390 Unspecified dementia without behavioral disturbance: Secondary | ICD-10-CM | POA: Diagnosis not present

## 2019-06-10 DIAGNOSIS — R488 Other symbolic dysfunctions: Secondary | ICD-10-CM | POA: Diagnosis not present

## 2019-06-10 DIAGNOSIS — R293 Abnormal posture: Secondary | ICD-10-CM | POA: Diagnosis not present

## 2019-06-10 DIAGNOSIS — R2681 Unsteadiness on feet: Secondary | ICD-10-CM | POA: Diagnosis not present

## 2019-06-10 DIAGNOSIS — R262 Difficulty in walking, not elsewhere classified: Secondary | ICD-10-CM | POA: Diagnosis not present

## 2019-06-10 DIAGNOSIS — M6281 Muscle weakness (generalized): Secondary | ICD-10-CM | POA: Diagnosis not present

## 2019-06-11 DIAGNOSIS — M419 Scoliosis, unspecified: Secondary | ICD-10-CM | POA: Diagnosis not present

## 2019-06-11 DIAGNOSIS — R488 Other symbolic dysfunctions: Secondary | ICD-10-CM | POA: Diagnosis not present

## 2019-06-11 DIAGNOSIS — N39498 Other specified urinary incontinence: Secondary | ICD-10-CM | POA: Diagnosis not present

## 2019-06-11 DIAGNOSIS — R2681 Unsteadiness on feet: Secondary | ICD-10-CM | POA: Diagnosis not present

## 2019-06-11 DIAGNOSIS — R269 Unspecified abnormalities of gait and mobility: Secondary | ICD-10-CM | POA: Diagnosis not present

## 2019-06-11 DIAGNOSIS — Z111 Encounter for screening for respiratory tuberculosis: Secondary | ICD-10-CM | POA: Diagnosis not present

## 2019-06-11 DIAGNOSIS — F0390 Unspecified dementia without behavioral disturbance: Secondary | ICD-10-CM | POA: Diagnosis not present

## 2019-06-11 DIAGNOSIS — I10 Essential (primary) hypertension: Secondary | ICD-10-CM | POA: Diagnosis not present

## 2019-06-11 DIAGNOSIS — R262 Difficulty in walking, not elsewhere classified: Secondary | ICD-10-CM | POA: Diagnosis not present

## 2019-06-11 DIAGNOSIS — R293 Abnormal posture: Secondary | ICD-10-CM | POA: Diagnosis not present

## 2019-06-11 DIAGNOSIS — M6281 Muscle weakness (generalized): Secondary | ICD-10-CM | POA: Diagnosis not present

## 2019-06-11 DIAGNOSIS — E1369 Other specified diabetes mellitus with other specified complication: Secondary | ICD-10-CM | POA: Diagnosis not present

## 2019-06-11 DIAGNOSIS — G308 Other Alzheimer's disease: Secondary | ICD-10-CM | POA: Diagnosis not present

## 2019-06-12 DIAGNOSIS — R293 Abnormal posture: Secondary | ICD-10-CM | POA: Diagnosis not present

## 2019-06-12 DIAGNOSIS — R488 Other symbolic dysfunctions: Secondary | ICD-10-CM | POA: Diagnosis not present

## 2019-06-12 DIAGNOSIS — M6281 Muscle weakness (generalized): Secondary | ICD-10-CM | POA: Diagnosis not present

## 2019-06-12 DIAGNOSIS — Z111 Encounter for screening for respiratory tuberculosis: Secondary | ICD-10-CM | POA: Diagnosis not present

## 2019-06-12 DIAGNOSIS — R2681 Unsteadiness on feet: Secondary | ICD-10-CM | POA: Diagnosis not present

## 2019-06-12 DIAGNOSIS — R262 Difficulty in walking, not elsewhere classified: Secondary | ICD-10-CM | POA: Diagnosis not present

## 2019-06-12 DIAGNOSIS — I517 Cardiomegaly: Secondary | ICD-10-CM | POA: Diagnosis not present

## 2019-06-12 DIAGNOSIS — F0390 Unspecified dementia without behavioral disturbance: Secondary | ICD-10-CM | POA: Diagnosis not present

## 2019-06-13 DIAGNOSIS — R488 Other symbolic dysfunctions: Secondary | ICD-10-CM | POA: Diagnosis not present

## 2019-06-13 DIAGNOSIS — R262 Difficulty in walking, not elsewhere classified: Secondary | ICD-10-CM | POA: Diagnosis not present

## 2019-06-13 DIAGNOSIS — F0390 Unspecified dementia without behavioral disturbance: Secondary | ICD-10-CM | POA: Diagnosis not present

## 2019-06-13 DIAGNOSIS — M6281 Muscle weakness (generalized): Secondary | ICD-10-CM | POA: Diagnosis not present

## 2019-06-13 DIAGNOSIS — R2681 Unsteadiness on feet: Secondary | ICD-10-CM | POA: Diagnosis not present

## 2019-06-13 DIAGNOSIS — R293 Abnormal posture: Secondary | ICD-10-CM | POA: Diagnosis not present

## 2019-06-15 DIAGNOSIS — R2681 Unsteadiness on feet: Secondary | ICD-10-CM | POA: Diagnosis not present

## 2019-06-15 DIAGNOSIS — M6281 Muscle weakness (generalized): Secondary | ICD-10-CM | POA: Diagnosis not present

## 2019-06-15 DIAGNOSIS — F0390 Unspecified dementia without behavioral disturbance: Secondary | ICD-10-CM | POA: Diagnosis not present

## 2019-06-15 DIAGNOSIS — R488 Other symbolic dysfunctions: Secondary | ICD-10-CM | POA: Diagnosis not present

## 2019-06-15 DIAGNOSIS — R262 Difficulty in walking, not elsewhere classified: Secondary | ICD-10-CM | POA: Diagnosis not present

## 2019-06-15 DIAGNOSIS — R293 Abnormal posture: Secondary | ICD-10-CM | POA: Diagnosis not present

## 2019-06-16 DIAGNOSIS — R2681 Unsteadiness on feet: Secondary | ICD-10-CM | POA: Diagnosis not present

## 2019-06-16 DIAGNOSIS — M6281 Muscle weakness (generalized): Secondary | ICD-10-CM | POA: Diagnosis not present

## 2019-06-16 DIAGNOSIS — R262 Difficulty in walking, not elsewhere classified: Secondary | ICD-10-CM | POA: Diagnosis not present

## 2019-06-16 DIAGNOSIS — R488 Other symbolic dysfunctions: Secondary | ICD-10-CM | POA: Diagnosis not present

## 2019-06-16 DIAGNOSIS — F0390 Unspecified dementia without behavioral disturbance: Secondary | ICD-10-CM | POA: Diagnosis not present

## 2019-06-16 DIAGNOSIS — R293 Abnormal posture: Secondary | ICD-10-CM | POA: Diagnosis not present

## 2019-06-17 DIAGNOSIS — M6281 Muscle weakness (generalized): Secondary | ICD-10-CM | POA: Diagnosis not present

## 2019-06-17 DIAGNOSIS — R488 Other symbolic dysfunctions: Secondary | ICD-10-CM | POA: Diagnosis not present

## 2019-06-17 DIAGNOSIS — R293 Abnormal posture: Secondary | ICD-10-CM | POA: Diagnosis not present

## 2019-06-17 DIAGNOSIS — R262 Difficulty in walking, not elsewhere classified: Secondary | ICD-10-CM | POA: Diagnosis not present

## 2019-06-17 DIAGNOSIS — F0390 Unspecified dementia without behavioral disturbance: Secondary | ICD-10-CM | POA: Diagnosis not present

## 2019-06-17 DIAGNOSIS — R2681 Unsteadiness on feet: Secondary | ICD-10-CM | POA: Diagnosis not present

## 2019-06-18 DIAGNOSIS — R2681 Unsteadiness on feet: Secondary | ICD-10-CM | POA: Diagnosis not present

## 2019-06-18 DIAGNOSIS — F0390 Unspecified dementia without behavioral disturbance: Secondary | ICD-10-CM | POA: Diagnosis not present

## 2019-06-18 DIAGNOSIS — M6281 Muscle weakness (generalized): Secondary | ICD-10-CM | POA: Diagnosis not present

## 2019-06-18 DIAGNOSIS — R262 Difficulty in walking, not elsewhere classified: Secondary | ICD-10-CM | POA: Diagnosis not present

## 2019-06-18 DIAGNOSIS — R293 Abnormal posture: Secondary | ICD-10-CM | POA: Diagnosis not present

## 2019-06-18 DIAGNOSIS — R488 Other symbolic dysfunctions: Secondary | ICD-10-CM | POA: Diagnosis not present

## 2019-06-19 DIAGNOSIS — R488 Other symbolic dysfunctions: Secondary | ICD-10-CM | POA: Diagnosis not present

## 2019-06-19 DIAGNOSIS — R2681 Unsteadiness on feet: Secondary | ICD-10-CM | POA: Diagnosis not present

## 2019-06-19 DIAGNOSIS — F0390 Unspecified dementia without behavioral disturbance: Secondary | ICD-10-CM | POA: Diagnosis not present

## 2019-06-19 DIAGNOSIS — R293 Abnormal posture: Secondary | ICD-10-CM | POA: Diagnosis not present

## 2019-06-19 DIAGNOSIS — M6281 Muscle weakness (generalized): Secondary | ICD-10-CM | POA: Diagnosis not present

## 2019-06-19 DIAGNOSIS — R262 Difficulty in walking, not elsewhere classified: Secondary | ICD-10-CM | POA: Diagnosis not present

## 2019-06-22 DIAGNOSIS — M6281 Muscle weakness (generalized): Secondary | ICD-10-CM | POA: Diagnosis not present

## 2019-06-22 DIAGNOSIS — R2681 Unsteadiness on feet: Secondary | ICD-10-CM | POA: Diagnosis not present

## 2019-06-22 DIAGNOSIS — R293 Abnormal posture: Secondary | ICD-10-CM | POA: Diagnosis not present

## 2019-06-22 DIAGNOSIS — F0390 Unspecified dementia without behavioral disturbance: Secondary | ICD-10-CM | POA: Diagnosis not present

## 2019-06-22 DIAGNOSIS — R488 Other symbolic dysfunctions: Secondary | ICD-10-CM | POA: Diagnosis not present

## 2019-06-22 DIAGNOSIS — R262 Difficulty in walking, not elsewhere classified: Secondary | ICD-10-CM | POA: Diagnosis not present

## 2019-06-23 DIAGNOSIS — F0390 Unspecified dementia without behavioral disturbance: Secondary | ICD-10-CM | POA: Diagnosis not present

## 2019-06-23 DIAGNOSIS — R488 Other symbolic dysfunctions: Secondary | ICD-10-CM | POA: Diagnosis not present

## 2019-06-23 DIAGNOSIS — R293 Abnormal posture: Secondary | ICD-10-CM | POA: Diagnosis not present

## 2019-06-23 DIAGNOSIS — R262 Difficulty in walking, not elsewhere classified: Secondary | ICD-10-CM | POA: Diagnosis not present

## 2019-06-23 DIAGNOSIS — R2681 Unsteadiness on feet: Secondary | ICD-10-CM | POA: Diagnosis not present

## 2019-06-23 DIAGNOSIS — M6281 Muscle weakness (generalized): Secondary | ICD-10-CM | POA: Diagnosis not present

## 2019-06-24 DIAGNOSIS — F0390 Unspecified dementia without behavioral disturbance: Secondary | ICD-10-CM | POA: Diagnosis not present

## 2019-06-24 DIAGNOSIS — R2681 Unsteadiness on feet: Secondary | ICD-10-CM | POA: Diagnosis not present

## 2019-06-24 DIAGNOSIS — R262 Difficulty in walking, not elsewhere classified: Secondary | ICD-10-CM | POA: Diagnosis not present

## 2019-06-24 DIAGNOSIS — R488 Other symbolic dysfunctions: Secondary | ICD-10-CM | POA: Diagnosis not present

## 2019-06-24 DIAGNOSIS — M6281 Muscle weakness (generalized): Secondary | ICD-10-CM | POA: Diagnosis not present

## 2019-06-24 DIAGNOSIS — R293 Abnormal posture: Secondary | ICD-10-CM | POA: Diagnosis not present

## 2019-06-25 DIAGNOSIS — R293 Abnormal posture: Secondary | ICD-10-CM | POA: Diagnosis not present

## 2019-06-25 DIAGNOSIS — R262 Difficulty in walking, not elsewhere classified: Secondary | ICD-10-CM | POA: Diagnosis not present

## 2019-06-25 DIAGNOSIS — R269 Unspecified abnormalities of gait and mobility: Secondary | ICD-10-CM | POA: Diagnosis not present

## 2019-06-25 DIAGNOSIS — E1369 Other specified diabetes mellitus with other specified complication: Secondary | ICD-10-CM | POA: Diagnosis not present

## 2019-06-25 DIAGNOSIS — N39498 Other specified urinary incontinence: Secondary | ICD-10-CM | POA: Diagnosis not present

## 2019-06-25 DIAGNOSIS — R2681 Unsteadiness on feet: Secondary | ICD-10-CM | POA: Diagnosis not present

## 2019-06-25 DIAGNOSIS — F0390 Unspecified dementia without behavioral disturbance: Secondary | ICD-10-CM | POA: Diagnosis not present

## 2019-06-25 DIAGNOSIS — G308 Other Alzheimer's disease: Secondary | ICD-10-CM | POA: Diagnosis not present

## 2019-06-25 DIAGNOSIS — M6281 Muscle weakness (generalized): Secondary | ICD-10-CM | POA: Diagnosis not present

## 2019-06-25 DIAGNOSIS — R488 Other symbolic dysfunctions: Secondary | ICD-10-CM | POA: Diagnosis not present

## 2019-06-25 DIAGNOSIS — R300 Dysuria: Secondary | ICD-10-CM | POA: Diagnosis not present

## 2019-06-25 DIAGNOSIS — L293 Anogenital pruritus, unspecified: Secondary | ICD-10-CM | POA: Diagnosis not present

## 2019-06-25 DIAGNOSIS — I10 Essential (primary) hypertension: Secondary | ICD-10-CM | POA: Diagnosis not present

## 2019-06-26 DIAGNOSIS — F0390 Unspecified dementia without behavioral disturbance: Secondary | ICD-10-CM | POA: Diagnosis not present

## 2019-06-26 DIAGNOSIS — R293 Abnormal posture: Secondary | ICD-10-CM | POA: Diagnosis not present

## 2019-06-26 DIAGNOSIS — R488 Other symbolic dysfunctions: Secondary | ICD-10-CM | POA: Diagnosis not present

## 2019-06-26 DIAGNOSIS — M6281 Muscle weakness (generalized): Secondary | ICD-10-CM | POA: Diagnosis not present

## 2019-06-26 DIAGNOSIS — R2681 Unsteadiness on feet: Secondary | ICD-10-CM | POA: Diagnosis not present

## 2019-06-26 DIAGNOSIS — R262 Difficulty in walking, not elsewhere classified: Secondary | ICD-10-CM | POA: Diagnosis not present

## 2019-06-29 DIAGNOSIS — R488 Other symbolic dysfunctions: Secondary | ICD-10-CM | POA: Diagnosis not present

## 2019-06-29 DIAGNOSIS — R262 Difficulty in walking, not elsewhere classified: Secondary | ICD-10-CM | POA: Diagnosis not present

## 2019-06-29 DIAGNOSIS — R293 Abnormal posture: Secondary | ICD-10-CM | POA: Diagnosis not present

## 2019-06-29 DIAGNOSIS — F0390 Unspecified dementia without behavioral disturbance: Secondary | ICD-10-CM | POA: Diagnosis not present

## 2019-06-29 DIAGNOSIS — M6281 Muscle weakness (generalized): Secondary | ICD-10-CM | POA: Diagnosis not present

## 2019-06-29 DIAGNOSIS — R2681 Unsteadiness on feet: Secondary | ICD-10-CM | POA: Diagnosis not present

## 2019-06-30 DIAGNOSIS — R2681 Unsteadiness on feet: Secondary | ICD-10-CM | POA: Diagnosis not present

## 2019-06-30 DIAGNOSIS — R293 Abnormal posture: Secondary | ICD-10-CM | POA: Diagnosis not present

## 2019-06-30 DIAGNOSIS — R262 Difficulty in walking, not elsewhere classified: Secondary | ICD-10-CM | POA: Diagnosis not present

## 2019-06-30 DIAGNOSIS — M6281 Muscle weakness (generalized): Secondary | ICD-10-CM | POA: Diagnosis not present

## 2019-06-30 DIAGNOSIS — F0390 Unspecified dementia without behavioral disturbance: Secondary | ICD-10-CM | POA: Diagnosis not present

## 2019-06-30 DIAGNOSIS — R488 Other symbolic dysfunctions: Secondary | ICD-10-CM | POA: Diagnosis not present

## 2019-07-01 DIAGNOSIS — F0390 Unspecified dementia without behavioral disturbance: Secondary | ICD-10-CM | POA: Diagnosis not present

## 2019-07-01 DIAGNOSIS — R293 Abnormal posture: Secondary | ICD-10-CM | POA: Diagnosis not present

## 2019-07-01 DIAGNOSIS — R2681 Unsteadiness on feet: Secondary | ICD-10-CM | POA: Diagnosis not present

## 2019-07-01 DIAGNOSIS — R488 Other symbolic dysfunctions: Secondary | ICD-10-CM | POA: Diagnosis not present

## 2019-07-01 DIAGNOSIS — R262 Difficulty in walking, not elsewhere classified: Secondary | ICD-10-CM | POA: Diagnosis not present

## 2019-07-01 DIAGNOSIS — M6281 Muscle weakness (generalized): Secondary | ICD-10-CM | POA: Diagnosis not present

## 2019-07-02 DIAGNOSIS — M6281 Muscle weakness (generalized): Secondary | ICD-10-CM | POA: Diagnosis not present

## 2019-07-02 DIAGNOSIS — R488 Other symbolic dysfunctions: Secondary | ICD-10-CM | POA: Diagnosis not present

## 2019-07-02 DIAGNOSIS — R41841 Cognitive communication deficit: Secondary | ICD-10-CM | POA: Diagnosis not present

## 2019-07-02 DIAGNOSIS — R262 Difficulty in walking, not elsewhere classified: Secondary | ICD-10-CM | POA: Diagnosis not present

## 2019-07-02 DIAGNOSIS — F0390 Unspecified dementia without behavioral disturbance: Secondary | ICD-10-CM | POA: Diagnosis not present

## 2019-07-02 DIAGNOSIS — M4124 Other idiopathic scoliosis, thoracic region: Secondary | ICD-10-CM | POA: Diagnosis not present

## 2019-07-02 DIAGNOSIS — K219 Gastro-esophageal reflux disease without esophagitis: Secondary | ICD-10-CM | POA: Diagnosis not present

## 2019-07-02 DIAGNOSIS — R293 Abnormal posture: Secondary | ICD-10-CM | POA: Diagnosis not present

## 2019-07-02 DIAGNOSIS — J3800 Paralysis of vocal cords and larynx, unspecified: Secondary | ICD-10-CM | POA: Diagnosis not present

## 2019-07-02 DIAGNOSIS — G309 Alzheimer's disease, unspecified: Secondary | ICD-10-CM | POA: Diagnosis not present

## 2019-07-02 DIAGNOSIS — R2681 Unsteadiness on feet: Secondary | ICD-10-CM | POA: Diagnosis not present

## 2019-07-03 DIAGNOSIS — R293 Abnormal posture: Secondary | ICD-10-CM | POA: Diagnosis not present

## 2019-07-03 DIAGNOSIS — M6281 Muscle weakness (generalized): Secondary | ICD-10-CM | POA: Diagnosis not present

## 2019-07-03 DIAGNOSIS — R262 Difficulty in walking, not elsewhere classified: Secondary | ICD-10-CM | POA: Diagnosis not present

## 2019-07-03 DIAGNOSIS — R488 Other symbolic dysfunctions: Secondary | ICD-10-CM | POA: Diagnosis not present

## 2019-07-03 DIAGNOSIS — R2681 Unsteadiness on feet: Secondary | ICD-10-CM | POA: Diagnosis not present

## 2019-07-03 DIAGNOSIS — F0390 Unspecified dementia without behavioral disturbance: Secondary | ICD-10-CM | POA: Diagnosis not present

## 2019-07-06 DIAGNOSIS — R262 Difficulty in walking, not elsewhere classified: Secondary | ICD-10-CM | POA: Diagnosis not present

## 2019-07-06 DIAGNOSIS — R293 Abnormal posture: Secondary | ICD-10-CM | POA: Diagnosis not present

## 2019-07-06 DIAGNOSIS — M6281 Muscle weakness (generalized): Secondary | ICD-10-CM | POA: Diagnosis not present

## 2019-07-06 DIAGNOSIS — R488 Other symbolic dysfunctions: Secondary | ICD-10-CM | POA: Diagnosis not present

## 2019-07-06 DIAGNOSIS — R2681 Unsteadiness on feet: Secondary | ICD-10-CM | POA: Diagnosis not present

## 2019-07-06 DIAGNOSIS — F0390 Unspecified dementia without behavioral disturbance: Secondary | ICD-10-CM | POA: Diagnosis not present

## 2019-07-07 DIAGNOSIS — R293 Abnormal posture: Secondary | ICD-10-CM | POA: Diagnosis not present

## 2019-07-07 DIAGNOSIS — R488 Other symbolic dysfunctions: Secondary | ICD-10-CM | POA: Diagnosis not present

## 2019-07-07 DIAGNOSIS — R262 Difficulty in walking, not elsewhere classified: Secondary | ICD-10-CM | POA: Diagnosis not present

## 2019-07-07 DIAGNOSIS — F0390 Unspecified dementia without behavioral disturbance: Secondary | ICD-10-CM | POA: Diagnosis not present

## 2019-07-07 DIAGNOSIS — M6281 Muscle weakness (generalized): Secondary | ICD-10-CM | POA: Diagnosis not present

## 2019-07-07 DIAGNOSIS — R2681 Unsteadiness on feet: Secondary | ICD-10-CM | POA: Diagnosis not present

## 2019-07-08 DIAGNOSIS — R488 Other symbolic dysfunctions: Secondary | ICD-10-CM | POA: Diagnosis not present

## 2019-07-08 DIAGNOSIS — M6281 Muscle weakness (generalized): Secondary | ICD-10-CM | POA: Diagnosis not present

## 2019-07-08 DIAGNOSIS — R293 Abnormal posture: Secondary | ICD-10-CM | POA: Diagnosis not present

## 2019-07-08 DIAGNOSIS — F0390 Unspecified dementia without behavioral disturbance: Secondary | ICD-10-CM | POA: Diagnosis not present

## 2019-07-08 DIAGNOSIS — R2681 Unsteadiness on feet: Secondary | ICD-10-CM | POA: Diagnosis not present

## 2019-07-08 DIAGNOSIS — R262 Difficulty in walking, not elsewhere classified: Secondary | ICD-10-CM | POA: Diagnosis not present

## 2019-07-09 DIAGNOSIS — R488 Other symbolic dysfunctions: Secondary | ICD-10-CM | POA: Diagnosis not present

## 2019-07-09 DIAGNOSIS — R2681 Unsteadiness on feet: Secondary | ICD-10-CM | POA: Diagnosis not present

## 2019-07-09 DIAGNOSIS — F0390 Unspecified dementia without behavioral disturbance: Secondary | ICD-10-CM | POA: Diagnosis not present

## 2019-07-09 DIAGNOSIS — R293 Abnormal posture: Secondary | ICD-10-CM | POA: Diagnosis not present

## 2019-07-09 DIAGNOSIS — R262 Difficulty in walking, not elsewhere classified: Secondary | ICD-10-CM | POA: Diagnosis not present

## 2019-07-09 DIAGNOSIS — M6281 Muscle weakness (generalized): Secondary | ICD-10-CM | POA: Diagnosis not present

## 2019-07-10 DIAGNOSIS — F0390 Unspecified dementia without behavioral disturbance: Secondary | ICD-10-CM | POA: Diagnosis not present

## 2019-07-10 DIAGNOSIS — R488 Other symbolic dysfunctions: Secondary | ICD-10-CM | POA: Diagnosis not present

## 2019-07-10 DIAGNOSIS — R293 Abnormal posture: Secondary | ICD-10-CM | POA: Diagnosis not present

## 2019-07-10 DIAGNOSIS — M6281 Muscle weakness (generalized): Secondary | ICD-10-CM | POA: Diagnosis not present

## 2019-07-10 DIAGNOSIS — R262 Difficulty in walking, not elsewhere classified: Secondary | ICD-10-CM | POA: Diagnosis not present

## 2019-07-10 DIAGNOSIS — R2681 Unsteadiness on feet: Secondary | ICD-10-CM | POA: Diagnosis not present

## 2019-07-13 DIAGNOSIS — R262 Difficulty in walking, not elsewhere classified: Secondary | ICD-10-CM | POA: Diagnosis not present

## 2019-07-13 DIAGNOSIS — F0390 Unspecified dementia without behavioral disturbance: Secondary | ICD-10-CM | POA: Diagnosis not present

## 2019-07-13 DIAGNOSIS — R2681 Unsteadiness on feet: Secondary | ICD-10-CM | POA: Diagnosis not present

## 2019-07-13 DIAGNOSIS — M6281 Muscle weakness (generalized): Secondary | ICD-10-CM | POA: Diagnosis not present

## 2019-07-13 DIAGNOSIS — R488 Other symbolic dysfunctions: Secondary | ICD-10-CM | POA: Diagnosis not present

## 2019-07-13 DIAGNOSIS — R293 Abnormal posture: Secondary | ICD-10-CM | POA: Diagnosis not present

## 2019-07-14 DIAGNOSIS — F0390 Unspecified dementia without behavioral disturbance: Secondary | ICD-10-CM | POA: Diagnosis not present

## 2019-07-14 DIAGNOSIS — R488 Other symbolic dysfunctions: Secondary | ICD-10-CM | POA: Diagnosis not present

## 2019-07-14 DIAGNOSIS — R262 Difficulty in walking, not elsewhere classified: Secondary | ICD-10-CM | POA: Diagnosis not present

## 2019-07-14 DIAGNOSIS — M6281 Muscle weakness (generalized): Secondary | ICD-10-CM | POA: Diagnosis not present

## 2019-07-14 DIAGNOSIS — R293 Abnormal posture: Secondary | ICD-10-CM | POA: Diagnosis not present

## 2019-07-14 DIAGNOSIS — R2681 Unsteadiness on feet: Secondary | ICD-10-CM | POA: Diagnosis not present

## 2019-07-15 DIAGNOSIS — R488 Other symbolic dysfunctions: Secondary | ICD-10-CM | POA: Diagnosis not present

## 2019-07-15 DIAGNOSIS — R2681 Unsteadiness on feet: Secondary | ICD-10-CM | POA: Diagnosis not present

## 2019-07-15 DIAGNOSIS — F0390 Unspecified dementia without behavioral disturbance: Secondary | ICD-10-CM | POA: Diagnosis not present

## 2019-07-15 DIAGNOSIS — R262 Difficulty in walking, not elsewhere classified: Secondary | ICD-10-CM | POA: Diagnosis not present

## 2019-07-15 DIAGNOSIS — M6281 Muscle weakness (generalized): Secondary | ICD-10-CM | POA: Diagnosis not present

## 2019-07-15 DIAGNOSIS — R293 Abnormal posture: Secondary | ICD-10-CM | POA: Diagnosis not present

## 2019-07-16 DIAGNOSIS — F0390 Unspecified dementia without behavioral disturbance: Secondary | ICD-10-CM | POA: Diagnosis not present

## 2019-07-16 DIAGNOSIS — R488 Other symbolic dysfunctions: Secondary | ICD-10-CM | POA: Diagnosis not present

## 2019-07-16 DIAGNOSIS — M6281 Muscle weakness (generalized): Secondary | ICD-10-CM | POA: Diagnosis not present

## 2019-07-16 DIAGNOSIS — R262 Difficulty in walking, not elsewhere classified: Secondary | ICD-10-CM | POA: Diagnosis not present

## 2019-07-16 DIAGNOSIS — R293 Abnormal posture: Secondary | ICD-10-CM | POA: Diagnosis not present

## 2019-07-16 DIAGNOSIS — R2681 Unsteadiness on feet: Secondary | ICD-10-CM | POA: Diagnosis not present

## 2019-07-17 DIAGNOSIS — R488 Other symbolic dysfunctions: Secondary | ICD-10-CM | POA: Diagnosis not present

## 2019-07-17 DIAGNOSIS — R2681 Unsteadiness on feet: Secondary | ICD-10-CM | POA: Diagnosis not present

## 2019-07-17 DIAGNOSIS — M6281 Muscle weakness (generalized): Secondary | ICD-10-CM | POA: Diagnosis not present

## 2019-07-17 DIAGNOSIS — R293 Abnormal posture: Secondary | ICD-10-CM | POA: Diagnosis not present

## 2019-07-17 DIAGNOSIS — F0390 Unspecified dementia without behavioral disturbance: Secondary | ICD-10-CM | POA: Diagnosis not present

## 2019-07-17 DIAGNOSIS — R262 Difficulty in walking, not elsewhere classified: Secondary | ICD-10-CM | POA: Diagnosis not present

## 2019-07-18 DIAGNOSIS — Z20828 Contact with and (suspected) exposure to other viral communicable diseases: Secondary | ICD-10-CM | POA: Diagnosis not present

## 2019-07-20 DIAGNOSIS — F0390 Unspecified dementia without behavioral disturbance: Secondary | ICD-10-CM | POA: Diagnosis not present

## 2019-07-20 DIAGNOSIS — R2681 Unsteadiness on feet: Secondary | ICD-10-CM | POA: Diagnosis not present

## 2019-07-20 DIAGNOSIS — R293 Abnormal posture: Secondary | ICD-10-CM | POA: Diagnosis not present

## 2019-07-20 DIAGNOSIS — M6281 Muscle weakness (generalized): Secondary | ICD-10-CM | POA: Diagnosis not present

## 2019-07-20 DIAGNOSIS — R262 Difficulty in walking, not elsewhere classified: Secondary | ICD-10-CM | POA: Diagnosis not present

## 2019-07-20 DIAGNOSIS — R488 Other symbolic dysfunctions: Secondary | ICD-10-CM | POA: Diagnosis not present

## 2019-07-21 DIAGNOSIS — R488 Other symbolic dysfunctions: Secondary | ICD-10-CM | POA: Diagnosis not present

## 2019-07-21 DIAGNOSIS — M6281 Muscle weakness (generalized): Secondary | ICD-10-CM | POA: Diagnosis not present

## 2019-07-21 DIAGNOSIS — R262 Difficulty in walking, not elsewhere classified: Secondary | ICD-10-CM | POA: Diagnosis not present

## 2019-07-21 DIAGNOSIS — R2681 Unsteadiness on feet: Secondary | ICD-10-CM | POA: Diagnosis not present

## 2019-07-21 DIAGNOSIS — R293 Abnormal posture: Secondary | ICD-10-CM | POA: Diagnosis not present

## 2019-07-21 DIAGNOSIS — F0390 Unspecified dementia without behavioral disturbance: Secondary | ICD-10-CM | POA: Diagnosis not present

## 2019-07-22 DIAGNOSIS — R293 Abnormal posture: Secondary | ICD-10-CM | POA: Diagnosis not present

## 2019-07-22 DIAGNOSIS — M6281 Muscle weakness (generalized): Secondary | ICD-10-CM | POA: Diagnosis not present

## 2019-07-22 DIAGNOSIS — R2681 Unsteadiness on feet: Secondary | ICD-10-CM | POA: Diagnosis not present

## 2019-07-22 DIAGNOSIS — R262 Difficulty in walking, not elsewhere classified: Secondary | ICD-10-CM | POA: Diagnosis not present

## 2019-07-22 DIAGNOSIS — R488 Other symbolic dysfunctions: Secondary | ICD-10-CM | POA: Diagnosis not present

## 2019-07-22 DIAGNOSIS — F0390 Unspecified dementia without behavioral disturbance: Secondary | ICD-10-CM | POA: Diagnosis not present

## 2019-07-23 DIAGNOSIS — R488 Other symbolic dysfunctions: Secondary | ICD-10-CM | POA: Diagnosis not present

## 2019-07-23 DIAGNOSIS — F0390 Unspecified dementia without behavioral disturbance: Secondary | ICD-10-CM | POA: Diagnosis not present

## 2019-07-23 DIAGNOSIS — R262 Difficulty in walking, not elsewhere classified: Secondary | ICD-10-CM | POA: Diagnosis not present

## 2019-07-23 DIAGNOSIS — M6281 Muscle weakness (generalized): Secondary | ICD-10-CM | POA: Diagnosis not present

## 2019-07-23 DIAGNOSIS — R2681 Unsteadiness on feet: Secondary | ICD-10-CM | POA: Diagnosis not present

## 2019-07-23 DIAGNOSIS — Z23 Encounter for immunization: Secondary | ICD-10-CM | POA: Diagnosis not present

## 2019-07-23 DIAGNOSIS — R293 Abnormal posture: Secondary | ICD-10-CM | POA: Diagnosis not present

## 2019-07-29 DIAGNOSIS — E1140 Type 2 diabetes mellitus with diabetic neuropathy, unspecified: Secondary | ICD-10-CM | POA: Diagnosis not present

## 2019-07-29 DIAGNOSIS — R269 Unspecified abnormalities of gait and mobility: Secondary | ICD-10-CM | POA: Diagnosis not present

## 2019-07-29 DIAGNOSIS — R4701 Aphasia: Secondary | ICD-10-CM | POA: Diagnosis not present

## 2019-07-29 DIAGNOSIS — M6281 Muscle weakness (generalized): Secondary | ICD-10-CM | POA: Diagnosis not present

## 2019-07-29 DIAGNOSIS — R32 Unspecified urinary incontinence: Secondary | ICD-10-CM | POA: Diagnosis not present

## 2019-07-29 DIAGNOSIS — R41841 Cognitive communication deficit: Secondary | ICD-10-CM | POA: Diagnosis not present

## 2019-07-29 DIAGNOSIS — K219 Gastro-esophageal reflux disease without esophagitis: Secondary | ICD-10-CM | POA: Diagnosis not present

## 2019-07-29 DIAGNOSIS — F0280 Dementia in other diseases classified elsewhere without behavioral disturbance: Secondary | ICD-10-CM | POA: Diagnosis not present

## 2019-07-29 DIAGNOSIS — I10 Essential (primary) hypertension: Secondary | ICD-10-CM | POA: Diagnosis not present

## 2019-07-29 DIAGNOSIS — M419 Scoliosis, unspecified: Secondary | ICD-10-CM | POA: Diagnosis not present

## 2019-07-29 DIAGNOSIS — G309 Alzheimer's disease, unspecified: Secondary | ICD-10-CM | POA: Diagnosis not present

## 2019-08-28 DIAGNOSIS — R41841 Cognitive communication deficit: Secondary | ICD-10-CM | POA: Diagnosis not present

## 2019-08-28 DIAGNOSIS — I10 Essential (primary) hypertension: Secondary | ICD-10-CM | POA: Diagnosis not present

## 2019-08-28 DIAGNOSIS — R32 Unspecified urinary incontinence: Secondary | ICD-10-CM | POA: Diagnosis not present

## 2019-08-28 DIAGNOSIS — R269 Unspecified abnormalities of gait and mobility: Secondary | ICD-10-CM | POA: Diagnosis not present

## 2019-08-28 DIAGNOSIS — K219 Gastro-esophageal reflux disease without esophagitis: Secondary | ICD-10-CM | POA: Diagnosis not present

## 2019-08-28 DIAGNOSIS — M419 Scoliosis, unspecified: Secondary | ICD-10-CM | POA: Diagnosis not present

## 2019-08-28 DIAGNOSIS — R4701 Aphasia: Secondary | ICD-10-CM | POA: Diagnosis not present

## 2019-08-28 DIAGNOSIS — E1140 Type 2 diabetes mellitus with diabetic neuropathy, unspecified: Secondary | ICD-10-CM | POA: Diagnosis not present

## 2019-08-28 DIAGNOSIS — G309 Alzheimer's disease, unspecified: Secondary | ICD-10-CM | POA: Diagnosis not present

## 2019-08-28 DIAGNOSIS — M6281 Muscle weakness (generalized): Secondary | ICD-10-CM | POA: Diagnosis not present

## 2019-08-28 DIAGNOSIS — F0280 Dementia in other diseases classified elsewhere without behavioral disturbance: Secondary | ICD-10-CM | POA: Diagnosis not present

## 2019-09-01 DIAGNOSIS — R269 Unspecified abnormalities of gait and mobility: Secondary | ICD-10-CM | POA: Diagnosis not present

## 2019-09-01 DIAGNOSIS — M6281 Muscle weakness (generalized): Secondary | ICD-10-CM | POA: Diagnosis not present

## 2019-09-01 DIAGNOSIS — G309 Alzheimer's disease, unspecified: Secondary | ICD-10-CM | POA: Diagnosis not present

## 2019-09-01 DIAGNOSIS — M419 Scoliosis, unspecified: Secondary | ICD-10-CM | POA: Diagnosis not present

## 2019-09-01 DIAGNOSIS — I10 Essential (primary) hypertension: Secondary | ICD-10-CM | POA: Diagnosis not present

## 2019-09-01 DIAGNOSIS — E1140 Type 2 diabetes mellitus with diabetic neuropathy, unspecified: Secondary | ICD-10-CM | POA: Diagnosis not present

## 2020-07-31 IMAGING — CT CT ABD-PELV W/ CM
3 of 5 series · 16 of 46 positions shown, 18 images · IV contrast (omnipaque)
Comparison: Radiograph earlier this day. CT 06/19/2016

CLINICAL DATA: Acute abdominal pain. Nausea.

EXAM:
CT ABDOMEN AND PELVIS WITH CONTRAST
TECHNIQUE: Multidetector CT imaging of the abdomen and pelvis was performed
using the standard protocol following bolus administration of
intravenous contrast.
CONTRAST:  100mL OMNIPAQUE IOHEXOL 300 MG/ML  SOLN

[Series 3: abdomen 5.0 · axial · 0.83mm/px · z∈[+841,+1241]mm · 11 of 97 slices shown, 13 images]
[im 9/97  soft-tissue]
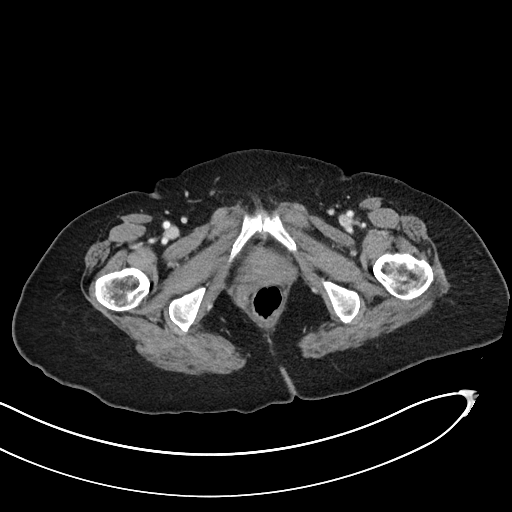
[im 9/97  bone]
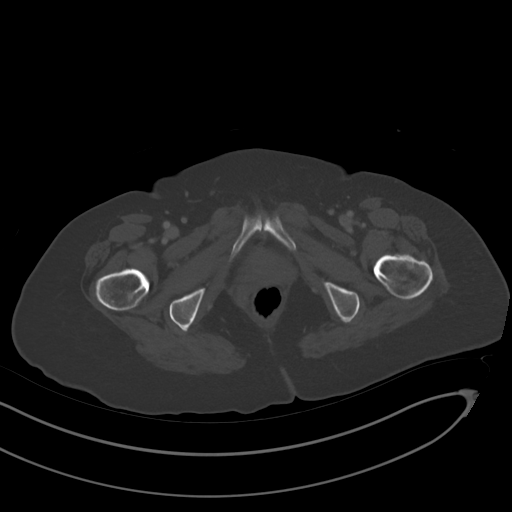
[im 17/97  soft-tissue]
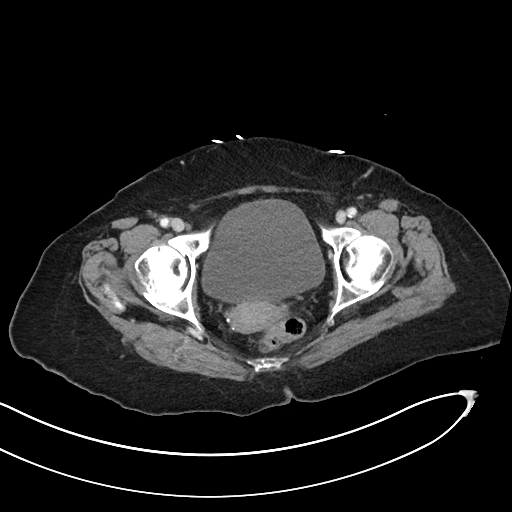
[im 25/97  soft-tissue]
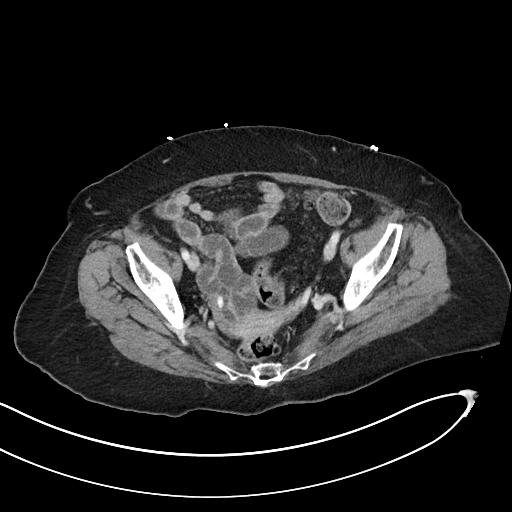
[im 33/97  soft-tissue]
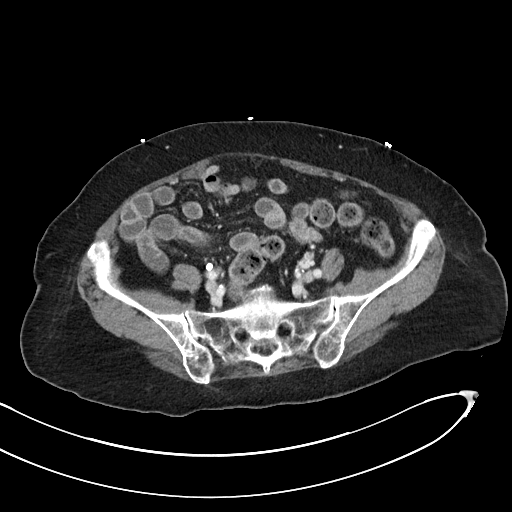
[im 41/97  soft-tissue]
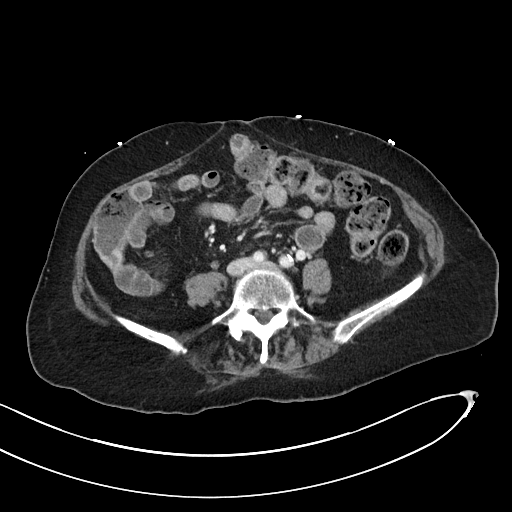
[im 49/97  soft-tissue]
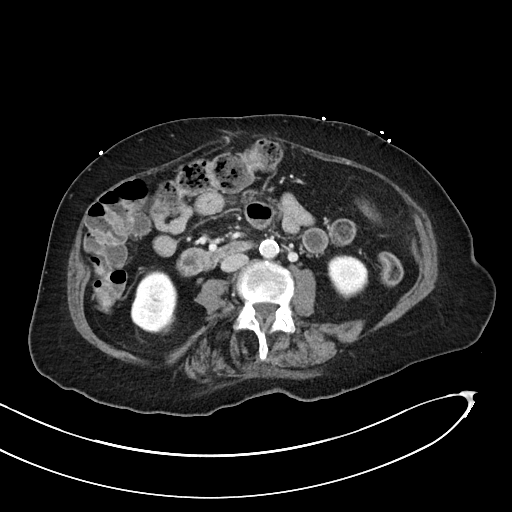
[im 57/97  soft-tissue]
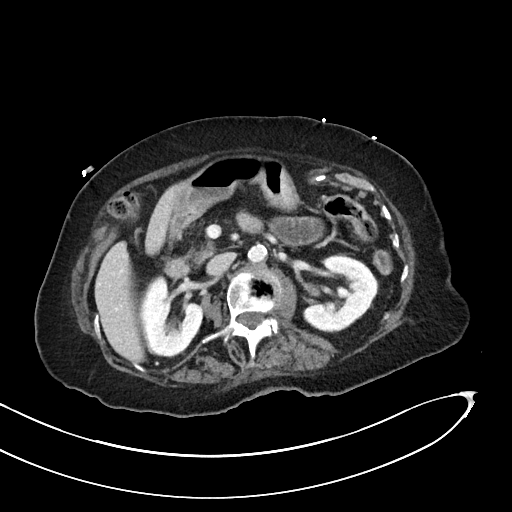
[im 65/97  soft-tissue]
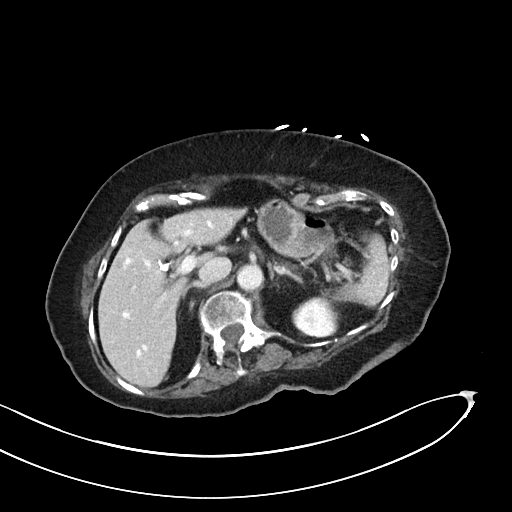
[im 73/97  soft-tissue]
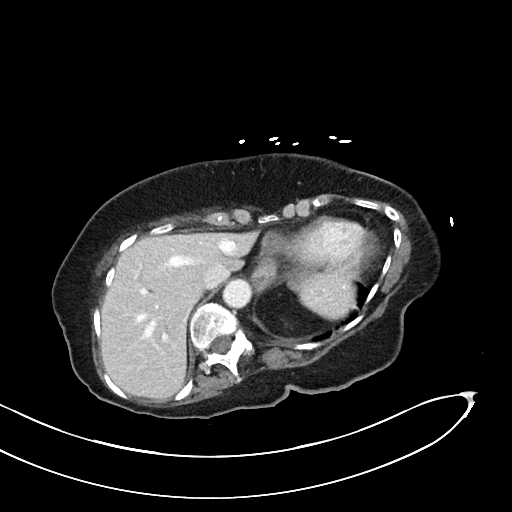
[im 73/97  bone]
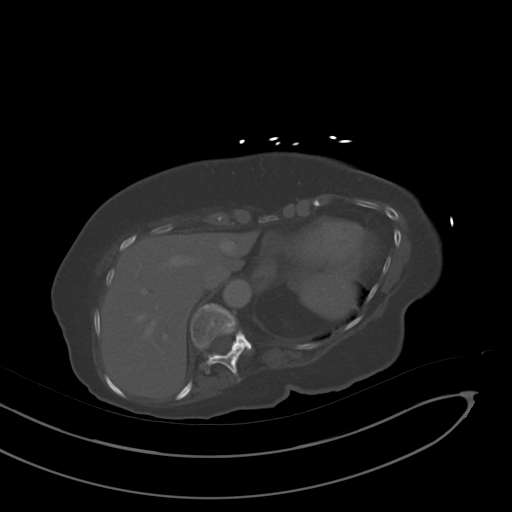
[im 81/97  soft-tissue]
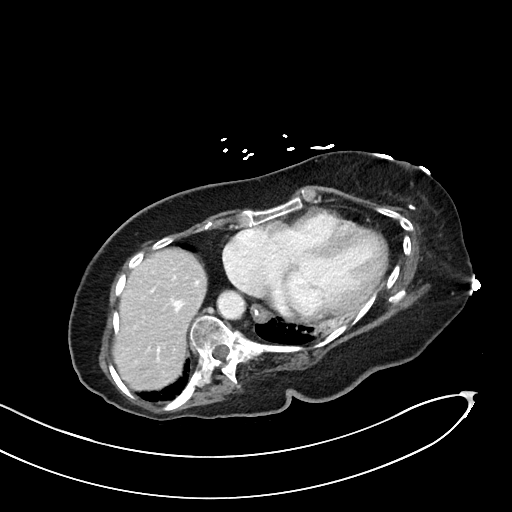
[im 89/97  soft-tissue]
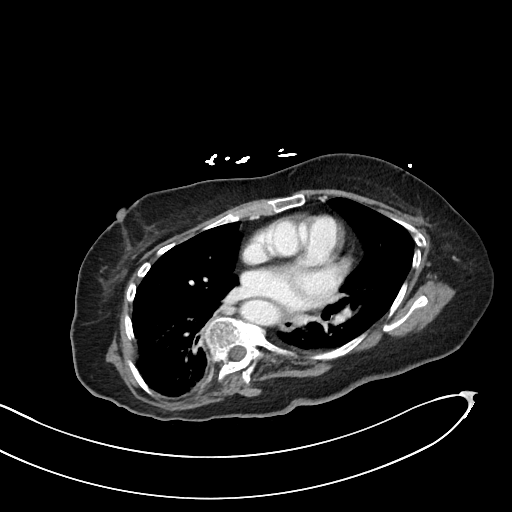

[Series 5: lung · axial · 0.83mm/px · z∈[+1065,+1097]mm · 2 of 117 slices shown]
[im 9/117  bone]
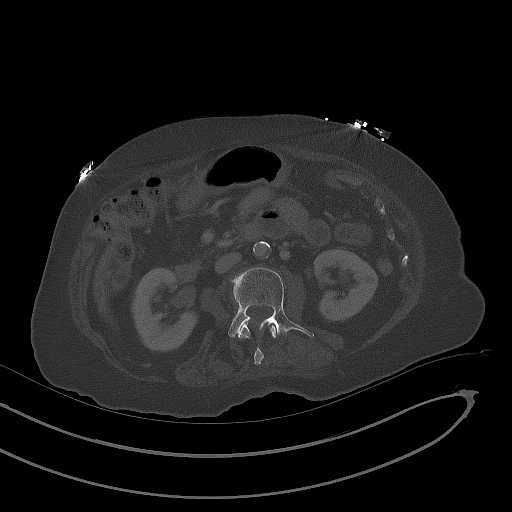
[im 25/117  bone]
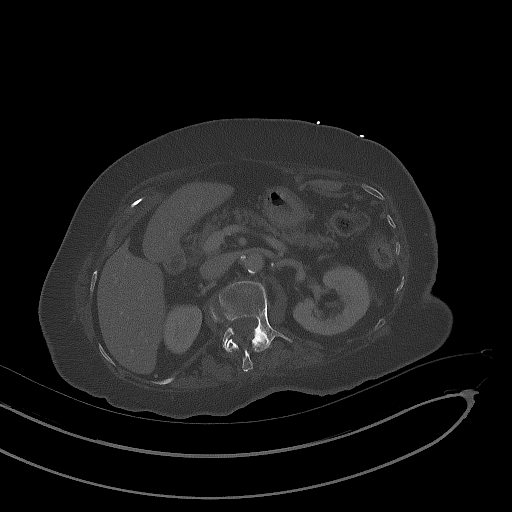

[Series 6: abdomen 3.0 mpr cor · coronal · 0.73mm/px · 3 of 76 slices shown]
[im 26/76  soft-tissue]
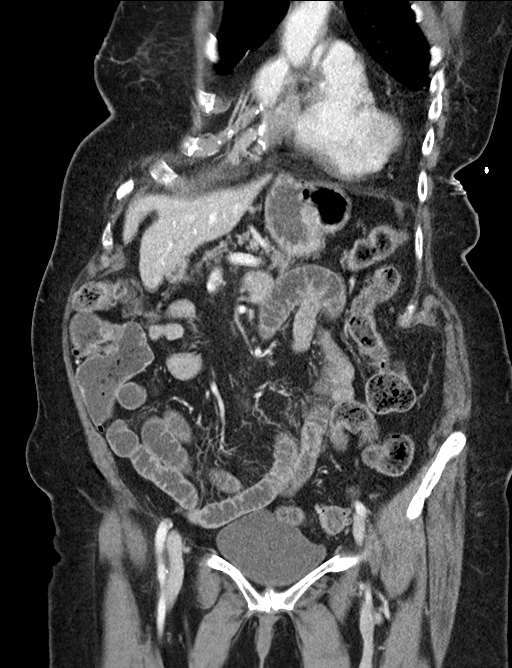
[im 34/76  soft-tissue]
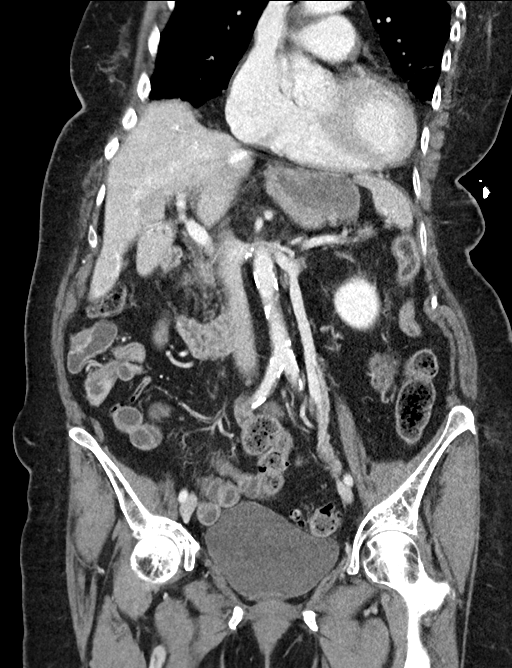
[im 42/76  soft-tissue]
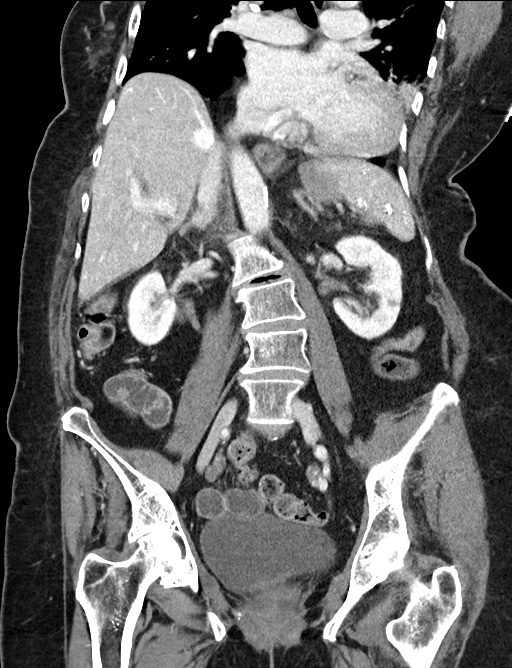

[16 of 46 positions shown; findings below may reference images not displayed]

FINDINGS: Lower chest: Scoliotic curvature distorts normal anatomy. Bilateral
lower lobe atelectasis. No pleural fluid.

Hepatobiliary: Calcified granuloma without focal lesion. Clips in
the gallbladder fossa postcholecystectomy. No biliary dilatation.

Pancreas: Parenchymal atrophy. No ductal dilatation or inflammation.

Spleen: Calcified granuloma.

Adrenals/Urinary Tract: Normal adrenal glands. No hydronephrosis or
perinephric edema. Homogeneous renal enhancement with symmetric
excretion on delayed phase imaging. Simple cysts in the upper and
mid left kidney. Urinary bladder is physiologically distended
without wall thickening.

Stomach/Bowel: Small hiatal hernia. Stomach is partially distended.
Motion artifact limits detailed assessment. No bowel wall thickening
or inflammatory change. No evidence of obstruction. Normal appendix.
Moderate stool burden in the colon. Mild diverticulosis of the
distal colon without diverticulitis.

Vascular/Lymphatic: Prominent left adnexal vascularity. Dilatation
of the left ovarian vein at 8 mm. Aortic atherosclerosis. Portal
vein and mesenteric vessels are patent. No adenopathy.

Reproductive: Prominent left adnexal vascularity and dilatation of
the ovarian pain. Coarse calcification in the right adnexa,
unchanged. Uterus is unremarkable.

Other: Laxity of the anterior abdominal wall musculature. No free
air, free fluid, or intra-abdominal fluid collection.

Musculoskeletal: Scoliosis and degenerative change throughout the
spine. There are no acute or suspicious osseous abnormalities.
IMPRESSION: 1. No acute findings.
2. Prominent left adnexal vascularity and dilatation of the ovarian
vein, can be seen with pelvic congestion syndrome.
3. Mild distal colonic diverticulosis without diverticulitis.
4.  Aortic Atherosclerosis ([id number]).

## 2020-10-01 DEATH — deceased
